# Patient Record
Sex: Female | Born: 1965 | Race: Black or African American | Hispanic: No | Marital: Single | Smoking: Current some day smoker
Health system: Southern US, Community
[De-identification: ages and names within clinical notes are randomized; demographics above are authoritative.]

## PROBLEM LIST (undated history)

## (undated) DIAGNOSIS — D259 Leiomyoma of uterus, unspecified: Secondary | ICD-10-CM

## (undated) DIAGNOSIS — I7100 Dissection of unspecified site of aorta: Secondary | ICD-10-CM

## (undated) DIAGNOSIS — I5032 Chronic diastolic (congestive) heart failure: Secondary | ICD-10-CM

## (undated) DIAGNOSIS — I10 Essential (primary) hypertension: Secondary | ICD-10-CM

## (undated) HISTORY — DX: Chronic diastolic (congestive) heart failure: I50.32

## (undated) HISTORY — DX: Dissection of unspecified site of aorta: I71.00

---

## 2014-11-03 ENCOUNTER — Emergency Department (HOSPITAL_COMMUNITY): Payer: Medicaid - Out of State

## 2014-11-03 ENCOUNTER — Encounter (HOSPITAL_COMMUNITY): Payer: Self-pay | Admitting: Nurse Practitioner

## 2014-11-03 ENCOUNTER — Emergency Department (HOSPITAL_COMMUNITY): Payer: Medicaid - Out of State | Admitting: Anesthesiology

## 2014-11-03 ENCOUNTER — Encounter (HOSPITAL_COMMUNITY)
Admission: EM | Disposition: A | Discharge status: Home / Self Care | Payer: 59 | Source: Home / Self Care | Attending: Cardiothoracic Surgery

## 2014-11-03 ENCOUNTER — Inpatient Hospital Stay (HOSPITAL_COMMUNITY)
Admission: EM | Admit: 2014-11-03 | Discharge: 2014-11-12 | DRG: 220 | Disposition: A | Discharge status: Home / Self Care | Payer: Medicaid - Out of State | Attending: Cardiothoracic Surgery | Admitting: Cardiothoracic Surgery

## 2014-11-03 DIAGNOSIS — D696 Thrombocytopenia, unspecified: Secondary | ICD-10-CM | POA: Diagnosis present

## 2014-11-03 DIAGNOSIS — I7101 Dissection of thoracic aorta: Secondary | ICD-10-CM

## 2014-11-03 DIAGNOSIS — I711 Thoracic aortic aneurysm, ruptured: Secondary | ICD-10-CM

## 2014-11-03 DIAGNOSIS — F17210 Nicotine dependence, cigarettes, uncomplicated: Secondary | ICD-10-CM | POA: Diagnosis present

## 2014-11-03 DIAGNOSIS — I7100 Dissection of unspecified site of aorta: Secondary | ICD-10-CM

## 2014-11-03 DIAGNOSIS — E119 Type 2 diabetes mellitus without complications: Secondary | ICD-10-CM | POA: Diagnosis present

## 2014-11-03 DIAGNOSIS — R109 Unspecified abdominal pain: Secondary | ICD-10-CM

## 2014-11-03 DIAGNOSIS — Z95828 Presence of other vascular implants and grafts: Secondary | ICD-10-CM

## 2014-11-03 DIAGNOSIS — Z6841 Body Mass Index (BMI) 40.0 and over, adult: Secondary | ICD-10-CM

## 2014-11-03 DIAGNOSIS — I7102 Dissection of abdominal aorta: Secondary | ICD-10-CM | POA: Diagnosis not present

## 2014-11-03 DIAGNOSIS — D259 Leiomyoma of uterus, unspecified: Secondary | ICD-10-CM | POA: Diagnosis present

## 2014-11-03 DIAGNOSIS — R1900 Intra-abdominal and pelvic swelling, mass and lump, unspecified site: Secondary | ICD-10-CM | POA: Diagnosis present

## 2014-11-03 DIAGNOSIS — R52 Pain, unspecified: Secondary | ICD-10-CM

## 2014-11-03 DIAGNOSIS — Z8249 Family history of ischemic heart disease and other diseases of the circulatory system: Secondary | ICD-10-CM

## 2014-11-03 DIAGNOSIS — E8770 Fluid overload, unspecified: Secondary | ICD-10-CM | POA: Diagnosis not present

## 2014-11-03 DIAGNOSIS — D62 Acute posthemorrhagic anemia: Secondary | ICD-10-CM | POA: Diagnosis not present

## 2014-11-03 DIAGNOSIS — I71019 Dissection of thoracic aorta, unspecified: Secondary | ICD-10-CM | POA: Diagnosis present

## 2014-11-03 DIAGNOSIS — I10 Essential (primary) hypertension: Secondary | ICD-10-CM | POA: Diagnosis present

## 2014-11-03 DIAGNOSIS — K5900 Constipation, unspecified: Secondary | ICD-10-CM | POA: Diagnosis not present

## 2014-11-03 DIAGNOSIS — J9811 Atelectasis: Secondary | ICD-10-CM | POA: Diagnosis not present

## 2014-11-03 DIAGNOSIS — Z9114 Patient's other noncompliance with medication regimen: Secondary | ICD-10-CM | POA: Diagnosis present

## 2014-11-03 HISTORY — DX: Leiomyoma of uterus, unspecified: D25.9

## 2014-11-03 HISTORY — PX: REPLACEMENT ASCENDING AORTA: SHX6068

## 2014-11-03 HISTORY — DX: Essential (primary) hypertension: I10

## 2014-11-03 LAB — URINALYSIS, ROUTINE W REFLEX MICROSCOPIC
Bilirubin Urine: NEGATIVE
Glucose, UA: NEGATIVE mg/dL
Ketones, ur: NEGATIVE mg/dL
LEUKOCYTES UA: NEGATIVE
Nitrite: NEGATIVE
PROTEIN: 30 mg/dL — AB
Specific Gravity, Urine: 1.02 (ref 1.005–1.030)
UROBILINOGEN UA: 0.2 mg/dL (ref 0.0–1.0)
pH: 7 (ref 5.0–8.0)

## 2014-11-03 LAB — HEMOGLOBIN AND HEMATOCRIT, BLOOD
HCT: 27 % — ABNORMAL LOW (ref 36.0–46.0)
Hemoglobin: 9.2 g/dL — ABNORMAL LOW (ref 12.0–15.0)

## 2014-11-03 LAB — CBC WITH DIFFERENTIAL/PLATELET
Basophils Absolute: 0 10*3/uL (ref 0.0–0.1)
Basophils Relative: 0 % (ref 0–1)
EOS ABS: 0 10*3/uL (ref 0.0–0.7)
EOS PCT: 0 % (ref 0–5)
HCT: 38.1 % (ref 36.0–46.0)
Hemoglobin: 12.9 g/dL (ref 12.0–15.0)
LYMPHS ABS: 1.1 10*3/uL (ref 0.7–4.0)
Lymphocytes Relative: 10 % — ABNORMAL LOW (ref 12–46)
MCH: 29.4 pg (ref 26.0–34.0)
MCHC: 33.9 g/dL (ref 30.0–36.0)
MCV: 86.8 fL (ref 78.0–100.0)
Monocytes Absolute: 0.4 10*3/uL (ref 0.1–1.0)
Monocytes Relative: 4 % (ref 3–12)
Neutro Abs: 9.5 10*3/uL — ABNORMAL HIGH (ref 1.7–7.7)
Neutrophils Relative %: 86 % — ABNORMAL HIGH (ref 43–77)
Platelets: 297 10*3/uL (ref 150–400)
RBC: 4.39 MIL/uL (ref 3.87–5.11)
RDW: 16.3 % — ABNORMAL HIGH (ref 11.5–15.5)
WBC: 11 10*3/uL — ABNORMAL HIGH (ref 4.0–10.5)

## 2014-11-03 LAB — COMPREHENSIVE METABOLIC PANEL
ALK PHOS: 74 U/L (ref 39–117)
ALT: 14 U/L (ref 0–35)
ANION GAP: 13 (ref 5–15)
AST: 17 U/L (ref 0–37)
Albumin: 4.2 g/dL (ref 3.5–5.2)
BILIRUBIN TOTAL: 0.3 mg/dL (ref 0.3–1.2)
BUN: 16 mg/dL (ref 6–23)
CHLORIDE: 103 mmol/L (ref 96–112)
CO2: 22 mmol/L (ref 19–32)
Calcium: 9.3 mg/dL (ref 8.4–10.5)
Creatinine, Ser: 1.09 mg/dL (ref 0.50–1.10)
GFR calc Af Amer: 68 mL/min — ABNORMAL LOW (ref >90)
GFR, EST NON AFRICAN AMERICAN: 59 mL/min — AB (ref >90)
Glucose, Bld: 149 mg/dL — ABNORMAL HIGH (ref 70–99)
POTASSIUM: 3.2 mmol/L — AB (ref 3.5–5.1)
Sodium: 138 mmol/L (ref 135–145)
Total Protein: 7.8 g/dL (ref 6.0–8.3)

## 2014-11-03 LAB — URINE MICROSCOPIC-ADD ON

## 2014-11-03 LAB — FIBRINOGEN: Fibrinogen: 201 mg/dL — ABNORMAL LOW (ref 204–475)

## 2014-11-03 LAB — I-STAT CHEM 8, ED
BUN: 25 mg/dL — AB (ref 6–23)
CHLORIDE: 100 mmol/L (ref 96–112)
Calcium, Ion: 1.18 mmol/L (ref 1.12–1.23)
Creatinine, Ser: 0.9 mg/dL (ref 0.50–1.10)
Glucose, Bld: 144 mg/dL — ABNORMAL HIGH (ref 70–99)
HCT: 43 % (ref 36.0–46.0)
Hemoglobin: 14.6 g/dL (ref 12.0–15.0)
POTASSIUM: 3.2 mmol/L — AB (ref 3.5–5.1)
SODIUM: 141 mmol/L (ref 135–145)
TCO2: 18 mmol/L (ref 0–100)

## 2014-11-03 LAB — I-STAT CG4 LACTIC ACID, ED: Lactic Acid, Venous: 2.3 mmol/L (ref 0.5–2.0)

## 2014-11-03 LAB — I-STAT TROPONIN, ED: Troponin i, poc: 0.01 ng/mL (ref 0.00–0.08)

## 2014-11-03 LAB — PREPARE RBC (CROSSMATCH)

## 2014-11-03 LAB — ABO/RH: ABO/RH(D): B POS

## 2014-11-03 LAB — PLATELET COUNT: Platelets: 152 10*3/uL (ref 150–400)

## 2014-11-03 LAB — LIPASE, BLOOD: Lipase: 41 U/L (ref 11–59)

## 2014-11-03 SURGERY — REPLACEMENT, AORTA, ASCENDING
Anesthesia: General | Site: Chest

## 2014-11-03 MED ORDER — NITROGLYCERIN IN D5W 200-5 MCG/ML-% IV SOLN
INTRAVENOUS | Status: DC | PRN
  Administered 2014-11-03: 5 ug/min via INTRAVENOUS

## 2014-11-03 MED ORDER — LIDOCAINE HCL (CARDIAC) 20 MG/ML IV SOLN
INTRAVENOUS | Status: DC | PRN
  Administered 2014-11-03: 50 mg via INTRAVENOUS

## 2014-11-03 MED ORDER — SODIUM CHLORIDE 0.9 % IV SOLN
10.0000 g | INTRAVENOUS | Status: DC | PRN
  Administered 2014-11-03: 5 g/h via INTRAVENOUS

## 2014-11-03 MED ORDER — DOPAMINE-DEXTROSE 3.2-5 MG/ML-% IV SOLN
0.0000 ug/kg/min | INTRAVENOUS | Status: DC
  Filled 2014-11-03: qty 250

## 2014-11-03 MED ORDER — DEXTROSE 5 % IV SOLN
1.5000 g | INTRAVENOUS | Status: AC
  Administered 2014-11-03: 1.5 g via INTRAVENOUS
  Administered 2014-11-03: .75 g via INTRAVENOUS
  Filled 2014-11-03: qty 1.5

## 2014-11-03 MED ORDER — LACTATED RINGERS IV SOLN
INTRAVENOUS | Status: DC | PRN
  Administered 2014-11-03 (×4): via INTRAVENOUS

## 2014-11-03 MED ORDER — EPHEDRINE SULFATE 50 MG/ML IJ SOLN
INTRAMUSCULAR | Status: AC
  Filled 2014-11-03: qty 1

## 2014-11-03 MED ORDER — LACTATED RINGERS IV SOLN
INTRAVENOUS | Status: DC | PRN
  Administered 2014-11-03 (×2): via INTRAVENOUS

## 2014-11-03 MED ORDER — ROCURONIUM BROMIDE 50 MG/5ML IV SOLN
INTRAVENOUS | Status: AC
  Filled 2014-11-03: qty 5

## 2014-11-03 MED ORDER — LABETALOL HCL 5 MG/ML IV SOLN
2.0000 mg/min | INTRAVENOUS | Status: DC
  Administered 2014-11-03: 2 mg/min via INTRAVENOUS
  Filled 2014-11-03: qty 100

## 2014-11-03 MED ORDER — VECURONIUM BROMIDE 10 MG IV SOLR: INTRAVENOUS | Status: DC | PRN

## 2014-11-03 MED ORDER — IOHEXOL 300 MG/ML  SOLN
100.0000 mL | Freq: Once | INTRAMUSCULAR | Status: AC | PRN
Start: 2014-11-03 — End: 2014-11-03
  Administered 2014-11-03: 100 mL via INTRAVENOUS

## 2014-11-03 MED ORDER — HEPARIN SODIUM (PORCINE) 1000 UNIT/ML IJ SOLN
INTRAMUSCULAR | Status: AC
  Filled 2014-11-03: qty 1

## 2014-11-03 MED ORDER — MIDAZOLAM HCL 10 MG/2ML IJ SOLN
INTRAMUSCULAR | Status: AC
  Filled 2014-11-03: qty 2

## 2014-11-03 MED ORDER — HEPARIN SODIUM (PORCINE) 1000 UNIT/ML IJ SOLN
INTRAMUSCULAR | Status: DC | PRN
  Administered 2014-11-03: 40 mL via INTRAVENOUS

## 2014-11-03 MED ORDER — SODIUM CHLORIDE 0.9 % IV SOLN
INTRAVENOUS | Status: DC
  Filled 2014-11-03: qty 30

## 2014-11-03 MED ORDER — LABETALOL HCL 5 MG/ML IV SOLN
20.0000 mg | Freq: Once | INTRAVENOUS | Status: AC
  Administered 2014-11-03: 20 mg via INTRAVENOUS
  Filled 2014-11-03: qty 4

## 2014-11-03 MED ORDER — IOHEXOL 350 MG/ML SOLN
100.0000 mL | Freq: Once | INTRAVENOUS | Status: AC | PRN
  Administered 2014-11-03: 100 mL via INTRAVENOUS

## 2014-11-03 MED ORDER — MAGNESIUM SULFATE 50 % IJ SOLN
40.0000 meq | INTRAMUSCULAR | Status: DC
  Filled 2014-11-03: qty 10

## 2014-11-03 MED ORDER — VANCOMYCIN HCL 10 G IV SOLR
1500.0000 mg | INTRAVENOUS | Status: AC
  Administered 2014-11-03: 1500 mg via INTRAVENOUS
  Filled 2014-11-03: qty 1500

## 2014-11-03 MED ORDER — ETOMIDATE 2 MG/ML IV SOLN
INTRAVENOUS | Status: AC
  Filled 2014-11-03: qty 10

## 2014-11-03 MED ORDER — HEPARIN SODIUM (PORCINE) 1000 UNIT/ML IJ SOLN
INTRAMUSCULAR | Status: AC
  Filled 2014-11-03: qty 4

## 2014-11-03 MED ORDER — COAGULATION FACTOR VIIA RECOMB 1 MG IV SOLR
45.0000 ug/kg | Freq: Once | INTRAVENOUS | Status: DC
  Filled 2014-11-03: qty 5

## 2014-11-03 MED ORDER — MIDAZOLAM HCL 5 MG/5ML IJ SOLN
INTRAMUSCULAR | Status: DC | PRN
  Administered 2014-11-03: 2 mg via INTRAVENOUS
  Administered 2014-11-03: 0.5 mg via INTRAVENOUS
  Administered 2014-11-03 (×3): 2 mg via INTRAVENOUS
  Administered 2014-11-03: 0.5 mg via INTRAVENOUS
  Administered 2014-11-03: 1 mg via INTRAVENOUS

## 2014-11-03 MED ORDER — EPINEPHRINE HCL 1 MG/ML IJ SOLN
0.0000 ug/min | INTRAVENOUS | Status: DC
  Filled 2014-11-03: qty 4

## 2014-11-03 MED ORDER — SODIUM CHLORIDE 0.9 % IJ SOLN
INTRAMUSCULAR | Status: AC
  Filled 2014-11-03: qty 30

## 2014-11-03 MED ORDER — DEXTROSE 5 % IV SOLN
30.0000 ug/min | INTRAVENOUS | Status: DC
  Filled 2014-11-03: qty 2

## 2014-11-03 MED ORDER — SODIUM CHLORIDE 0.9 % IV SOLN
250.0000 [IU] | INTRAVENOUS | Status: DC | PRN
  Administered 2014-11-03: 1 [IU]/h via INTRAVENOUS

## 2014-11-03 MED ORDER — PROPOFOL 10 MG/ML IV BOLUS
INTRAVENOUS | Status: AC
  Filled 2014-11-03: qty 20

## 2014-11-03 MED ORDER — PROTAMINE SULFATE 10 MG/ML IV SOLN
INTRAVENOUS | Status: AC
  Filled 2014-11-03: qty 25

## 2014-11-03 MED ORDER — DEXTROSE 5 % IV SOLN
750.0000 mg | INTRAVENOUS | Status: DC
  Filled 2014-11-03: qty 750

## 2014-11-03 MED ORDER — FENTANYL CITRATE (PF) 250 MCG/5ML IJ SOLN
INTRAMUSCULAR | Status: AC
  Filled 2014-11-03: qty 5

## 2014-11-03 MED ORDER — PHENYLEPHRINE 40 MCG/ML (10ML) SYRINGE FOR IV PUSH (FOR BLOOD PRESSURE SUPPORT)
PREFILLED_SYRINGE | INTRAVENOUS | Status: AC
  Filled 2014-11-03: qty 10

## 2014-11-03 MED ORDER — HYDROMORPHONE HCL 1 MG/ML IJ SOLN
1.0000 mg | Freq: Once | INTRAMUSCULAR | Status: AC
  Administered 2014-11-03: 1 mg via INTRAVENOUS
  Filled 2014-11-03: qty 1

## 2014-11-03 MED ORDER — SODIUM CHLORIDE 0.9 % IV SOLN
INTRAVENOUS | Status: DC
  Filled 2014-11-03: qty 2.5

## 2014-11-03 MED ORDER — SUCCINYLCHOLINE CHLORIDE 20 MG/ML IJ SOLN
INTRAMUSCULAR | Status: AC
  Filled 2014-11-03: qty 1

## 2014-11-03 MED ORDER — NITROGLYCERIN IN D5W 200-5 MCG/ML-% IV SOLN
2.0000 ug/min | INTRAVENOUS | Status: DC
  Filled 2014-11-03: qty 250

## 2014-11-03 MED ORDER — DOPAMINE-DEXTROSE 3.2-5 MG/ML-% IV SOLN
INTRAVENOUS | Status: DC | PRN
  Administered 2014-11-03: 5 ug/kg/min via INTRAVENOUS

## 2014-11-03 MED ORDER — DEXAMETHASONE SODIUM PHOSPHATE 4 MG/ML IJ SOLN
INTRAMUSCULAR | Status: DC | PRN
  Administered 2014-11-03: 10 mg via INTRAVENOUS

## 2014-11-03 MED ORDER — ROCURONIUM BROMIDE 100 MG/10ML IV SOLN
INTRAVENOUS | Status: DC | PRN
  Administered 2014-11-03: 50 mg via INTRAVENOUS
  Administered 2014-11-03: 100 mg via INTRAVENOUS
  Administered 2014-11-03 (×2): 50 mg via INTRAVENOUS

## 2014-11-03 MED ORDER — STERILE WATER FOR INJECTION IJ SOLN
INTRAMUSCULAR | Status: AC
  Filled 2014-11-03: qty 30

## 2014-11-03 MED ORDER — ALBUMIN HUMAN 5 % IV SOLN
INTRAVENOUS | Status: DC | PRN
  Administered 2014-11-03: 23:00:00 via INTRAVENOUS

## 2014-11-03 MED ORDER — SODIUM CHLORIDE 0.9 % IV SOLN: Freq: Once | INTRAVENOUS | Status: DC

## 2014-11-03 MED ORDER — PROPOFOL 10 MG/ML IV BOLUS
INTRAVENOUS | Status: DC | PRN
  Administered 2014-11-03: 100 mg via INTRAVENOUS

## 2014-11-03 MED ORDER — PROTAMINE SULFATE 10 MG/ML IV SOLN
INTRAVENOUS | Status: AC
  Filled 2014-11-03: qty 10

## 2014-11-03 MED ORDER — HEMOSTATIC AGENTS (NO CHARGE) OPTIME
TOPICAL | Status: DC | PRN
  Administered 2014-11-03: 1 via TOPICAL

## 2014-11-03 MED ORDER — SODIUM CHLORIDE 0.9 % IV SOLN
INTRAVENOUS | Status: DC
  Filled 2014-11-03: qty 40

## 2014-11-03 MED ORDER — PROTAMINE SULFATE 10 MG/ML IV SOLN
INTRAVENOUS | Status: DC | PRN
  Administered 2014-11-03: 350 mg via INTRAVENOUS

## 2014-11-03 MED ORDER — SODIUM CHLORIDE 0.9 % IV SOLN
200.0000 ug | INTRAVENOUS | Status: DC | PRN
  Administered 2014-11-03: 0.2 ug/kg/h via INTRAVENOUS

## 2014-11-03 MED ORDER — VECURONIUM BROMIDE 10 MG IV SOLR
INTRAVENOUS | Status: AC
  Filled 2014-11-03: qty 10

## 2014-11-03 MED ORDER — 0.9 % SODIUM CHLORIDE (POUR BTL) OPTIME
TOPICAL | Status: DC | PRN
  Administered 2014-11-03: 1000 mL

## 2014-11-03 MED ORDER — PHENYLEPHRINE HCL 10 MG/ML IJ SOLN
INTRAMUSCULAR | Status: AC
  Filled 2014-11-03: qty 1

## 2014-11-03 MED ORDER — LIDOCAINE HCL (CARDIAC) 20 MG/ML IV SOLN
INTRAVENOUS | Status: AC
  Filled 2014-11-03: qty 10

## 2014-11-03 MED ORDER — FENTANYL CITRATE (PF) 100 MCG/2ML IJ SOLN
INTRAMUSCULAR | Status: DC | PRN
  Administered 2014-11-03: 250 ug via INTRAVENOUS
  Administered 2014-11-03: 200 ug via INTRAVENOUS
  Administered 2014-11-03 (×2): 250 ug via INTRAVENOUS
  Administered 2014-11-03: 50 ug via INTRAVENOUS
  Administered 2014-11-03 – 2014-11-04 (×3): 250 ug via INTRAVENOUS

## 2014-11-03 MED ORDER — ONDANSETRON HCL 4 MG/2ML IJ SOLN
4.0000 mg | Freq: Once | INTRAMUSCULAR | Status: AC
  Administered 2014-11-03: 4 mg via INTRAVENOUS
  Filled 2014-11-03: qty 2

## 2014-11-03 MED ORDER — ETOMIDATE 2 MG/ML IV SOLN
INTRAVENOUS | Status: DC | PRN
  Administered 2014-11-03 (×2): 10 mg via INTRAVENOUS

## 2014-11-03 MED ORDER — LACTATED RINGERS IV SOLN
INTRAVENOUS | Status: DC | PRN
  Administered 2014-11-03: 18:00:00 via INTRAVENOUS

## 2014-11-03 MED ORDER — IOHEXOL 300 MG/ML  SOLN: 25.0000 mL | Freq: Once | INTRAMUSCULAR | Status: AC | PRN

## 2014-11-03 MED ORDER — SODIUM BICARBONATE 8.4 % IV SOLN
INTRAVENOUS | Status: DC | PRN
  Administered 2014-11-03: 50 meq via INTRAVENOUS

## 2014-11-03 MED ORDER — SUCCINYLCHOLINE CHLORIDE 20 MG/ML IJ SOLN
INTRAMUSCULAR | Status: DC | PRN
  Administered 2014-11-03: 120 mg via INTRAVENOUS

## 2014-11-03 MED ORDER — ARTIFICIAL TEARS OP OINT
TOPICAL_OINTMENT | OPHTHALMIC | Status: AC
  Filled 2014-11-03: qty 3.5

## 2014-11-03 MED ORDER — PAPAVERINE HCL 30 MG/ML IJ SOLN
INTRAMUSCULAR | Status: AC
Start: 2014-11-04 — End: 2014-11-03
  Administered 2014-11-03: 500 mL
  Filled 2014-11-03: qty 2.5

## 2014-11-03 MED ORDER — POTASSIUM CHLORIDE 2 MEQ/ML IV SOLN
80.0000 meq | INTRAVENOUS | Status: DC
  Filled 2014-11-03: qty 40

## 2014-11-03 MED ORDER — SODIUM CHLORIDE 0.9 % IV SOLN
1.0000 g/h | Freq: Once | INTRAVENOUS | Status: DC
  Filled 2014-11-03: qty 20

## 2014-11-03 MED ORDER — DEXMEDETOMIDINE HCL IN NACL 400 MCG/100ML IV SOLN
0.1000 ug/kg/h | INTRAVENOUS | Status: DC
  Filled 2014-11-03: qty 100

## 2014-11-03 SURGICAL SUPPLY — 122 items
ADAPTER CARDIO PERF ANTE/RETRO (ADAPTER) ×3 IMPLANT
BAG DECANTER FOR FLEXI CONT (MISCELLANEOUS) ×3 IMPLANT
BLADE STERNUM SYSTEM 6 (BLADE) ×3 IMPLANT
BLADE SURG 15 STRL LF DISP TIS (BLADE) ×2 IMPLANT
BLADE SURG 15 STRL SS (BLADE) ×1
BLADE SURG ROTATE 9660 (MISCELLANEOUS) IMPLANT
CANISTER SUCTION 2500CC (MISCELLANEOUS) ×3 IMPLANT
CANN PRFSN .5XCNCT 15X34-48 (MISCELLANEOUS)
CANNULA ARTERIAL 007325 (MISCELLANEOUS) IMPLANT
CANNULA ARTERIAL 14F 007324 (MISCELLANEOUS) IMPLANT
CANNULA GRAFT 8MMX50CM (Graft) ×3 IMPLANT
CANNULA GUNDRY RCSP 15FR (MISCELLANEOUS) ×3 IMPLANT
CANNULA PRFSN .5XCNCT 15X34-48 (MISCELLANEOUS) IMPLANT
CANNULA SUMP PERICARDIAL (CANNULA) ×3 IMPLANT
CANNULA VEN 2 STAGE (MISCELLANEOUS)
CATH CPB KIT GERHARDT (MISCELLANEOUS) ×3 IMPLANT
CATH FOLEY 2WAY SLVR 18FR 30CC (CATHETERS) ×3 IMPLANT
CATH HEART VENT LEFT (CATHETERS) ×2 IMPLANT
CATH RETROPLEGIA CORONARY 14FR (CATHETERS) ×3 IMPLANT
CATH THORACIC 28FR (CATHETERS) IMPLANT
CATH/SQUID NICHOLS JEHLE COR (CATHETERS) ×3 IMPLANT
CLIP TI MEDIUM 6 (CLIP) ×3 IMPLANT
CLIP TI WIDE RED SMALL 24 (CLIP) IMPLANT
CLIP TI WIDE RED SMALL 6 (CLIP) IMPLANT
CONN 1/2X1/2X1/2  BEN (MISCELLANEOUS)
CONN 1/2X1/2X1/2 BEN (MISCELLANEOUS) IMPLANT
CONN 3/8X3/8 GISH STERILE (MISCELLANEOUS) IMPLANT
CONN Y 3/8X3/8X3/8  BEN (MISCELLANEOUS)
CONN Y 3/8X3/8X3/8 BEN (MISCELLANEOUS) IMPLANT
CRADLE DONUT ADULT HEAD (MISCELLANEOUS) IMPLANT
DRAIN CHANNEL 15F RND FF W/TCR (WOUND CARE) IMPLANT
DRAIN CHANNEL 19F RND (DRAIN) IMPLANT
DRAIN CHANNEL 28F RND 3/8 FF (WOUND CARE) ×3 IMPLANT
DRAIN SNY 10X20 3/4 PERF (WOUND CARE) IMPLANT
DRAIN WOUND SNY 15 RND (WOUND CARE) IMPLANT
DRESSING AQUACEL AQ EXTRA 4X5 (GAUZE/BANDAGES/DRESSINGS) ×3 IMPLANT
DRSG AQUACEL AG ADV 3.5X14 (GAUZE/BANDAGES/DRESSINGS) ×3 IMPLANT
ELECT BLADE 4.0 EZ CLEAN MEGAD (MISCELLANEOUS) ×3
ELECT CAUTERY BLADE 6.4 (BLADE) ×3 IMPLANT
ELECT REM PT RETURN 9FT ADLT (ELECTROSURGICAL) ×6
ELECTRODE BLDE 4.0 EZ CLN MEGD (MISCELLANEOUS) ×2 IMPLANT
ELECTRODE REM PT RTRN 9FT ADLT (ELECTROSURGICAL) ×4 IMPLANT
EVACUATOR SILICONE 100CC (DRAIN) IMPLANT
FELT TEFLON 6X6 (MISCELLANEOUS) IMPLANT
GAUZE SPONGE 4X4 12PLY STRL (GAUZE/BANDAGES/DRESSINGS) ×6 IMPLANT
GLOVE BIO SURGEON STRL SZ 6.5 (GLOVE) ×9 IMPLANT
GOWN STRL REUS W/ TWL LRG LVL3 (GOWN DISPOSABLE) ×8 IMPLANT
GOWN STRL REUS W/TWL LRG LVL3 (GOWN DISPOSABLE) ×4
GRAFT 4 BRANCH 30X50 (Prosthesis & Implant Heart) ×3 IMPLANT
GUIDEWIRE PRIMEWIRE PRESTIGE (WIRE) ×3 IMPLANT
HANDLE STAPLE ENDO GIA SHORT (STAPLE) ×1
HEMOSTAT POWDER SURGIFOAM 1G (HEMOSTASIS) IMPLANT
HEMOSTAT SNOW SURGICEL 2X4 (HEMOSTASIS) ×3 IMPLANT
HEMOSTAT SURGICEL 2X14 (HEMOSTASIS) IMPLANT
HEMOSTAT SURGICEL 2X4 FIBR (HEMOSTASIS) ×3 IMPLANT
INSERT FOGARTY SM (MISCELLANEOUS) ×3 IMPLANT
INSERT FOGARTY XLG (MISCELLANEOUS) ×3 IMPLANT
KIT BASIN OR (CUSTOM PROCEDURE TRAY) ×3 IMPLANT
KIT ROOM TURNOVER OR (KITS) ×3 IMPLANT
KIT SUCTION CATH 14FR (SUCTIONS) IMPLANT
LEAD PACING MYOCARDI (MISCELLANEOUS) ×3 IMPLANT
NEEDLE AORTIC AIR ASPIRATING (NEEDLE) ×3 IMPLANT
NS IRRIG 1000ML POUR BTL (IV SOLUTION) ×12 IMPLANT
PACK OPEN HEART (CUSTOM PROCEDURE TRAY) ×3 IMPLANT
PAD ARMBOARD 7.5X6 YLW CONV (MISCELLANEOUS) ×6 IMPLANT
RELOAD ENDO GIA 30 3.5 (STAPLE) ×3 IMPLANT
SEALANT SURG COSEAL 8ML (VASCULAR PRODUCTS) ×6 IMPLANT
SPONGE GAUZE 4X4 12PLY STER LF (GAUZE/BANDAGES/DRESSINGS) ×3 IMPLANT
SPONGE LAP 18X18 X RAY DECT (DISPOSABLE) ×3 IMPLANT
SPONGE LAP 4X18 X RAY DECT (DISPOSABLE) IMPLANT
STAPLER ENDO GIA 12MM SHORT (STAPLE) ×2 IMPLANT
STAPLER VISISTAT 35W (STAPLE) IMPLANT
STOPCOCK 4 WAY LG BORE MALE ST (IV SETS) IMPLANT
STRIP CLOSURE SKIN 1/2X4 (GAUZE/BANDAGES/DRESSINGS) IMPLANT
SURGIFLO W/THROMBIN 8M KIT (HEMOSTASIS) ×3 IMPLANT
SUT ETHIBOND 2 0 SH (SUTURE) ×4
SUT ETHIBOND 2 0 SH 36X2 (SUTURE) ×8 IMPLANT
SUT MNCRL AB 3-0 PS2 18 (SUTURE) IMPLANT
SUT PROLENE 3 0 RB 1 (SUTURE) ×3 IMPLANT
SUT PROLENE 3 0 SH 1 (SUTURE) ×3 IMPLANT
SUT PROLENE 3 0 SH DA (SUTURE) ×3 IMPLANT
SUT PROLENE 3 0 SH1 36 (SUTURE) ×30 IMPLANT
SUT PROLENE 4 0 RB 1 (SUTURE) ×10
SUT PROLENE 4 0 SH DA (SUTURE) IMPLANT
SUT PROLENE 4 0 TF (SUTURE) ×6 IMPLANT
SUT PROLENE 4-0 RB1 .5 CRCL 36 (SUTURE) ×20 IMPLANT
SUT PROLENE 5 0 C 1 36 (SUTURE) ×6 IMPLANT
SUT PROLENE 6 0 CC (SUTURE) IMPLANT
SUT PROLENE 7 0 BV 1 (SUTURE) IMPLANT
SUT PROLENE 7 0 BV1 MDA (SUTURE) IMPLANT
SUT SILK 1 TIES 10X30 (SUTURE) ×3 IMPLANT
SUT SILK 2 0 SH CR/8 (SUTURE) ×6 IMPLANT
SUT SILK 2 0 TIES 17X18 (SUTURE) ×1
SUT SILK 2-0 18XBRD TIE BLK (SUTURE) ×2 IMPLANT
SUT SILK 3 0 SH CR/8 (SUTURE) ×3 IMPLANT
SUT SILK 4 0 TIES 17X18 (SUTURE) ×3 IMPLANT
SUT STEEL 6MS V (SUTURE) ×3 IMPLANT
SUT STEEL STERNAL CCS#1 18IN (SUTURE) IMPLANT
SUT STEEL SZ 6 DBL 3X14 BALL (SUTURE) IMPLANT
SUT TEM PAC WIRE 2 0 SH (SUTURE) ×6 IMPLANT
SUT VIC AB 1 CT1 18XCR BRD 8 (SUTURE) IMPLANT
SUT VIC AB 1 CT1 8-18 (SUTURE)
SUT VIC AB 1 CTX 18 (SUTURE) ×6 IMPLANT
SUT VIC AB 1 CTX 27 (SUTURE) ×6 IMPLANT
SUT VIC AB 2-0 CT1 27 (SUTURE)
SUT VIC AB 2-0 CT1 TAPERPNT 27 (SUTURE) IMPLANT
SUT VIC AB 2-0 CTX 36 (SUTURE) ×3 IMPLANT
SUT VIC AB 3-0 SH 27 (SUTURE)
SUT VIC AB 3-0 SH 27X BRD (SUTURE) IMPLANT
SUT VIC AB 3-0 X1 27 (SUTURE) IMPLANT
SUT VICRYL 4-0 PS2 18IN ABS (SUTURE) IMPLANT
SUTURE E-PAK OPEN HEART (SUTURE) IMPLANT
SYR 10ML KIT SKIN ADHESIVE (MISCELLANEOUS) ×3 IMPLANT
SYSTEM SAHARA CHEST DRAIN ATS (WOUND CARE) ×3 IMPLANT
TOWEL OR 17X24 6PK STRL BLUE (TOWEL DISPOSABLE) ×3 IMPLANT
TOWEL OR 17X26 10 PK STRL BLUE (TOWEL DISPOSABLE) ×3 IMPLANT
TRAY CATH LUMEN 1 20CM STRL (SET/KITS/TRAYS/PACK) IMPLANT
TUBE CONNECTING 12X1/4 (SUCTIONS) ×3 IMPLANT
TUBE FEEDING 8FR 16IN STR KANG (MISCELLANEOUS) ×3 IMPLANT
VENT LEFT HEART 12002 (CATHETERS) ×3
WATER STERILE IRR 1000ML POUR (IV SOLUTION) ×6 IMPLANT
YANKAUER SUCT BULB TIP NO VENT (SUCTIONS) ×3 IMPLANT

## 2014-11-03 NOTE — ED Notes (Signed)
PT returned from scans. Pt monitored by pulse ox, bp cuff, and 12-lead.

## 2014-11-03 NOTE — ED Notes (Signed)
MD at bedside. 

## 2014-11-03 NOTE — ED Notes (Signed)
Pt requesting to use bathroom, this RN noted pt pulled out IV in hand. Hand bandaged up and bleeding controlled. Pt also ambulated to restroom with no difficulty, steady gait.

## 2014-11-03 NOTE — H&P (Signed)
[address]       [place] [address]             [phone]        Charlyne O[place]ne [place] Medical Record #[id number] Date of Birth: 1966-06-16  Referring:[facility] Primary Care: Pcp Not In System  Chief Complaint:    Chief Complaint  Patient presents with  . Abdominal Pain    History of Present Illness:      Patient is a 49 year old hypertensive female, who approximate 6 months ago stopped taking antihypertensive medication. She awoke this morning with anterior chest pain dizziness and lightheadedness the discomfort quickly moved to her back and into her abdomen.  She noted nausea and came to the emergency room at 12:30 PM today. A CT scan of the abdomen was performed and demonstrated abdominal aortic dissection and a large intra-abdominal pelvic mass. The patient was then returned to the scanner and just approximately 20 minutes ago CTA scan of the chest was completed demonstrating type I aortic dissection starting just above the sinotubular ridge. Patient has a normal takeoff of the vessels of the arch.  In the emergency room on exam the patient is awake alert and conversant. She denies any lower extremity pain or weakness.   Current Activity/ Functional Status: Patient is independent with mobility/ambulation, transfers, ADL's, IADL's.   Zubrod Score: At the time of surgery this patient's most appropriate activity status/level should be described as: [x]     0    Normal activity, no symptoms []     1    Restricted in physical strenuous activity but ambulatory, able to do out light work []     2    Ambulatory and capable of self care, unable to do work activities, up and about                 more than 50%  Of the time                            []     3    Only limited self care, in bed greater than 50% of waking hours []     4    Completely disabled, no self care, confined to bed or chair []     5    Moribund  Past Medical History  Diagnosis  Date  . Hypertension   . Fibroid uterus     History reviewed. No pertinent past surgical history.  History  Smoking status  . Current Every Day Smoker -- 1.00 packs/day  . Types: Cigarettes  Smokeless tobacco  . Not on file   History  Alcohol Use  . Yes    Comment: occasional    History   Social History  . Marital Status: Single    Spouse Name: N/A  . Number of Children: N/A  . Years of Education: N/A         Social History Main Topics  . Smoking status: Current Every Day Smoker -- 1.00 packs/day    Types: Cigarettes  . Smokeless tobacco: Not on file  . Alcohol Use: Yes     Comment: occasional  . Drug Use: Yes    Special: Marijuana  . Sexual Activity: Not Currently          Family history: No history of aortic dissection in the family, patient's father is here with her notes that he has hypertension She has brothers with  history of hypertension, no history of dissection   No Known Allergies  Current Facility-Administered Medications  Medication Dose Route Frequency Provider Last Rate Last Dose  . [START ON 11/04/2014] aminocaproic acid (AMICAR) 10 g in sodium chloride 0.9 % 100 mL infusion   Intravenous To OR [name], MD      . Derrill Memo ON 11/04/2014] cefUROXime (ZINACEF) 1.5 g in dextrose 5 % 50 mL IVPB  1.5 g Intravenous To OR [name], MD      . Derrill Memo ON 11/04/2014] cefUROXime (ZINACEF) 750 mg in dextrose 5 % 50 mL IVPB  750 mg Intravenous To OR [name], MD      . Derrill Memo ON 11/04/2014] dexmedetomidine (PRECEDEX) 400 MCG/100ML (4 mcg/mL) infusion  0.1-0.7 mcg/kg/hr Intravenous To OR [name], MD      . Derrill Memo ON 11/04/2014] DOPamine (INTROPIN) 800 mg in dextrose 5 % 250 mL (3.2 mg/mL) infusion  0-10 mcg/kg/min Intravenous To OR [name], MD      . Derrill Memo ON 11/04/2014] EPINEPHrine (ADRENALIN) 4 mg in dextrose 5 % 250 mL (0.016 mg/mL) infusion  0-10 mcg/min Intravenous To OR [name], MD      . Derrill Memo ON 11/04/2014]  heparin 2,500 Units, papaverine 30 mg in electrolyte-148 (PLASMALYTE-148) 500 mL irrigation   Irrigation To OR [name], MD      . Derrill Memo ON 11/04/2014] heparin 30,000 units/NS 1000 mL solution for CELLSAVER   Other To OR [name], MD      . Derrill Memo ON 11/04/2014] insulin regular (NOVOLIN R,HUMULIN R) 250 Units in sodium chloride 0.9 % 250 mL (1 Units/mL) infusion   Intravenous To OR [name], MD      . iohexol (OMNIPAQUE) 300 MG/ML solution 25 mL  25 mL Oral Once PRN Medication Radiologist, MD      . labetalol (NORMODYNE,TRANDATE) 500 mg in dextrose 5 % 125 mL (4 mg/mL) infusion  2 mg/min Intravenous Titrated Davonna Belling, MD 30 mL/hr at 11/03/14 1640 2 mg/min at 11/03/14 1640  . [START ON 11/04/2014] magnesium sulfate (IV Push/IM) injection 40 mEq  40 mEq Other To OR [name], MD      . Derrill Memo ON 11/04/2014] nitroGLYCERIN 50 mg in dextrose 5 % 250 mL (0.2 mg/mL) infusion  2-200 mcg/min Intravenous To OR [name], MD      . Derrill Memo ON 11/04/2014] phenylephrine (NEO-SYNEPHRINE) 20 mg in dextrose 5 % 250 mL (0.08 mg/mL) infusion  30-200 mcg/min Intravenous To OR [name], MD      . Derrill Memo ON 11/04/2014] potassium chloride injection 80 mEq  80 mEq Other To OR [name], MD      . Derrill Memo ON 11/04/2014] vancomycin (VANCOCIN) 1,500 mg in sodium chloride 0.9 % 250 mL IVPB  1,500 mg Intravenous To OR [name], MD       Current Outpatient Prescriptions  Medication Sig Dispense Refill  . acetaminophen (TYLENOL) 325 MG tablet Take 650 mg by mouth every 6 (six) hours as needed for mild pain.       (Not in a hospital admission)  No family history on file.   Review of Systems:      Cardiac Review of Systems: Y or N  Chest Pain [  y  ]  Resting SOB [n   ] Exertional SOB  [  n]  Orthopnea [ n ]   Pedal Edema [ n  ]  Palpitations [ n ] Syncope  [ n ]   Presyncope [ y  ]  General Review of Systems: [Y] = yes [  ]=no Constitional: recent weight  change [n  ]; anorexia [  ]; fatigue [  ]; nausea [  ]; night sweats [  ]; fever [  ]; or chills [  ]                                                               Dental: poor dentition[  ]; Last Dentist visit:   Eye : blurred vision [  ]; diplopia [   ]; vision changes [  ];  Amaurosis fugax[  ]; Resp: cough [  ];  wheezing[  ];  hemoptysis[  ]; shortness of breath[  ]; paroxysmal nocturnal dyspnea[  ]; dyspnea on exertion[  ]; or orthopnea[  ];  GI:  gallstones[  ], vomiting[  ];  dysphagia[  ]; melena[  ];  hematochezia [  ]; heartburn[  ];   Hx of  Colonoscopy[  ]; GU: kidney stones [  ]; hematuria[  ];   dysuria [  ];  nocturia[  ];  history of     obstruction [  ]; urinary frequency [  ]             Skin: rash, swelling[  ];, hair loss[  ];  peripheral edema[  ];  or itching[  ]; Musculosketetal: myalgias[  ];  joint swelling[  ];  joint erythema[  ];  joint pain[  ];  back pain[  ];  Heme/Lymph: bruising[  ];  bleeding[  ];  anemia[  ];  Neuro: TIA[  ];  headaches[  ];  stroke[  ];  vertigo[  ];  seizures[  ];   paresthesias[  ];  difficulty walking[n  ];  Psych:depression[  ]; anxiety[  ];  Endocrine: diabetes[ n ];  thyroid dysfunction[n  ];  Immunizations: Flu [  ]; Pneumococcal[  ];  Other:  Physical Exam: BP 169/94 mmHg  Pulse 66  Temp(Src) 96 F (35.6 C) (Rectal)  Resp 19  Ht 5' (1.524 m)  Wt 256 lb (116.121 kg)  BMI 50.00 kg/m2  SpO2 97%  LMP 10/29/2014   General appearance: alert, cooperative and appears older than stated age Head: Normocephalic, without obvious abnormality, atraumatic Neck: no adenopathy, no carotid bruit, no JVD, supple, symmetrical, trachea midline and thyroid not enlarged, symmetric, no tenderness/mass/nodules Lymph nodes: Cervical, supraclavicular, and axillary nodes normal. Resp: diminished breath sounds bibasilar Back: symmetric, no curvature. ROM normal. No CVA tenderness. Cardio: regular rate and rhythm, S1, S2 normal, no murmur, click, rub  or gallop GI: Obese abdomen hard to evaluate, even with known abdominal mass Extremities: extremities normal, atraumatic, no cyanosis or edema and Homans sign is negative, no sign of DVT Neurologic: Grossly normal Patient is able to move the lower extremities She has palpable radial and brachial femoral and pedal pulses bilaterally that are equal  Diagnostic Studies & Laboratory data:     Recent Radiology Findings:   Ct Abdomen Pelvis W Contrast  11/03/2014   CLINICAL DATA:  Abdominal pain.  EXAM: CT ABDOMEN AND PELVIS WITH CONTRAST  TECHNIQUE: Multidetector CT imaging of the abdomen and pelvis was performed using the standard  protocol following bolus administration of intravenous contrast.  CONTRAST:  17mL OMNIPAQUE IOHEXOL 300 MG/ML  SOLN  COMPARISON:  None.  FINDINGS: Lower chest: The lung bases are unremarkable.  Heart size is normal.  Upper abdomen: No focal abnormality identified within the liver, spleen, pancreas, or adrenal glands. There are extrarenal pelves bilaterally. There is slight delay in enhancement of the right kidney, possibly related to delayed arterial inflow. See below.  Gastrointestinal tract: The stomach and small bowel loops are nondilated the compressed primarily within the right upper quadrant. Colonic loops are normal in appearance. The appendix is not well seen but may be obscured or absent.  Pelvis: Extending from the pelvis into the abdomen there is a large heterogeneous solid mass extending out superior portion of the uterus and measuring at least 27 x 27 x 13.4 cm. The urinary bladder is decompressed but has a thickened wall. No definite ovarian mass identified. There is no free pelvic fluid.  Retroperitoneum: There is an abdominal aortic dissection which begins at the upper most cuts of the study and extends into the left iliac artery. Dissection flap extends into the celiac axis and superior mesenteric artery. Is difficult to assess the integrity of the renal arteries  given the contrast bolus which was performed for general abdominal imaging. Contrast is identified within the inferior mesenteric artery.  Abdominal wall: Unremarkable.  Osseous structures: There are moderate degenerative changes in the lower thoracic and lower lumbar spine. No suspicious lytic or blastic lesions are identified.  IMPRESSION: 1. Abdominal aortic dissection extending into the celiac axis, superior mesenteric artery, and left iliac artery. Because of the timing of the contrast bolus, is difficult to evaluate integrity of the abdominal vessels. 2. Consider CT angiography of the chest and abdomen for further characterization. 3. There is probable delayed nephrogram right kidney, raising suspicion for vascular compromise right renal artery. 4. Large pelvic mass extending from the fundus of the uterus consistent with large uterine fibroids, measuring at least 27 cm. 5. Critical Value/emergent results were called by telephone at the time of interpretation on 11/03/2014 at 2:56 pm to Dr. Davonna Belling , who verbally acknowledged these results.   Electronically Signed   By: Nolon Nations M.D.   On: 11/03/2014 15:01   Dg Abd Acute W/chest  11/03/2014   CLINICAL DATA:  Generalized abdominal pain.  Diarrhea.  EXAM: DG ABDOMEN ACUTE W/ 1V CHEST  COMPARISON:  None.  FINDINGS: There is no evidence of dilated bowel loops or free intraperitoneal air. No radiopaque calculi identified. Lower lumbar spine degenerative changes noted.  Heart size and mediastinal contours are within normal limits. Mild tortuosity of thoracic aorta is noted. Both lungs are clear. No evidence of pneumothorax or pleural effusion.  IMPRESSION: Normal bowel gas pattern.  No active cardiopulmonary disease.   Electronically Signed   By: Earle Gell M.D.   On: 11/03/2014 16:49    formal reading pending at time of note, reviewed with Dr Shanda Bumps radiology I have independently reviewed the above radiologic studies.  Recent Lab  Findings: Lab Results  Component Value Date   WBC 11.0* 11/03/2014   HGB 14.6 11/03/2014   HCT 43.0 11/03/2014   PLT 297 11/03/2014   GLUCOSE 144* 11/03/2014   ALT 14 11/03/2014   AST 17 11/03/2014   NA 141 11/03/2014   K 3.2* 11/03/2014   CL 100 11/03/2014   CREATININE 0.90 11/03/2014   BUN 25* 11/03/2014   CO2 22 11/03/2014      Assessment /  Plan:      #1/ type I aortic dissection starting just above the aortic valve extending through the arch and descending aorta into the left common iliac artery, with numerous fenestrations and equal perfusion of both kidneys. No aortic insufficiency on exam 2/ history of poorly controlled hypertension  I have discussed with the patient the diagnosis of acute ascending aortic dissection and the need for repair urgently and possibly replacement of aortic valve. She is aware that lifelong Coumadin will be required if a mechanical valve is used. Risks and options are discussed.  The goals risks and alternatives of the planned surgical procedure  repair of ascending aortic dissection and possible aortic valve replacement  been discussed with the patient in detail. The risks of the procedure including death, infection, stroke, myocardial infarction, bleeding, blood transfusion have all been discussed specifically.  I have quoted [employer] a 15 % of perioperative mortality and a complication rate as high as 40 %. The patient's questions have been answered.Davey Smoot is willing  to proceed with the planned procedure.   [name] MD      [address] [place] [address] Office [phone]   Beeper [phone]  11/03/2014 5:12 PM

## 2014-11-03 NOTE — ED Notes (Signed)
Pt returned from scans. Monitored by pulse ox, bp cuff, and 12-lead. 

## 2014-11-03 NOTE — ED Notes (Signed)
Patients caregiver Benjamine Mola wanted to leave her name and number to be contacted after the surgery, [phone] or [phone]

## 2014-11-03 NOTE — Anesthesia Procedure Notes (Signed)
Procedure Name: Intubation Date/Time: 11/03/2014 6:59 PM Performed by: Maude Leriche D Pre-anesthesia Checklist: Patient identified, Emergency Drugs available, Suction available, Patient being monitored and Timeout performed Patient Re-evaluated:Patient Re-evaluated prior to inductionOxygen Delivery Method: Circle system utilized Preoxygenation: Pre-oxygenation with 100% oxygen Intubation Type: IV induction, Rapid sequence and Cricoid Pressure applied Laryngoscope Size: Glidescope (Elective Glidescope) Grade View: Grade I Tube type: Subglottic suction tube Tube size: 8.0 mm Number of attempts: 1 Airway Equipment and Method: Stylet and Video-laryngoscopy Placement Confirmation: ETT inserted through vocal cords under direct vision,  positive ETCO2 and breath sounds checked- equal and bilateral Secured at: 21 cm Tube secured with: Tape Dental Injury: Teeth and Oropharynx as per pre-operative assessment

## 2014-11-03 NOTE — ED Notes (Signed)
Surgery called, requesting patient to be transferred to surgery holding at this time

## 2014-11-03 NOTE — Progress Notes (Signed)
Echocardiogram Echocardiogram Transesophageal has been performed.  Joelene Millin 11/03/2014, 6:25 PM

## 2014-11-03 NOTE — Consult Note (Addendum)
[facility] [place] [facility] HISTORY AND PHYSICAL   History of Present Illness:  Patient is a 49 y.o. year old female who presents for evaluation of acute aortic dissection. Pt experienced acute onset back pain which then quickly migrated to her chest and into the abdomen followed by nausea.  She states the chest and back pain have resolved.  She still has some mild diffuse abdominal pain.  She denies any weakness numbness or coolness in arms or legs.  She states as far as she knows her kidneys work normally.  She has a history of hypertension but says "it went away" and she stopped her meds 6 mo ago. She does admit to occasional marijuana use but denies cocaine use.  Other medical problems include uterine fibroids which she was told may improve with menopause.  These have been stable.  She smokes 1 PPD and states she may be interested in quitting.  Past Medical History  Diagnosis Date  . Hypertension   . Fibroid uterus     History reviewed. No pertinent past surgical history.  Social History History  Substance Use Topics  . Smoking status: Current Every Day Smoker -- 1.00 packs/day    Types: Cigarettes  . Smokeless tobacco: Not on file  . Alcohol Use: Yes     Comment: occasional    Family History Diabetes, Hypertension  Allergies  No Known Allergies   Current Facility-Administered Medications  Medication Dose Route Frequency Provider Last Rate Last Dose  . iohexol (OMNIPAQUE) 300 MG/ML solution 25 mL  25 mL Oral Once PRN Medication Radiologist, MD      . labetalol (NORMODYNE,TRANDATE) 500 mg in dextrose 5 % 125 mL (4 mg/mL) infusion  2 mg/min Intravenous Titrated Davonna Belling, MD       Current Outpatient Prescriptions  Medication Sig Dispense Refill  . acetaminophen (TYLENOL) 325 MG tablet Take 650 mg by mouth every 6 (six) hours as needed for mild pain.      ROS:   General:  No weight loss, Fever, chills  HEENT: No recent headaches, no nasal bleeding,  no visual changes, no sore throat  Neurologic: No dizziness, blackouts, seizures. No recent symptoms of stroke or mini- stroke. No recent episodes of slurred speech, or temporary blindness.  Cardiac: No recent episodes of chest pain/pressure, no shortness of breath at rest.  No shortness of breath with exertion.  Denies history of atrial fibrillation or irregular heartbeat  Vascular: No history of rest pain in feet.  No history of claudication.  No history of non-healing ulcer, No history of DVT   Pulmonary: No home oxygen, no productive cough, no hemoptysis,  No asthma or wheezing  Musculoskeletal:  [ ]  Arthritis, [ ]  Low back pain,  [ ]  Joint pain  Hematologic:No history of hypercoagulable state.  No history of easy bleeding.  No history of anemia  Gastrointestinal: No hematochezia or melena,  No gastroesophageal reflux, no trouble swallowing  Urinary: [ ]  chronic Kidney disease, [ ]  on HD - [ ]  MWF or [ ]  TTHS, [ ]  Burning with urination, [ ]  Frequent urination, [ ]  Difficulty urinating;   Skin: No rashes  Psychological: No history of anxiety,  No history of depression   Physical Examination  Filed Vitals:   11/03/14 1437 11/03/14 1445 11/03/14 1500 11/03/14 1545  BP: 214/108 220/100 190/82 191/94  Pulse: 58 56    Temp:      TempSrc:      Resp: 16 21 22  18  Height:      Weight:      SpO2: 98% 98%      Body mass index is 50 kg/(m^2).  General:  Alert and oriented, no acute distress HEENT: Normal Neck: No JVD Pulmonary: Clear to auscultation bilaterally Cardiac: Regular Rate and Rhythm Abdomen: Soft, non-tender, non-distended, mass palpable to mid upper abdomen no pulsatile firm, no scars Skin: No rash Extremity Pulses:  2+ radial, brachial, femoral, dorsalis pedis pulses bilaterally Musculoskeletal: No deformity or edema  Neurologic: Upper and lower extremity motor 5/5 and symmetric  DATA:   CBC    Component Value Date/Time   WBC 11.0* 11/03/2014 1243   RBC  4.39 11/03/2014 1243   HGB 14.6 11/03/2014 1254   HCT 43.0 11/03/2014 1254   PLT 297 11/03/2014 1243   MCV 86.8 11/03/2014 1243   MCH 29.4 11/03/2014 1243   MCHC 33.9 11/03/2014 1243   RDW 16.3* 11/03/2014 1243   LYMPHSABS 1.1 11/03/2014 1243   MONOABS 0.4 11/03/2014 1243   EOSABS 0.0 11/03/2014 1243   BASOSABS 0.0 11/03/2014 1243    CMP     Component Value Date/Time   NA 141 11/03/2014 1254   K 3.2* 11/03/2014 1254   CL 100 11/03/2014 1254   CO2 22 11/03/2014 1243   GLUCOSE 144* 11/03/2014 1254   BUN 25* 11/03/2014 1254   CREATININE 0.90 11/03/2014 1254   CALCIUM 9.3 11/03/2014 1243   PROT 7.8 11/03/2014 1243   ALBUMIN 4.2 11/03/2014 1243   AST 17 11/03/2014 1243   ALT 14 11/03/2014 1243   ALKPHOS 74 11/03/2014 1243   BILITOT 0.3 11/03/2014 1243   GFRNONAA 59* 11/03/2014 1243   GFRAA 68* 11/03/2014 1243    CT abd pelvis images reviewed, aortic dissection origin not seen due to limited chest images.  Celiac and SMA fill left and right renals fill, dissection extends into left common iliac but no flow limitation of external iliacs, common femorals are 12 mm diameter. Abdominal aorta 25 mm.  Celiac and SMA adjacent to each other and appear to come off the same flow channel difficult to tell if this is true or false lumen without chest cuts  ASSESSMENT:  Aortic dissection, acute   PLAN: 1.  Needs admit to critical care service for tight medical management of her hypertension.  2. Chest CT currently in progress to determine proximal extent of dissection.  If this involves the ascending aorta will need Cardiac Surgery consult.  Otherwise no acute indication for repair as no evidence of end organ compromise or ischemia or rupture.  Will follow as consult  Ruta Hinds, MD [facility] Office: [phone] Pager: [phone]  CT Chest images reviewed.  Dissection begins in the Ascending aorta and involves the arch.  Asc diameter 5.2 cm,  Celiac left renal come off true lumen, SMA and right renal come off the false lumen.  Pt CT findings discussed with Dr Servando Snare with CT surgery who will eval patient.  Ruta Hinds, MD [facility] Office: [phone] Pager: [phone]

## 2014-11-03 NOTE — ED Provider Notes (Signed)
CSN: [id number]     Arrival date & time 11/03/14  1234 History   First MD Initiated Contact with Patient 11/03/14 1244     Chief Complaint  Patient presents with  . Abdominal Pain     (Consider location/radiation/quality/duration/timing/severity/associated sxs/prior Treatment) Patient is a 49 y.o. female presenting with abdominal pain. The history is provided by the patient.  Abdominal Pain Associated symptoms: diarrhea, nausea and vaginal bleeding   Associated symptoms: no chills    patient presents with abdominal pain. Began severely today. States she was doing okay yesterday. She has had some nausea vomiting diarrhea. States the pain is severe. States her abdomen is getting larger or last couple years. Looks like she has a previous history of a fibroid uterus. May have had some fecal incontinence earlier today. No fevers. She is moaning in pain and is somewhat difficult historian.  Past Medical History  Diagnosis Date  . Hypertension   . Fibroid uterus    History reviewed. No pertinent past surgical history. No family history on file. History  Substance Use Topics  . Smoking status: Current Every Day Smoker -- 1.00 packs/day    Types: Cigarettes  . Smokeless tobacco: Not on file  . Alcohol Use: Yes     Comment: occasional   OB History    No data available     Review of Systems  Constitutional: Negative for chills and appetite change.  Gastrointestinal: Positive for nausea, abdominal pain and diarrhea.  Genitourinary: Positive for vaginal bleeding. Negative for flank pain.  Musculoskeletal: Positive for back pain.  Skin: Negative for wound.      Allergies  Review of patient's allergies indicates no known allergies.  Home Medications   Prior to Admission medications   Medication Sig Start Date End Date Taking? Authorizing Provider  acetaminophen (TYLENOL) 325 MG tablet Take 650 mg by mouth every 6 (six) hours as needed for mild pain.   Yes Historical Provider, MD    BP 191/86 mmHg  Pulse 57  Temp(Src) 96 F (35.6 C) (Rectal)  Resp 19  Ht 5' (1.524 m)  Wt 256 lb (116.121 kg)  BMI 50.00 kg/m2  SpO2 99%  LMP 10/29/2014 Physical Exam  Constitutional: She appears well-developed.  Patient is moaning in pain.  HENT:  Head: Normocephalic and atraumatic.  Neck: Neck supple.  Cardiovascular: Normal rate.   Pulmonary/Chest: Effort normal.  Abdominal: She exhibits mass. There is tenderness.  Diffuse tenderness with some guarding. Abdominal masses over much of the abdomen but particularly mid to lower abdomen. There is a large one on the right abdomen.  Musculoskeletal: She exhibits no edema.  Neurological: She is alert.    ED Course  Procedures (including critical care time) Labs Review Labs Reviewed  CBC WITH DIFFERENTIAL/PLATELET - Abnormal; Notable for the following:    WBC 11.0 (*)    RDW 16.3 (*)    Neutrophils Relative % 86 (*)    Neutro Abs 9.5 (*)    Lymphocytes Relative 10 (*)    All other components within normal limits  COMPREHENSIVE METABOLIC PANEL - Abnormal; Notable for the following:    Potassium 3.2 (*)    Glucose, Bld 149 (*)    GFR calc non Af Amer 59 (*)    GFR calc Af Amer 68 (*)    All other components within normal limits  I-STAT CHEM 8, ED - Abnormal; Notable for the following:    Potassium 3.2 (*)    BUN 25 (*)    Glucose,  Bld 144 (*)    All other components within normal limits  I-STAT CG4 LACTIC ACID, ED - Abnormal; Notable for the following:    Lactic Acid, Venous 2.30 (*)    All other components within normal limits  LIPASE, BLOOD  URINALYSIS, ROUTINE W REFLEX MICROSCOPIC  PROTIME-INR  APTT  I-STAT TROPOININ, ED  TYPE AND SCREEN    Imaging Review Ct Abdomen Pelvis W Contrast  11/03/2014   CLINICAL DATA:  Abdominal pain.  EXAM: CT ABDOMEN AND PELVIS WITH CONTRAST  TECHNIQUE: Multidetector CT imaging of the abdomen and pelvis was performed using the standard protocol following bolus administration of  intravenous contrast.  CONTRAST:  125mL OMNIPAQUE IOHEXOL 300 MG/ML  SOLN  COMPARISON:  None.  FINDINGS: Lower chest: The lung bases are unremarkable.  Heart size is normal.  Upper abdomen: No focal abnormality identified within the liver, spleen, pancreas, or adrenal glands. There are extrarenal pelves bilaterally. There is slight delay in enhancement of the right kidney, possibly related to delayed arterial inflow. See below.  Gastrointestinal tract: The stomach and small bowel loops are nondilated the compressed primarily within the right upper quadrant. Colonic loops are normal in appearance. The appendix is not well seen but may be obscured or absent.  Pelvis: Extending from the pelvis into the abdomen there is a large heterogeneous solid mass extending out superior portion of the uterus and measuring at least 27 x 27 x 13.4 cm. The urinary bladder is decompressed but has a thickened wall. No definite ovarian mass identified. There is no free pelvic fluid.  Retroperitoneum: There is an abdominal aortic dissection which begins at the upper most cuts of the study and extends into the left iliac artery. Dissection flap extends into the celiac axis and superior mesenteric artery. Is difficult to assess the integrity of the renal arteries given the contrast bolus which was performed for general abdominal imaging. Contrast is identified within the inferior mesenteric artery.  Abdominal wall: Unremarkable.  Osseous structures: There are moderate degenerative changes in the lower thoracic and lower lumbar spine. No suspicious lytic or blastic lesions are identified.  IMPRESSION: 1. Abdominal aortic dissection extending into the celiac axis, superior mesenteric artery, and left iliac artery. Because of the timing of the contrast bolus, is difficult to evaluate integrity of the abdominal vessels. 2. Consider CT angiography of the chest and abdomen for further characterization. 3. There is probable delayed nephrogram  right kidney, raising suspicion for vascular compromise right renal artery. 4. Large pelvic mass extending from the fundus of the uterus consistent with large uterine fibroids, measuring at least 27 cm. 5. Critical Value/emergent results were called by telephone at the time of interpretation on 11/03/2014 at 2:56 pm to Dr. Davonna Belling , who verbally acknowledged these results.   Electronically Signed   By: Nolon Nations M.D.   On: 11/03/2014 15:01   Dg Abd Acute W/chest  11/03/2014   CLINICAL DATA:  Generalized abdominal pain.  Diarrhea.  EXAM: DG ABDOMEN ACUTE W/ 1V CHEST  COMPARISON:  None.  FINDINGS: There is no evidence of dilated bowel loops or free intraperitoneal air. No radiopaque calculi identified. Lower lumbar spine degenerative changes noted.  Heart size and mediastinal contours are within normal limits. Mild tortuosity of thoracic aorta is noted. Both lungs are clear. No evidence of pneumothorax or pleural effusion.  IMPRESSION: Normal bowel gas pattern.  No active cardiopulmonary disease.   Electronically Signed   By: Earle Gell M.D.   On: 11/03/2014 16:49  EKG Interpretation   Date/Time:  Saturday November 03 2014 12:38:52 EDT Ventricular Rate:  50 PR Interval:  215 QRS Duration: 79 QT Interval:  664 QTC Calculation: 606 R Axis:   66 Text Interpretation:  Sinus rhythm Prolonged PR interval Probable left  atrial enlargement Abnrm T, consider ischemia, anterolateral lds Prolonged  QT interval Confirmed by Alvino Chapel  MD, Notnamed Scholz [phone]) on 11/03/2014  4:59:38 PM      MDM   Final diagnoses:  Pain  Aortic dissection    Patient with severe abdominal pain. Surgical abdomen on exam. CT of the abdomen and pelvis showed aortic dissection and large fibroid. Discussed with Dr. Oneida Alar from vascular surgery recommended getting a CT angiogram of the chest abdomen pelvis. This showed that it was a type A aortic dissection. Patient are to been started on labetalol and required  repeat dosings of pain medicine. Dr. Servando Snare is aware the patient and will take her to the operating room.  CRITICAL CARE Performed by: Mackie Pai Total critical care time: 45 Critical care time was exclusive of separately billable procedures and treating other patients. Critical care was necessary to treat or prevent imminent or life-threatening deterioration. Critical care was time spent personally by me on the following activities: development of treatment plan with patient and/or surrogate as well as nursing, discussions with consultants, evaluation of patient's response to treatment, examination of patient, obtaining history from patient or surrogate, ordering and performing treatments and interventions, ordering and review of laboratory studies, ordering and review of radiographic studies, pulse oximetry and re-evaluation of patient's condition.     Davonna Belling, MD 11/03/14 1700

## 2014-11-03 NOTE — Anesthesia Preprocedure Evaluation (Addendum)
Anesthesia Evaluation  Patient identified by MRN, date of birth, ID band  Reviewed: Allergy & Precautions, NPO status , Patient's Chart, lab work & pertinent test resultsPreop documentation limited or incomplete due to emergent nature of procedure.  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, to OR emergently   Neuro/Psych    GI/Hepatic   Endo/Other  Morbid obesity  Renal/GU      Musculoskeletal   Abdominal (+) + obese,   Peds  Hematology   Anesthesia Other Findings   Reproductive/Obstetrics                            Anesthesia Physical Anesthesia Plan  ASA: IV and emergent  Anesthesia Plan: General   Post-op Pain Management:    Induction:   Airway Management Planned: Oral ETT and Video Laryngoscope Planned  Additional Equipment: Arterial line, TEE, Ultrasound Guidance Line Placement and PA Cath  Intra-op Plan: Utilization Of Total Body Hypothermia per surgeon request  Post-operative Plan: Post-operative intubation/ventilation  Informed Consent: I have reviewed the patients History and Physical, chart, labs and discussed the procedure including the risks, benefits and alternatives for the proposed anesthesia with the patient or authorized representative who has indicated his/her understanding and acceptance.   History available from chart only and Only emergency history available  Plan Discussed with: CRNA, Surgeon and Anesthesiologist  Anesthesia Plan Comments:        Anesthesia Quick Evaluation

## 2014-11-03 NOTE — Brief Op Note (Addendum)
      [address]       [place], [address]             [phone]     11/03/2014  12:41 AM  PATIENT:  Ashley Guerrero  49 y.o. female  PRE-OPERATIVE DIAGNOSIS:  Acute Type I Aortic Dissection  POST-OPERATIVE DIAGNOSIS:  same PROCEDURE:  Procedure(s): Procedure(s) (LRB): REPLACEMENT ASCENDING AORTA with a 74mm hemashield graft,  resuspension of aortic valve, hypothermic circulatory arrest and cardiopulmonary bypass. (N/A)  SURGEON:  Surgeon(s) and Role:    * [name], MD - Primary  PHYSICIAN ASSISTANT: Erin Barrett PA-C  ANESTHESIA:   general  EBL:  Total I/O In: 8127 [I.V.:2000; Blood:1920] Out: 3300 [Urine:1500; Blood:1800]  BLOOD ADMINISTERED:2U PRBC,  CELLSAVER, 2 FFP and 1 PLTS  DRAINS: Mediastinal Chest Drains   LOCAL MEDICATIONS USED:  NONE  SPECIMEN:  No Specimen  DISPOSITION OF SPECIMEN:  N/A  COUNTS:  YES  TOURNIQUET:  * No tourniquets in log *  DICTATION: .Dragon Dictation  PLAN OF CARE: Admit to inpatient   PATIENT DISPOSITION:  ICU - intubated and hemodynamically stable.   Delay start of Pharmacological VTE agent (>24hrs) due to surgical blood loss or risk of bleeding: yes

## 2014-11-03 NOTE — ED Notes (Signed)
Dr. Alvino Chapel was notified of the patients 2.30 I-Stat CG4 Lactic Acid.

## 2014-11-03 NOTE — ED Notes (Signed)
Patient transported to CT 

## 2014-11-03 NOTE — ED Notes (Signed)
Per EMS pt is from home c/o 10/10 abdominal pain after urinating with nausea and two episodes of vomiting. Pt defecated on self without knowing. Pain is generalized in abdomen, and intermittent. Patient last PO intake was pizza last night.   4mg  of IV zofran for nausea. EKG- Sinus brady. No pulsating masses, distal pulses 2+, generalized tenderness.

## 2014-11-03 NOTE — ED Notes (Signed)
Pt sent to CT prior to returning to room.

## 2014-11-04 ENCOUNTER — Inpatient Hospital Stay (HOSPITAL_COMMUNITY): Payer: Medicaid - Out of State

## 2014-11-04 DIAGNOSIS — R1900 Intra-abdominal and pelvic swelling, mass and lump, unspecified site: Secondary | ICD-10-CM | POA: Diagnosis present

## 2014-11-04 DIAGNOSIS — Z95828 Presence of other vascular implants and grafts: Secondary | ICD-10-CM | POA: Diagnosis not present

## 2014-11-04 DIAGNOSIS — I10 Essential (primary) hypertension: Secondary | ICD-10-CM | POA: Diagnosis present

## 2014-11-04 DIAGNOSIS — I7101 Dissection of thoracic aorta: Secondary | ICD-10-CM | POA: Diagnosis present

## 2014-11-04 DIAGNOSIS — F17210 Nicotine dependence, cigarettes, uncomplicated: Secondary | ICD-10-CM | POA: Diagnosis present

## 2014-11-04 DIAGNOSIS — D696 Thrombocytopenia, unspecified: Secondary | ICD-10-CM | POA: Diagnosis present

## 2014-11-04 DIAGNOSIS — D259 Leiomyoma of uterus, unspecified: Secondary | ICD-10-CM | POA: Diagnosis present

## 2014-11-04 DIAGNOSIS — Z9114 Patient's other noncompliance with medication regimen: Secondary | ICD-10-CM | POA: Diagnosis present

## 2014-11-04 DIAGNOSIS — D62 Acute posthemorrhagic anemia: Secondary | ICD-10-CM | POA: Diagnosis not present

## 2014-11-04 DIAGNOSIS — Z8249 Family history of ischemic heart disease and other diseases of the circulatory system: Secondary | ICD-10-CM | POA: Diagnosis not present

## 2014-11-04 DIAGNOSIS — Z6841 Body Mass Index (BMI) 40.0 and over, adult: Secondary | ICD-10-CM | POA: Diagnosis not present

## 2014-11-04 DIAGNOSIS — K5900 Constipation, unspecified: Secondary | ICD-10-CM | POA: Diagnosis not present

## 2014-11-04 DIAGNOSIS — R109 Unspecified abdominal pain: Secondary | ICD-10-CM | POA: Diagnosis not present

## 2014-11-04 DIAGNOSIS — E119 Type 2 diabetes mellitus without complications: Secondary | ICD-10-CM | POA: Diagnosis present

## 2014-11-04 DIAGNOSIS — I71019 Dissection of thoracic aorta, unspecified: Secondary | ICD-10-CM | POA: Diagnosis present

## 2014-11-04 DIAGNOSIS — E8770 Fluid overload, unspecified: Secondary | ICD-10-CM | POA: Diagnosis not present

## 2014-11-04 DIAGNOSIS — J9811 Atelectasis: Secondary | ICD-10-CM | POA: Diagnosis not present

## 2014-11-04 LAB — CBC
HCT: 30.7 % — ABNORMAL LOW (ref 36.0–46.0)
HEMATOCRIT: 28.8 % — AB (ref 36.0–46.0)
HEMATOCRIT: 29.3 % — AB (ref 36.0–46.0)
HEMOGLOBIN: 10.3 g/dL — AB (ref 12.0–15.0)
Hemoglobin: 9.9 g/dL — ABNORMAL LOW (ref 12.0–15.0)
Hemoglobin: 10.2 g/dL — ABNORMAL LOW (ref 12.0–15.0)
MCH: 29.6 pg (ref 26.0–34.0)
MCH: 29.7 pg (ref 26.0–34.0)
MCH: 28.9 pg (ref 26.0–34.0)
MCHC: 34.4 g/dL (ref 30.0–36.0)
MCHC: 34.8 g/dL (ref 30.0–36.0)
MCHC: 33.6 g/dL (ref 30.0–36.0)
MCV: 86 fL (ref 78.0–100.0)
MCV: 85.4 fL (ref 78.0–100.0)
MCV: 86.2 fL (ref 78.0–100.0)
Platelets: 168 10*3/uL (ref 150–400)
Platelets: 179 10*3/uL (ref 150–400)
Platelets: 157 10*3/uL (ref 150–400)
RBC: 3.35 MIL/uL — AB (ref 3.87–5.11)
RBC: 3.43 MIL/uL — ABNORMAL LOW (ref 3.87–5.11)
RBC: 3.56 MIL/uL — ABNORMAL LOW (ref 3.87–5.11)
RDW: 15.7 % — ABNORMAL HIGH (ref 11.5–15.5)
RDW: 15.9 % — ABNORMAL HIGH (ref 11.5–15.5)
RDW: 16.4 % — AB (ref 11.5–15.5)
WBC: 11.6 10*3/uL — AB (ref 4.0–10.5)
WBC: 8.8 10*3/uL (ref 4.0–10.5)
WBC: 11.9 10*3/uL — ABNORMAL HIGH (ref 4.0–10.5)

## 2014-11-04 LAB — PREPARE CRYOPRECIPITATE: UNIT DIVISION: 0

## 2014-11-04 LAB — POCT I-STAT, CHEM 8
BUN: 14 mg/dL (ref 6–20)
BUN: 16 mg/dL (ref 6–20)
BUN: 16 mg/dL (ref 6–20)
BUN: 16 mg/dL (ref 6–20)
BUN: 17 mg/dL (ref 6–20)
BUN: 17 mg/dL (ref 6–20)
BUN: 18 mg/dL (ref 6–20)
Calcium, Ion: 0.87 mmol/L — ABNORMAL LOW (ref 1.12–1.23)
Calcium, Ion: 0.89 mmol/L — ABNORMAL LOW (ref 1.12–1.23)
Calcium, Ion: 0.93 mmol/L — ABNORMAL LOW (ref 1.12–1.23)
Calcium, Ion: 1 mmol/L — ABNORMAL LOW (ref 1.12–1.23)
Calcium, Ion: 1.14 mmol/L (ref 1.12–1.23)
Calcium, Ion: 1.16 mmol/L (ref 1.12–1.23)
Calcium, Ion: 1.21 mmol/L (ref 1.12–1.23)
Chloride: 101 mmol/L (ref 101–111)
Chloride: 101 mmol/L (ref 101–111)
Chloride: 102 mmol/L (ref 101–111)
Chloride: 104 mmol/L (ref 101–111)
Chloride: 98 mmol/L (ref 101–111)
Chloride: 99 mmol/L (ref 101–111)
Chloride: 99 mmol/L (ref 101–111)
Creatinine, Ser: 0.9 mg/dL (ref 0.44–1.00)
Creatinine, Ser: 0.9 mg/dL (ref 0.44–1.00)
Creatinine, Ser: 0.9 mg/dL (ref 0.44–1.00)
Creatinine, Ser: 1 mg/dL (ref 0.44–1.00)
Creatinine, Ser: 1 mg/dL (ref 0.44–1.00)
Creatinine, Ser: 1 mg/dL (ref 0.44–1.00)
Creatinine, Ser: 1.1 mg/dL (ref 0.44–1.00)
Glucose, Bld: 119 mg/dL — ABNORMAL HIGH (ref 70–99)
Glucose, Bld: 124 mg/dL — ABNORMAL HIGH (ref 70–99)
Glucose, Bld: 127 mg/dL — ABNORMAL HIGH (ref 70–99)
Glucose, Bld: 128 mg/dL — ABNORMAL HIGH (ref 70–99)
Glucose, Bld: 143 mg/dL — ABNORMAL HIGH (ref 70–99)
Glucose, Bld: 144 mg/dL — ABNORMAL HIGH (ref 70–99)
Glucose, Bld: 145 mg/dL — ABNORMAL HIGH (ref 70–99)
HCT: 25 % — ABNORMAL LOW (ref 36.0–46.0)
HCT: 26 % — ABNORMAL LOW (ref 36.0–46.0)
HCT: 27 % — ABNORMAL LOW (ref 36.0–46.0)
HCT: 27 % — ABNORMAL LOW (ref 36.0–46.0)
HCT: 34 % — ABNORMAL LOW (ref 36.0–46.0)
HCT: 35 % — ABNORMAL LOW (ref 36.0–46.0)
HCT: 40 % (ref 36.0–46.0)
Hemoglobin: 11.6 g/dL — ABNORMAL LOW (ref 12.0–15.0)
Hemoglobin: 11.9 g/dL — ABNORMAL LOW (ref 12.0–15.0)
Hemoglobin: 13.6 g/dL (ref 12.0–15.0)
Hemoglobin: 8.5 g/dL — ABNORMAL LOW (ref 12.0–15.0)
Hemoglobin: 8.8 g/dL — ABNORMAL LOW (ref 12.0–15.0)
Hemoglobin: 9.2 g/dL — ABNORMAL LOW (ref 12.0–15.0)
Hemoglobin: 9.2 g/dL — ABNORMAL LOW (ref 12.0–15.0)
Potassium: 3.4 mmol/L — ABNORMAL LOW (ref 3.5–5.1)
Potassium: 3.5 mmol/L (ref 3.5–5.1)
Potassium: 3.6 mmol/L (ref 3.5–5.1)
Potassium: 3.7 mmol/L (ref 3.5–5.1)
Potassium: 3.9 mmol/L (ref 3.5–5.1)
Potassium: 4.4 mmol/L (ref 3.5–5.1)
Potassium: 4.9 mmol/L (ref 3.5–5.1)
Sodium: 132 mmol/L — ABNORMAL LOW (ref 135–145)
Sodium: 134 mmol/L — ABNORMAL LOW (ref 135–145)
Sodium: 135 mmol/L (ref 135–145)
Sodium: 136 mmol/L (ref 135–145)
Sodium: 137 mmol/L (ref 135–145)
Sodium: 139 mmol/L (ref 135–145)
Sodium: 140 mmol/L (ref 135–145)
TCO2: 17 mmol/L (ref 0–100)
TCO2: 18 mmol/L (ref 0–100)
TCO2: 19 mmol/L (ref 0–100)
TCO2: 19 mmol/L (ref 0–100)
TCO2: 20 mmol/L (ref 0–100)
TCO2: 21 mmol/L (ref 0–100)
TCO2: 21 mmol/L (ref 0–100)

## 2014-11-04 LAB — GLUCOSE, CAPILLARY
Glucose-Capillary: 131 mg/dL — ABNORMAL HIGH (ref 70–99)
Glucose-Capillary: 138 mg/dL — ABNORMAL HIGH (ref 70–99)
Glucose-Capillary: 148 mg/dL — ABNORMAL HIGH (ref 70–99)
Glucose-Capillary: 127 mg/dL — ABNORMAL HIGH (ref 70–99)
Glucose-Capillary: 119 mg/dL — ABNORMAL HIGH (ref 70–99)
Glucose-Capillary: 132 mg/dL — ABNORMAL HIGH (ref 70–99)
Glucose-Capillary: 136 mg/dL — ABNORMAL HIGH (ref 70–99)
Glucose-Capillary: 119 mg/dL — ABNORMAL HIGH (ref 70–99)
Glucose-Capillary: 120 mg/dL — ABNORMAL HIGH (ref 70–99)
Glucose-Capillary: 118 mg/dL — ABNORMAL HIGH (ref 70–99)
Glucose-Capillary: 118 mg/dL — ABNORMAL HIGH (ref 70–99)
Glucose-Capillary: 119 mg/dL — ABNORMAL HIGH (ref 70–99)
Glucose-Capillary: 124 mg/dL — ABNORMAL HIGH (ref 70–99)
Glucose-Capillary: 121 mg/dL — ABNORMAL HIGH (ref 70–99)
Glucose-Capillary: 104 mg/dL — ABNORMAL HIGH (ref 70–99)

## 2014-11-04 LAB — BASIC METABOLIC PANEL
Anion gap: 10 (ref 5–15)
BUN: 16 mg/dL (ref 6–20)
CALCIUM: 7.6 mg/dL — AB (ref 8.9–10.3)
CO2: 24 mmol/L (ref 22–32)
Chloride: 105 mmol/L (ref 101–111)
Creatinine, Ser: 1.26 mg/dL — ABNORMAL HIGH (ref 0.44–1.00)
GFR, EST AFRICAN AMERICAN: 57 mL/min — AB (ref >60)
GFR, EST NON AFRICAN AMERICAN: 50 mL/min — AB (ref >60)
Glucose, Bld: 126 mg/dL — ABNORMAL HIGH (ref 70–99)
POTASSIUM: 3.7 mmol/L (ref 3.5–5.1)
SODIUM: 139 mmol/L (ref 135–145)

## 2014-11-04 LAB — POCT I-STAT 4, (NA,K, GLUC, HGB,HCT)
Glucose, Bld: 139 mg/dL — ABNORMAL HIGH (ref 70–99)
HCT: 30 % — ABNORMAL LOW (ref 36.0–46.0)
Hemoglobin: 10.2 g/dL — ABNORMAL LOW (ref 12.0–15.0)
Potassium: 3.2 mmol/L — ABNORMAL LOW (ref 3.5–5.1)
Sodium: 139 mmol/L (ref 135–145)

## 2014-11-04 LAB — POCT I-STAT 3, ART BLOOD GAS (G3+)
Acid-Base Excess: 4 mmol/L — ABNORMAL HIGH (ref 0.0–2.0)
Acid-base deficit: 1 mmol/L (ref 0.0–2.0)
Acid-base deficit: 1 mmol/L (ref 0.0–2.0)
Acid-base deficit: 2 mmol/L (ref 0.0–2.0)
Acid-base deficit: 4 mmol/L — ABNORMAL HIGH (ref 0.0–2.0)
Acid-base deficit: 6 mmol/L — ABNORMAL HIGH (ref 0.0–2.0)
Bicarbonate: 18.8 mEq/L — ABNORMAL LOW (ref 20.0–24.0)
Bicarbonate: 23.1 mEq/L (ref 20.0–24.0)
Bicarbonate: 23.2 mEq/L (ref 20.0–24.0)
Bicarbonate: 23.2 mEq/L (ref 20.0–24.0)
Bicarbonate: 23.6 mEq/L (ref 20.0–24.0)
Bicarbonate: 23.9 mEq/L (ref 20.0–24.0)
Bicarbonate: 24.3 mEq/L — ABNORMAL HIGH (ref 20.0–24.0)
Bicarbonate: 24.3 mEq/L — ABNORMAL HIGH (ref 20.0–24.0)
Bicarbonate: 27.3 mEq/L — ABNORMAL HIGH (ref 20.0–24.0)
O2 Saturation: 100 %
O2 Saturation: 100 %
O2 Saturation: 91 %
O2 Saturation: 91 %
O2 Saturation: 92 %
O2 Saturation: 93 %
O2 Saturation: 96 %
O2 Saturation: 99 %
O2 Saturation: 99 %
Patient temperature: 35.8
Patient temperature: 37
Patient temperature: 37
Patient temperature: 37
Patient temperature: 37
Patient temperature: 37.1
Patient temperature: 37
TCO2: 20 mmol/L (ref 0–100)
TCO2: 24 mmol/L (ref 0–100)
TCO2: 24 mmol/L (ref 0–100)
TCO2: 25 mmol/L (ref 0–100)
TCO2: 25 mmol/L (ref 0–100)
TCO2: 25 mmol/L (ref 0–100)
TCO2: 25 mmol/L (ref 0–100)
TCO2: 25 mmol/L (ref 0–100)
TCO2: 28 mmol/L (ref 0–100)
pCO2 arterial: 33.7 mmHg — ABNORMAL LOW (ref 35.0–45.0)
pCO2 arterial: 35.2 mmHg (ref 35.0–45.0)
pCO2 arterial: 35.2 mmHg (ref 35.0–45.0)
pCO2 arterial: 36.1 mmHg (ref 35.0–45.0)
pCO2 arterial: 36.3 mmHg (ref 35.0–45.0)
pCO2 arterial: 37.2 mmHg (ref 35.0–45.0)
pCO2 arterial: 37.4 mmHg (ref 35.0–45.0)
pCO2 arterial: 45 mmHg (ref 35.0–45.0)
pCO2 arterial: 48.9 mmHg — ABNORMAL HIGH (ref 35.0–45.0)
pH, Arterial: 7.282 — ABNORMAL LOW (ref 7.350–7.450)
pH, Arterial: 7.328 — ABNORMAL LOW (ref 7.350–7.450)
pH, Arterial: 7.355 (ref 7.350–7.450)
pH, Arterial: 7.401 (ref 7.350–7.450)
pH, Arterial: 7.425 (ref 7.350–7.450)
pH, Arterial: 7.427 (ref 7.350–7.450)
pH, Arterial: 7.433 (ref 7.350–7.450)
pH, Arterial: 7.435 (ref 7.350–7.450)
pH, Arterial: 7.484 — ABNORMAL HIGH (ref 7.350–7.450)
pO2, Arterial: 119 mmHg — ABNORMAL HIGH (ref 80.0–100.0)
pO2, Arterial: 159 mmHg — ABNORMAL HIGH (ref 80.0–100.0)
pO2, Arterial: 180 mmHg — ABNORMAL HIGH (ref 80.0–100.0)
pO2, Arterial: 226 mmHg — ABNORMAL HIGH (ref 80.0–100.0)
pO2, Arterial: 58 mmHg — ABNORMAL LOW (ref 80.0–100.0)
pO2, Arterial: 61 mmHg — ABNORMAL LOW (ref 80.0–100.0)
pO2, Arterial: 62 mmHg — ABNORMAL LOW (ref 80.0–100.0)
pO2, Arterial: 66 mmHg — ABNORMAL LOW (ref 80.0–100.0)
pO2, Arterial: 75 mmHg — ABNORMAL LOW (ref 80.0–100.0)

## 2014-11-04 LAB — MAGNESIUM
MAGNESIUM: 2.7 mg/dL — AB (ref 1.7–2.4)
Magnesium: 2.9 mg/dL — ABNORMAL HIGH (ref 1.7–2.4)

## 2014-11-04 LAB — PROTIME-INR
INR: 1.36 (ref 0.00–1.49)
PROTHROMBIN TIME: 16.9 s — AB (ref 11.6–15.2)

## 2014-11-04 LAB — CREATININE, SERUM
Creatinine, Ser: 1.23 mg/dL — ABNORMAL HIGH (ref 0.44–1.00)
GFR, EST AFRICAN AMERICAN: 59 mL/min — AB (ref >60)
GFR, EST NON AFRICAN AMERICAN: 51 mL/min — AB (ref >60)

## 2014-11-04 LAB — APTT: APTT: 32 s (ref 24–37)

## 2014-11-04 LAB — MRSA PCR SCREENING: MRSA by PCR: NEGATIVE

## 2014-11-04 LAB — PREPARE PLATELET PHERESIS: Unit division: 0

## 2014-11-04 MED ORDER — SODIUM CHLORIDE 0.45 % IV SOLN: INTRAVENOUS | Status: DC | PRN

## 2014-11-04 MED ORDER — ACETAMINOPHEN 160 MG/5ML PO SOLN: 650.0000 mg | Freq: Once | ORAL | Status: DC

## 2014-11-04 MED ORDER — CHLORHEXIDINE GLUCONATE 0.12 % MT SOLN
15.0000 mL | Freq: Two times a day (BID) | OROMUCOSAL | Status: DC
  Administered 2014-11-04: 15 mL via OROMUCOSAL
  Filled 2014-11-04: qty 15

## 2014-11-04 MED ORDER — INSULIN REGULAR BOLUS VIA INFUSION
0.0000 [IU] | Freq: Three times a day (TID) | INTRAVENOUS | Status: DC
Start: 2014-11-04 — End: 2014-11-04
  Filled 2014-11-04: qty 10

## 2014-11-04 MED ORDER — DOPAMINE-DEXTROSE 3.2-5 MG/ML-% IV SOLN: 3.0000 ug/kg/min | INTRAVENOUS | Status: DC

## 2014-11-04 MED ORDER — METOPROLOL TARTRATE 12.5 MG HALF TABLET
12.5000 mg | ORAL_TABLET | Freq: Two times a day (BID) | ORAL | Status: DC
  Administered 2014-11-04: 12.5 mg via ORAL
  Filled 2014-11-04 (×4): qty 1

## 2014-11-04 MED ORDER — INSULIN ASPART 100 UNIT/ML ~~LOC~~ SOLN
0.0000 [IU] | SUBCUTANEOUS | Status: DC
  Administered 2014-11-04 – 2014-11-05 (×2): 2 [IU] via SUBCUTANEOUS

## 2014-11-04 MED ORDER — ACETAMINOPHEN 500 MG PO TABS
1000.0000 mg | ORAL_TABLET | Freq: Four times a day (QID) | ORAL | Status: DC
  Administered 2014-11-04 – 2014-11-06 (×7): 1000 mg via ORAL
  Filled 2014-11-04 (×13): qty 2

## 2014-11-04 MED ORDER — CETYLPYRIDINIUM CHLORIDE 0.05 % MT LIQD
7.0000 mL | Freq: Two times a day (BID) | OROMUCOSAL | Status: DC
  Administered 2014-11-04 – 2014-11-05 (×3): 7 mL via OROMUCOSAL

## 2014-11-04 MED ORDER — ALBUMIN HUMAN 5 % IV SOLN: 250.0000 mL | INTRAVENOUS | Status: AC | PRN

## 2014-11-04 MED ORDER — ACETAMINOPHEN 160 MG/5ML PO SOLN: 1000.0000 mg | Freq: Four times a day (QID) | ORAL | Status: DC

## 2014-11-04 MED ORDER — SODIUM CHLORIDE 0.9 % IJ SOLN
3.0000 mL | Freq: Two times a day (BID) | INTRAMUSCULAR | Status: DC
  Administered 2014-11-04 – 2014-11-05 (×4): 3 mL via INTRAVENOUS

## 2014-11-04 MED ORDER — PANTOPRAZOLE SODIUM 40 MG PO TBEC
40.0000 mg | DELAYED_RELEASE_TABLET | Freq: Every day | ORAL | Status: DC
  Administered 2014-11-05: 40 mg via ORAL
  Filled 2014-11-04: qty 1

## 2014-11-04 MED ORDER — BISACODYL 5 MG PO TBEC
10.0000 mg | DELAYED_RELEASE_TABLET | Freq: Every day | ORAL | Status: DC
  Administered 2014-11-05: 10 mg via ORAL
  Filled 2014-11-04: qty 2

## 2014-11-04 MED ORDER — FAMOTIDINE IN NACL 20-0.9 MG/50ML-% IV SOLN
20.0000 mg | Freq: Two times a day (BID) | INTRAVENOUS | Status: AC
  Administered 2014-11-04: 20 mg via INTRAVENOUS
  Filled 2014-11-04: qty 50

## 2014-11-04 MED ORDER — ASPIRIN 81 MG PO CHEW
324.0000 mg | CHEWABLE_TABLET | Freq: Every day | ORAL | Status: DC
  Administered 2014-11-04: 324 mg
  Filled 2014-11-04: qty 4

## 2014-11-04 MED ORDER — ASPIRIN EC 325 MG PO TBEC
325.0000 mg | DELAYED_RELEASE_TABLET | Freq: Every day | ORAL | Status: DC
  Administered 2014-11-05: 325 mg via ORAL
  Filled 2014-11-04 (×3): qty 1

## 2014-11-04 MED ORDER — SODIUM CHLORIDE 0.9 % IV SOLN: INTRAVENOUS | Status: DC

## 2014-11-04 MED ORDER — DEXTROSE 5 % IV SOLN
1.5000 g | Freq: Two times a day (BID) | INTRAVENOUS | Status: AC
  Administered 2014-11-04 – 2014-11-05 (×4): 1.5 g via INTRAVENOUS
  Filled 2014-11-04 (×4): qty 1.5

## 2014-11-04 MED ORDER — POTASSIUM CHLORIDE 10 MEQ/50ML IV SOLN
10.0000 meq | INTRAVENOUS | Status: AC
  Administered 2014-11-04 (×3): 10 meq via INTRAVENOUS

## 2014-11-04 MED ORDER — VANCOMYCIN HCL IN DEXTROSE 1-5 GM/200ML-% IV SOLN
1000.0000 mg | Freq: Once | INTRAVENOUS | Status: AC
Start: 2014-11-04 — End: 2014-11-04
  Administered 2014-11-04: 1000 mg via INTRAVENOUS
  Filled 2014-11-04: qty 200

## 2014-11-04 MED ORDER — SODIUM CHLORIDE 0.9 % IV SOLN
INTRAVENOUS | Status: DC
  Filled 2014-11-04: qty 2.5

## 2014-11-04 MED ORDER — POTASSIUM CHLORIDE 10 MEQ/50ML IV SOLN
INTRAVENOUS | Status: AC
  Filled 2014-11-04: qty 50

## 2014-11-04 MED ORDER — POTASSIUM CHLORIDE 10 MEQ/50ML IV SOLN
10.0000 meq | INTRAVENOUS | Status: AC
  Administered 2014-11-04 (×3): 10 meq via INTRAVENOUS
  Filled 2014-11-04 (×2): qty 50

## 2014-11-04 MED ORDER — TRAMADOL HCL 50 MG PO TABS
50.0000 mg | ORAL_TABLET | ORAL | Status: DC | PRN
  Administered 2014-11-05 (×2): 100 mg via ORAL
  Filled 2014-11-04 (×2): qty 2

## 2014-11-04 MED ORDER — MORPHINE SULFATE 2 MG/ML IJ SOLN
1.0000 mg | INTRAMUSCULAR | Status: AC | PRN
  Administered 2014-11-04: 4 mg via INTRAVENOUS

## 2014-11-04 MED ORDER — MORPHINE SULFATE 2 MG/ML IJ SOLN
2.0000 mg | INTRAMUSCULAR | Status: DC | PRN
  Administered 2014-11-04: 4 mg via INTRAVENOUS
  Administered 2014-11-04 – 2014-11-05 (×4): 2 mg via INTRAVENOUS
  Administered 2014-11-05: 4 mg via INTRAVENOUS
  Filled 2014-11-04: qty 2
  Filled 2014-11-04: qty 1
  Filled 2014-11-04 (×3): qty 2
  Filled 2014-11-04: qty 1
  Filled 2014-11-04: qty 2

## 2014-11-04 MED ORDER — METOCLOPRAMIDE HCL 5 MG/ML IJ SOLN
10.0000 mg | Freq: Four times a day (QID) | INTRAMUSCULAR | Status: AC
  Administered 2014-11-04 (×3): 10 mg via INTRAVENOUS
  Filled 2014-11-04 (×4): qty 2

## 2014-11-04 MED ORDER — MIDAZOLAM HCL 2 MG/2ML IJ SOLN
2.0000 mg | INTRAMUSCULAR | Status: DC | PRN
  Administered 2014-11-04: 2 mg via INTRAVENOUS
  Filled 2014-11-04: qty 2

## 2014-11-04 MED ORDER — OXYCODONE HCL 5 MG PO TABS
5.0000 mg | ORAL_TABLET | ORAL | Status: DC | PRN
  Administered 2014-11-04: 10 mg via ORAL
  Administered 2014-11-04: 5 mg via ORAL
  Administered 2014-11-05 (×5): 10 mg via ORAL
  Administered 2014-11-05: 5 mg via ORAL
  Administered 2014-11-06 (×2): 10 mg via ORAL
  Filled 2014-11-04 (×3): qty 2
  Filled 2014-11-04: qty 1
  Filled 2014-11-04: qty 2
  Filled 2014-11-04: qty 1
  Filled 2014-11-04 (×5): qty 2

## 2014-11-04 MED ORDER — DEXMEDETOMIDINE HCL IN NACL 200 MCG/50ML IV SOLN
0.0000 ug/kg/h | INTRAVENOUS | Status: DC
  Administered 2014-11-04 (×3): 0.7 ug/kg/h via INTRAVENOUS
  Filled 2014-11-04 (×4): qty 50

## 2014-11-04 MED ORDER — BISACODYL 10 MG RE SUPP: 10.0000 mg | Freq: Every day | RECTAL | Status: DC

## 2014-11-04 MED ORDER — PHENYLEPHRINE HCL 10 MG/ML IJ SOLN
0.0000 ug/min | INTRAMUSCULAR | Status: DC
  Filled 2014-11-04: qty 2

## 2014-11-04 MED ORDER — METOPROLOL TARTRATE 1 MG/ML IV SOLN
2.5000 mg | INTRAVENOUS | Status: DC | PRN
  Administered 2014-11-05 (×2): 5 mg via INTRAVENOUS
  Filled 2014-11-04 (×2): qty 5

## 2014-11-04 MED ORDER — LACTATED RINGERS IV SOLN: INTRAVENOUS | Status: DC

## 2014-11-04 MED ORDER — DOCUSATE SODIUM 100 MG PO CAPS
200.0000 mg | ORAL_CAPSULE | Freq: Every day | ORAL | Status: DC
  Administered 2014-11-05: 200 mg via ORAL
  Filled 2014-11-04: qty 2

## 2014-11-04 MED ORDER — SODIUM BICARBONATE 4.2 % IV SOLN
INTRAVENOUS | Status: DC | PRN
  Administered 2014-11-03: 50 meq via INTRAVENOUS

## 2014-11-04 MED ORDER — FENTANYL CITRATE (PF) 250 MCG/5ML IJ SOLN
INTRAMUSCULAR | Status: AC
  Filled 2014-11-04: qty 5

## 2014-11-04 MED ORDER — INSULIN DETEMIR 100 UNIT/ML ~~LOC~~ SOLN
10.0000 [IU] | Freq: Every day | SUBCUTANEOUS | Status: DC
  Administered 2014-11-04 – 2014-11-05 (×2): 10 [IU] via SUBCUTANEOUS
  Filled 2014-11-04 (×3): qty 0.1

## 2014-11-04 MED ORDER — SODIUM CHLORIDE 0.9 % IJ SOLN
3.0000 mL | INTRAMUSCULAR | Status: DC | PRN
  Administered 2014-11-04 (×2): 3 mL via INTRAVENOUS
  Filled 2014-11-04 (×2): qty 3

## 2014-11-04 MED ORDER — SODIUM CHLORIDE 0.9 % IV SOLN: 250.0000 mL | INTRAVENOUS | Status: DC

## 2014-11-04 MED ORDER — LACTATED RINGERS IV SOLN: 500.0000 mL | Freq: Once | INTRAVENOUS | Status: AC | PRN

## 2014-11-04 MED ORDER — ACETAMINOPHEN 650 MG RE SUPP: 650.0000 mg | Freq: Once | RECTAL | Status: DC

## 2014-11-04 MED ORDER — ONDANSETRON HCL 4 MG/2ML IJ SOLN
4.0000 mg | Freq: Four times a day (QID) | INTRAMUSCULAR | Status: DC | PRN
  Administered 2014-11-05: 4 mg via INTRAVENOUS
  Filled 2014-11-04: qty 2

## 2014-11-04 MED ORDER — METOPROLOL TARTRATE 25 MG/10 ML ORAL SUSPENSION
12.5000 mg | Freq: Two times a day (BID) | ORAL | Status: DC
Start: 2014-11-04 — End: 2014-11-05
  Filled 2014-11-04 (×4): qty 5

## 2014-11-04 MED ORDER — CETYLPYRIDINIUM CHLORIDE 0.05 % MT LIQD
7.0000 mL | Freq: Four times a day (QID) | OROMUCOSAL | Status: DC
  Administered 2014-11-04 (×3): 7 mL via OROMUCOSAL

## 2014-11-04 MED ORDER — MAGNESIUM SULFATE 4 GM/100ML IV SOLN
4.0000 g | Freq: Once | INTRAVENOUS | Status: AC
  Administered 2014-11-04: 4 g via INTRAVENOUS
  Filled 2014-11-04: qty 100

## 2014-11-04 MED ORDER — NITROGLYCERIN IN D5W 200-5 MCG/ML-% IV SOLN: 0.0000 ug/min | INTRAVENOUS | Status: DC

## 2014-11-04 NOTE — Transfer of Care (Signed)
Immediate Anesthesia Transfer of Care Note  Patient: [employer]  Procedure(s) Performed: Procedure(s): REPLACEMENT ASCENDING AORTA with a 33mm hemashield graft,  resuspension of aortic valve, hypothermic circulatory arrest and cardiopulmonary bypass. (N/A)  Patient Location: SICU  Anesthesia Type:General  Level of Consciousness: Patient remains intubated per anesthesia plan  Airway & Oxygen Therapy: Patient remains intubated per anesthesia plan and Patient placed on Ventilator (see vital sign flow sheet for setting)  Post-op Assessment: Report given to RN and Post -op Vital signs reviewed and stable  Post vital signs: Reviewed and stable  Last Vitals:  Filed Vitals:   11/03/14 1700  BP: 169/94  Pulse: 66  Temp:   Resp: 19    Complications: No apparent anesthesia complications

## 2014-11-04 NOTE — Anesthesia Postprocedure Evaluation (Signed)
  Anesthesia Post-op Note  Patient: Insurance underwriter) Performed: Procedure(s): REPLACEMENT ASCENDING AORTA with a 63mm hemashield graft,  resuspension of aortic valve, hypothermic circulatory arrest and cardiopulmonary bypass. (N/A)  Patient Location: PACU and SICU  Anesthesia Type:General  Level of Consciousness: sedated and Patient remains intubated per anesthesia plan  Airway and Oxygen Therapy: Patient remains intubated per anesthesia plan and Patient placed on Ventilator (see vital sign flow sheet for setting)  Post-op Pain: none  Post-op Assessment: Post-op Vital signs reviewed and Patient's Cardiovascular Status Stable  Post-op Vital Signs: stable  Last Vitals:  Filed Vitals:   11/04/14 0445  BP: 90/56  Pulse: 80  Temp: 37.1 C  Resp: 9    Complications: No apparent anesthesia complications

## 2014-11-04 NOTE — OR Nursing (Signed)
Called SICU when pacing wires were going in to inform them of our progress.  Made the 45 minute call at [place].

## 2014-11-04 NOTE — Procedures (Signed)
Extubation Procedure Note  Patient Details:   Name: Ashley Guerrero DOB: Nov 08, 1965 MRN: [id number]   Airway Documentation:     Evaluation  O2 sats: stable throughout Complications: No apparent complications Patient did tolerate procedure well. Bilateral Breath Sounds: Clear, Diminished Suctioning: Oral, Airway Yes   Pt tolerated SICU rapid wean, positive for cuff leak, extubated to 6L [place]. No dyspnea or stridor noted after extubation. RT will continue to monitor.   Mariam Dollar 11/04/2014, 1:44 PM

## 2014-11-04 NOTE — Progress Notes (Signed)
Patient ID: Ashley Guerrero, female   DOB: [birth year], 49 y.o.   MRN: [id number] T[facility] DAILY ICU PROGRESS NOTE                   [address]            [place] [address]          [phone]   1 Day Post-Op Procedure(s) (LRB): REPLACEMENT ASCENDING AORTA with a 18mm hemashield graft,  resuspension of aortic valve, hypothermic circulatory arrest and cardiopulmonary bypass. (N/A)  Total Length of Stay:  LOS: 0 days   Subjective: Remains on ventilator, follows commands and moves all extremities  Objective: Vital signs in last 24 hours: Temp:  [96 F (35.6 C)-98.8 F (37.1 C)] 98.6 F (37 C) (05/01 0900) Pulse Rate:  [48-80] 73 (05/01 0900) Cardiac Rhythm:  [-] Normal sinus rhythm (05/01 0800) Resp:  [0-31] 18 (05/01 0900) BP: (90-220)/(55-129) 106/60 mmHg (05/01 0900) SpO2:  [91 %-100 %] 96 % (05/01 0900) Arterial Line BP: (97-148)/(60-79) 133/60 mmHg (05/01 0900) FiO2 (%):  [50 %-80 %] 70 % (05/01 0900) Weight:  [230 lb 9.6 oz (104.6 kg)-256 lb (116.121 kg)] 230 lb 9.6 oz (104.6 kg) (05/01 0500)  Filed Weights   11/03/14 1239 11/04/14 0500  Weight: 256 lb (116.121 kg) 230 lb 9.6 oz (104.6 kg)    Weight change:    Hemodynamic parameters for last 24 hours: PAP: (24-32)/(14-22) 24/15 mmHg CO:  [4.4 L/min-5.7 L/min] 4.4 L/min CI:  [2.1 L/min/m2-2.8 L/min/m2] 2.1 L/min/m2  Intake/Output from previous day: 04/30 0701 - 05/01 0700 In: 6441.5 [I.V.:4021.5; Blood:1920; IV Piggyback:500] Out: 4360 [Urine:2350; Blood:1800; Chest Tube:210]  Intake/Output this shift: Total I/O In: 262.5 [I.V.:112.5; IV Piggyback:150] Out: 270 [Urine:250; Chest Tube:20]  Current Meds: Scheduled Meds: . sodium chloride   Intravenous Once  . acetaminophen  1,000 mg Oral 4 times per day   Or  . acetaminophen (TYLENOL) oral liquid 160 mg/5 mL  1,000 mg Per Tube 4 times per day  . acetaminophen (TYLENOL) oral liquid 160 mg/5 mL  650 mg Per Tube Once   Or  . acetaminophen  650  mg Rectal Once  . antiseptic oral rinse  7 mL Mouth Rinse QID  . aspirin EC  325 mg Oral Daily   Or  . aspirin  324 mg Per Tube Daily  . bisacodyl  10 mg Oral Daily   Or  . bisacodyl  10 mg Rectal Daily  . cefUROXime (ZINACEF)  IV  1.5 g Intravenous Q12H  . chlorhexidine  15 mL Mouth Rinse BID  . coagulation factor VIIa recomb  45 mcg/kg Intravenous Once  . docusate sodium  200 mg Oral Daily  . famotidine (PEPCID) IV  20 mg Intravenous Q12H  . insulin regular  0-10 Units Intravenous TID WC  . metoCLOPramide (REGLAN) injection  10 mg Intravenous Q6H  . metoprolol tartrate  12.5 mg Oral BID   Or  . metoprolol tartrate  12.5 mg Per Tube BID  . [START ON 11/05/2014] pantoprazole  40 mg Oral Daily  . potassium chloride  10 mEq Intravenous Q1 Hr x 3  . sodium chloride  3 mL Intravenous Q12H   Continuous Infusions: . sodium chloride    . sodium chloride    . sodium chloride 20 mL/hr at 11/04/14 0100  . dexmedetomidine 0.5 mcg/kg/hr (11/04/14 0900)  . DOPamine Stopped (11/04/14 0100)  . insulin (NOVOLIN-R) infusion 2.4 Units/hr (11/04/14 0900)  . lactated ringers 20 mL/hr  at 11/04/14 0100  . lactated ringers 20 mL/hr at 11/04/14 0100  . nitroGLYCERIN 30 mcg/min (11/04/14 0900)  . phenylephrine (NEO-SYNEPHRINE) Adult infusion Stopped (11/04/14 0100)   PRN Meds:.sodium chloride, albumin human, lactated ringers, metoprolol, midazolam, morphine injection, morphine injection, ondansetron (ZOFRAN) IV, oxyCODONE, sodium chloride, traMADol  General appearance: cooperative, no distress and slowed mentation Neurologic: intact Heart: regular rate and rhythm, S1, S2 normal, no murmur, click, rub or gallop Lungs: diminished breath sounds bibasilar Abdomen: soft, non-tender; bowel sounds normal; no masses,  no organomegaly and firm abdominal mass unchanged Extremities: extremities normal, atraumatic, no cyanosis or edema and Homans sign is negative, no sign of DVT Wound: sternum stable  Lab  Results: CBC: Recent Labs  11/04/14 0133 11/04/14 0530  WBC 11.6* 8.8  HGB 9.9* 10.2*  HCT 28.8* 29.3*  PLT 168 179   BMET:  Recent Labs  11/03/14 1243  11/03/14 2347 11/04/14 0123 11/04/14 0530  NA 138  < > 135 139 139  K 3.2*  < > 3.9 3.2* 3.7  CL 103  < > 99  --  105  CO2 22  --   --   --  24  GLUCOSE 149*  < > 143* 139* 126*  BUN 16  < > 16  --  16  CREATININE 1.09  < > 1.10  --  1.26*  CALCIUM 9.3  --   --   --  7.6*  < > = values in this interval not displayed.  PT/INR:  Recent Labs  11/04/14 0133  LABPROT 16.9*  INR 1.36   Radiology:   Dg Chest Port 1 View  11/04/2014   CLINICAL DATA:  Postop from ascending aortic replacement for acute type 1 thoracic aortic dissection.  EXAM: PORTABLE CHEST - 1 VIEW  COMPARISON:  11/04/2014  FINDINGS: Support lines and tubes in appropriate position. Right upper lobe collapse has nearly completely resolved since previous study. Low lung volumes are again noted. No pneumothorax or definite pleural effusion visualized. Heart size remains within normal limits.  IMPRESSION: Near complete resolution of right upper lobe collapse since prior study. Persistent low lung volumes. No pneumothorax visualized.   Electronically Signed   By: Earle Gell M.D.   On: 11/04/2014 08:17   Assessment/Plan: S/P Procedure(s) (LRB): REPLACEMENT ASCENDING AORTA with a 45mm hemashield graft,  resuspension of aortic valve, hypothermic circulatory arrest and cardiopulmonary bypass. (N/A) Diabetes control Continue foley due to strict I&O, patient critically ill, patient in ICU and urinary output monitoring Cr /renal function stable  Wean off insulin drip Decrease fio2 as tolerated and wean vent  Expected Acute  Blood - loss Anemia  [name] 11/04/2014 9:33 AM

## 2014-11-04 NOTE — Progress Notes (Signed)
ABG done this morning. pH 7.4, pCO2 36, PO2 58, bicarb 24, O2 91. RT notified of results, and FiO2 increased from 60% to 80%. Precedex turned back up to 0.7 mcg. Will continue to monitor.  Vella Raring, RN

## 2014-11-05 ENCOUNTER — Inpatient Hospital Stay (HOSPITAL_COMMUNITY): Payer: Medicaid - Out of State

## 2014-11-05 ENCOUNTER — Encounter (HOSPITAL_COMMUNITY): Payer: Self-pay | Admitting: Cardiothoracic Surgery

## 2014-11-05 DIAGNOSIS — I7101 Dissection of thoracic aorta: Principal | ICD-10-CM

## 2014-11-05 LAB — GLUCOSE, CAPILLARY
Glucose-Capillary: 115 mg/dL — ABNORMAL HIGH (ref 70–99)
Glucose-Capillary: 119 mg/dL — ABNORMAL HIGH (ref 70–99)
Glucose-Capillary: 120 mg/dL — ABNORMAL HIGH (ref 70–99)
Glucose-Capillary: 120 mg/dL — ABNORMAL HIGH (ref 70–99)
Glucose-Capillary: 121 mg/dL — ABNORMAL HIGH (ref 70–99)
Glucose-Capillary: 88 mg/dL (ref 70–99)

## 2014-11-05 LAB — BASIC METABOLIC PANEL
Anion gap: 9 (ref 5–15)
BUN: 18 mg/dL (ref 6–20)
CO2: 26 mmol/L (ref 22–32)
Calcium: 8.1 mg/dL — ABNORMAL LOW (ref 8.9–10.3)
Chloride: 101 mmol/L (ref 101–111)
Creatinine, Ser: 1.1 mg/dL — ABNORMAL HIGH (ref 0.44–1.00)
GFR calc Af Amer: >60 mL/min (ref >60)
GFR calc non Af Amer: 58 mL/min — ABNORMAL LOW (ref >60)
Glucose, Bld: 123 mg/dL — ABNORMAL HIGH (ref 70–99)
Potassium: 3.6 mmol/L (ref 3.5–5.1)
Sodium: 136 mmol/L (ref 135–145)

## 2014-11-05 LAB — CBC
HCT: 30.6 % — ABNORMAL LOW (ref 36.0–46.0)
Hemoglobin: 10.3 g/dL — ABNORMAL LOW (ref 12.0–15.0)
MCH: 29.3 pg (ref 26.0–34.0)
MCHC: 33.7 g/dL (ref 30.0–36.0)
MCV: 87.2 fL (ref 78.0–100.0)
Platelets: 170 10*3/uL (ref 150–400)
RBC: 3.51 MIL/uL — ABNORMAL LOW (ref 3.87–5.11)
RDW: 16.9 % — ABNORMAL HIGH (ref 11.5–15.5)
WBC: 14.1 10*3/uL — ABNORMAL HIGH (ref 4.0–10.5)

## 2014-11-05 MED ORDER — METOPROLOL TARTRATE 25 MG/10 ML ORAL SUSPENSION
12.5000 mg | Freq: Two times a day (BID) | ORAL | Status: DC
  Filled 2014-11-05 (×4): qty 5

## 2014-11-05 MED ORDER — LABETALOL HCL 5 MG/ML IV SOLN
10.0000 mg | INTRAVENOUS | Status: DC | PRN
  Administered 2014-11-05 – 2014-11-06 (×4): 10 mg via INTRAVENOUS
  Filled 2014-11-05: qty 4

## 2014-11-05 MED ORDER — METOPROLOL TARTRATE 25 MG PO TABS
25.0000 mg | ORAL_TABLET | Freq: Two times a day (BID) | ORAL | Status: DC
  Administered 2014-11-05 (×2): 25 mg via ORAL
  Filled 2014-11-05 (×4): qty 1

## 2014-11-05 MED ORDER — AMLODIPINE BESYLATE 5 MG PO TABS
5.0000 mg | ORAL_TABLET | Freq: Every day | ORAL | Status: DC
  Administered 2014-11-05 – 2014-11-06 (×2): 5 mg via ORAL
  Filled 2014-11-05 (×4): qty 1

## 2014-11-05 MED ORDER — LABETALOL HCL 5 MG/ML IV SOLN
INTRAVENOUS | Status: AC
  Administered 2014-11-05: 10 mg via INTRAVENOUS
  Filled 2014-11-05: qty 4

## 2014-11-05 MED ORDER — GUAIFENESIN ER 600 MG PO TB12
600.0000 mg | ORAL_TABLET | Freq: Two times a day (BID) | ORAL | Status: DC
  Administered 2014-11-05 – 2014-11-12 (×15): 600 mg via ORAL
  Filled 2014-11-05 (×18): qty 1

## 2014-11-05 MED ORDER — FUROSEMIDE 10 MG/ML IJ SOLN
40.0000 mg | Freq: Once | INTRAMUSCULAR | Status: AC
  Administered 2014-11-05: 40 mg via INTRAVENOUS

## 2014-11-05 MED ORDER — POTASSIUM CHLORIDE 10 MEQ/50ML IV SOLN
10.0000 meq | INTRAVENOUS | Status: AC
  Administered 2014-11-05 (×3): 10 meq via INTRAVENOUS
  Filled 2014-11-05 (×3): qty 50

## 2014-11-05 MED FILL — Sodium Chloride IV Soln 0.9%: INTRAVENOUS | Qty: 2000 | Status: AC

## 2014-11-05 MED FILL — Heparin Sodium (Porcine) Inj 1000 Unit/ML: INTRAMUSCULAR | Qty: 10 | Status: AC

## 2014-11-05 MED FILL — Lidocaine HCl IV Inj 20 MG/ML: INTRAVENOUS | Qty: 10 | Status: AC

## 2014-11-05 MED FILL — Sodium Bicarbonate IV Soln 8.4%: INTRAVENOUS | Qty: 100 | Status: AC

## 2014-11-05 MED FILL — Electrolyte-R (PH 7.4) Solution: INTRAVENOUS | Qty: 5000 | Status: AC

## 2014-11-05 MED FILL — Mannitol IV Soln 20%: INTRAVENOUS | Qty: 500 | Status: AC

## 2014-11-05 NOTE — Progress Notes (Addendum)
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 (N/A)  Total Length of Stay:  LOS: 1 day   Subjective: OOB in chair.  Sore, some cough with thick mucus. Mild nausea this am, but tolerating clears.   Objective: Vital signs in last 24 hours: Temp:  [97.4 F (36.3 C)-99 F (37.2 C)] 97.4 F (36.3 C) (05/02 0400) Pulse Rate:  [65-77] 65 (05/02 0700) Cardiac Rhythm:  [-] Normal sinus rhythm (05/01 2000) Resp:  [12-28] 17 (05/02 0700) BP: (79-163)/(49-90) 158/73 mmHg (05/02 0700) SpO2:  [87 %-100 %] 96 % (05/02 0700) Arterial Line BP: (104-141)/(52-95) 128/95 mmHg (05/01 2100) FiO2 (%):  [40 %-80 %] 40 % (05/01 1240) Weight:  [228 lb 2.8 oz (103.5 kg)] 228 lb 2.8 oz (103.5 kg) (05/02 0500)  Filed Weights   11/03/14 1239 11/04/14 0500 11/05/14 0500  Weight: 256 lb (116.121 kg) 230 lb 9.6 oz (104.6 kg) 228 lb 2.8 oz (103.5 kg)    Weight change: -27 lb 13.2 oz (-12.621 kg)   Hemodynamic parameters for last 24 hours: PAP: (24-33)/(13-17) 33/14 mmHg CO:  [4.4 L/min-5 L/min] 4.4 L/min CI:  [2.1 L/min/m2-2.4 L/min/m2] 2.1 L/min/m2  Intake/Output from previous day: 05/01 0701 - 05/02 0700 In: 2201.8 [P.O.:980; I.V.:771.8; IV Piggyback:450] Out: 1525 [Urine:1245; Chest Tube:280]  CBGs 121-127-104-120-120-123      Current Meds: Scheduled Meds: . sodium chloride   Intravenous Once  . acetaminophen  1,000 mg Oral 4 times per day   Or  . acetaminophen (TYLENOL) oral liquid 160 mg/5 mL  1,000 mg Per Tube 4 times per day  . acetaminophen (TYLENOL) oral liquid 160 mg/5 mL  650 mg Per Tube Once   Or  . acetaminophen  650 mg Rectal Once  . antiseptic oral rinse  7 mL Mouth Rinse BID  . aspirin EC  325 mg Oral Daily   Or  . aspirin  324 mg Per Tube Daily  . bisacodyl  10 mg Oral Daily   Or  . bisacodyl  10 mg Rectal Daily  . cefUROXime (ZINACEF)  IV  1.5 g Intravenous Q12H  . docusate sodium  200 mg Oral Daily  . famotidine (PEPCID) IV  20 mg Intravenous Q12H  . insulin aspart  0-15 Units Subcutaneous 6 times per day  . insulin detemir  10 Units Subcutaneous Daily  . metoprolol tartrate  12.5 mg Oral BID   Or  . metoprolol tartrate  12.5 mg Per Tube BID  . pantoprazole  40 mg Oral Daily  . potassium chloride  10 mEq Intravenous Q1 Hr x 3  . sodium chloride  3 mL Intravenous Q12H   Continuous Infusions: . sodium chloride Stopped (11/04/14 1600)  . sodium chloride    . sodium chloride 20 mL/hr at 11/04/14 0100  . dexmedetomidine Stopped (11/04/14 1230)  . DOPamine Stopped (11/04/14 0100)  . lactated ringers 20 mL/hr at 11/04/14 0100  . lactated ringers 20 mL/hr at 11/04/14 0100  . nitroGLYCERIN Stopped (11/04/14 1400)  . phenylephrine (NEO-SYNEPHRINE) Adult infusion Stopped (11/04/14 0100)   PRN Meds:.sodium chloride, metoprolol, midazolam, morphine injection, ondansetron (ZOFRAN)  IV, oxyCODONE, sodium chloride, traMADol  Physical Exam: General appearance: alert, cooperative and no distress Heart: regular rate and rhythm Lungs: diminished breath sounds bibasilar Extremities: No significant LE edema Wound: Dressed and dry  Lab Results: CBC: Recent Labs  11/04/14 1708 11/04/14 1715 11/05/14 0400  WBC 11.9*  --  14.1*  HGB 10.3* 11.6* 10.3*  HCT 30.7* 34.0* 30.6*  PLT 157  --  170   BMET:  Recent Labs  11/04/14 0530  11/04/14 1715 11/05/14 0400  NA 139  --  140 136  K 3.7  --  3.4* 3.6  CL 105  --  102 101  CO2 24  --   --  26  GLUCOSE 126*  --  127* 123*  BUN 16  --  18 18  CREATININE 1.26*  < > 1.00 1.10*  CALCIUM 7.6*  --   --  8.1*  < > = values in this interval not displayed.  PT/INR:  Recent Labs  11/04/14 0133  LABPROT 16.9*  INR 1.36   Radiology:    FINDINGS: There is a right jugular sheath. The Swan-Ganz catheter has been removed. The endotracheal tube and nasogastric tube have been removed. There is a left-sided mediastinal drain.  There is no pneumothorax. There is mild left base atelectasis or infiltrate. The right lung is nearly completely clear with only minimal residual linear right upper lobe opacities, improved.  IMPRESSION: Support equipment appears satisfactorily positioned, with interval removal of the ET tube, NG tube, Swan-Ganz catheter and 1 of the mediastinal drains.  Continued improvement in the right upper lobe airspace opacity. Unchanged left base airspace opacity.   Assessment/Plan: S/P Procedure(s) (LRB): REPLACEMENT ASCENDING AORTA with a 36mm hemashield graft,  resuspension of aortic valve, hypothermic circulatory arrest and cardiopulmonary bypass. (N/A)  CV- HTN requiring NTG gtt. Will increase Lopressor, wean NTG.  Pulm- extubated yesterday, stable on 2 L. Continue pulm toilet, will add flutter valve and Mucinex.  Endocrine- CBGs stable on low dose Levemir.   Mild leukocytosis, no fever. Probably L basilar atelectasis. Follow labs.  Mobilize, hopefully can d/c CTs soon. Leave Foley one more day to measure I/Os.   COLLINS,GINA H 11/05/2014 7:54 AM  D/c mt's today Continue oral agent BP controll Patient will stay in [place] will need cardiology follow up- bp control Cardiology consult  I have seen and examined [facility] and agree with the above assessment  and plan.  [name] MD [facility] [phone] Office [phone] 11/05/2014 9:53 AM

## 2014-11-05 NOTE — Consult Note (Signed)
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  No acute need for vascular surgical intervention.  Will sign off at this point.  Ruta Hinds, MD [facility] Office: [phone] Pager: [phone]

## 2014-11-05 NOTE — Progress Notes (Signed)
[facility] BRIEF SICU PROGRESS NOTE  2 Days Post-Op  S/P Procedure(s) (LRB): REPLACEMENT ASCENDING AORTA with a 77mm hemashield graft,  resuspension of aortic valve, hypothermic circulatory arrest and cardiopulmonary bypass. (N/A)   Stable day although remains hypertensive  Plan: Will add labetalol as needed to keep BP under better control  Rexene Alberts 11/05/2014 7:23 PM

## 2014-11-05 NOTE — Consult Note (Signed)
CARDIOLOGY CONSULT NOTE   Patient ID: Ashley Guerrero MRN: [id number], DOB/AGE: [birth year]   Admit date: 11/03/2014 Date of Consult: 11/05/2014   Primary Physician: Pcp Not In System Primary Cardiologist: None  Pt. Profile  49 year old African-American woman who currently lives in [place] and who visits her elderly father monthly here in [place].  Admitted with aortic dissection of the ascending aorta.  Problem List  Past Medical History  Diagnosis Date  . Hypertension   . Fibroid uterus     History reviewed. No pertinent past surgical history.   Allergies  No Known Allergies  HPI   This 49 year old African-American woman is postoperative replacement of the ascending aorta with a 30 mm Hemashield graft, resuspension of aortic valve, hypothermic circulatory arrest and cardiopulmonary bypass.  She was admitted on 11/03/14 with initial dizziness followed by precordial chest pain.  She has a long history of high blood pressure dating back about 10 years.  She previously had been on amlodipine.  She had stopped taking her blood pressure medication about 6 months ago because she did not think that she still needed it.  He would check her blood pressure from time to time and it would normally be okay.  Her father has congestive heart failure and she has been preparing meals for both of them using a low-salt diet approach.  She does not have any history of known ischemic heart disease or angina pectoris.  She does not have any history of arrhythmia or syncope.  She denies any history of diabetes. Her past medical history reveals that she is perimenopausal.  She has a history of hot flashes and night sweats.  She has known large fibroid tumors.  She denies excessive menorrhagia. Her family history reveals that her mother died of cancer but had had high blood pressure and her father has a history of heart failure and possibly high blood pressure  Inpatient Medications  . sodium  chloride   Intravenous Once  . acetaminophen  1,000 mg Oral 4 times per day   Or  . acetaminophen (TYLENOL) oral liquid 160 mg/5 mL  1,000 mg Per Tube 4 times per day  . acetaminophen (TYLENOL) oral liquid 160 mg/5 mL  650 mg Per Tube Once   Or  . acetaminophen  650 mg Rectal Once  . antiseptic oral rinse  7 mL Mouth Rinse BID  . aspirin EC  325 mg Oral Daily   Or  . aspirin  324 mg Per Tube Daily  . bisacodyl  10 mg Oral Daily   Or  . bisacodyl  10 mg Rectal Daily  . cefUROXime (ZINACEF)  IV  1.5 g Intravenous Q12H  . docusate sodium  200 mg Oral Daily  . guaiFENesin  600 mg Oral BID  . insulin aspart  0-15 Units Subcutaneous 6 times per day  . insulin detemir  10 Units Subcutaneous Daily  . metoprolol tartrate  25 mg Oral BID   Or  . metoprolol tartrate  12.5 mg Per Tube BID  . pantoprazole  40 mg Oral Daily  . sodium chloride  3 mL Intravenous Q12H    Family History History reviewed.  Positive for high blood pressure and congestive heart failure.  Social History History   Social History  . Marital Status: Single    Spouse Name: N/A  . Number of Children: N/A  . Years of Education: N/A   Occupational History  . Not on file.   Social History Main Topics  .  Smoking status: Current Every Day Smoker -- 1.00 packs/day    Types: Cigarettes  . Smokeless tobacco: Not on file  . Alcohol Use: Yes     Comment: occasional  . Drug Use: Yes    Special: Marijuana  . Sexual Activity: Not Currently   Other Topics Concern  . Not on file   Social History Narrative  . No narrative on file     Review of Systems  General:  No chills, fever, night sweats or weight changes.  Cardiovascular:  No chest pain, dyspnea on exertion, edema, orthopnea, palpitations, paroxysmal nocturnal dyspnea. Dermatological: No rash, lesions/masses Respiratory: No cough, dyspnea Urologic: No hematuria, dysuria.  History of fibroid tumors of the uterus Abdominal:   No nausea, vomiting, diarrhea,  bright red blood per rectum, melena, or hematemesis Neurologic:  No visual changes, wkns, changes in mental status. All other systems reviewed and are otherwise negative except as noted above.  Physical Exam  Blood pressure 174/78, pulse 70, temperature 97.7 F (36.5 C), temperature source Oral, resp. rate 30, height 5' (1.524 m), weight 228 lb 2.8 oz (103.5 kg), last menstrual period 10/29/2014, SpO2 97 %.  General: Pleasant, NAD Psych: Normal affect. Neuro: Alert and oriented X 3. Moves all extremities spontaneously. HEENT: Normal  Neck: Supple without bruits or JVD. Lungs:  Resp regular and unlabored, CTA.  Chest tubes in place. Heart: RRR no s3, s4, or murmurs. Abdomen: Soft, non-tender, non-distended, BS + x 4.  Extremities: No clubbing, cyanosis or edema. DP/PT/Radials 2+ and equal bilaterally.  Labs  No results for input(s): CKTOTAL, CKMB, TROPONINI in the last 72 hours. Lab Results  Component Value Date   WBC 14.1* 11/05/2014   HGB 10.3* 11/05/2014   HCT 30.6* 11/05/2014   MCV 87.2 11/05/2014   PLT 170 11/05/2014    Recent Labs Lab 11/03/14 1243  11/05/14 0400  NA 138  < > 136  K 3.2*  < > 3.6  CL 103  < > 101  CO2 22  < > 26  BUN 16  < > 18  CREATININE 1.09  < > 1.10*  CALCIUM 9.3  < > 8.1*  PROT 7.8  --   --   BILITOT 0.3  --   --   ALKPHOS 74  --   --   ALT 14  --   --   AST 17  --   --   GLUCOSE 149*  < > 123*  < > = values in this interval not displayed. No results found for: CHOL, HDL, LDLCALC, TRIG No results found for: DDIMER  Radiology/Studies  Ct Abdomen Pelvis W Contrast  11/03/2014   CLINICAL DATA:  Abdominal pain.  EXAM: CT ABDOMEN AND PELVIS WITH CONTRAST  TECHNIQUE: Multidetector CT imaging of the abdomen and pelvis was performed using the standard protocol following bolus administration of intravenous contrast.  CONTRAST:  19mL OMNIPAQUE IOHEXOL 300 MG/ML  SOLN  COMPARISON:  None.  FINDINGS: Lower chest: The lung bases are unremarkable.   Heart size is normal.  Upper abdomen: No focal abnormality identified within the liver, spleen, pancreas, or adrenal glands. There are extrarenal pelves bilaterally. There is slight delay in enhancement of the right kidney, possibly related to delayed arterial inflow. See below.  Gastrointestinal tract: The stomach and small bowel loops are nondilated the compressed primarily within the right upper quadrant. Colonic loops are normal in appearance. The appendix is not well seen but may be obscured or absent.  Pelvis: Extending from the  pelvis into the abdomen there is a large heterogeneous solid mass extending out superior portion of the uterus and measuring at least 27 x 27 x 13.4 cm. The urinary bladder is decompressed but has a thickened wall. No definite ovarian mass identified. There is no free pelvic fluid.  Retroperitoneum: There is an abdominal aortic dissection which begins at the upper most cuts of the study and extends into the left iliac artery. Dissection flap extends into the celiac axis and superior mesenteric artery. Is difficult to assess the integrity of the renal arteries given the contrast bolus which was performed for general abdominal imaging. Contrast is identified within the inferior mesenteric artery.  Abdominal wall: Unremarkable.  Osseous structures: There are moderate degenerative changes in the lower thoracic and lower lumbar spine. No suspicious lytic or blastic lesions are identified.  IMPRESSION: 1. Abdominal aortic dissection extending into the celiac axis, superior mesenteric artery, and left iliac artery. Because of the timing of the contrast bolus, is difficult to evaluate integrity of the abdominal vessels. 2. Consider CT angiography of the chest and abdomen for further characterization. 3. There is probable delayed nephrogram right kidney, raising suspicion for vascular compromise right renal artery. 4. Large pelvic mass extending from the fundus of the uterus consistent with  large uterine fibroids, measuring at least 27 cm. 5. Critical Value/emergent results were called by telephone at the time of interpretation on 11/03/2014 at 2:56 pm to Dr. Davonna Belling , who verbally acknowledged these results.   Electronically Signed   By: Nolon Nations M.D.   On: 11/03/2014 15:01   Dg Chest Port 1 View  11/05/2014   CLINICAL DATA:  History of aortic aneurysm and dissection. Left-sided chest pains, onset this morning.  EXAM: PORTABLE CHEST - 1 VIEW  COMPARISON:  11/04/2014  FINDINGS: There is a right jugular sheath. The Swan-Ganz catheter has been removed. The endotracheal tube and nasogastric tube have been removed. There is a left-sided mediastinal drain.  There is no pneumothorax. There is mild left base atelectasis or infiltrate. The right lung is nearly completely clear with only minimal residual linear right upper lobe opacities, improved.  IMPRESSION: Support equipment appears satisfactorily positioned, with interval removal of the ET tube, NG tube, Swan-Ganz catheter and 1 of the mediastinal drains.  Continued improvement in the right upper lobe airspace opacity. Unchanged left base airspace opacity.   Electronically Signed   By: Andreas Newport M.D.   On: 11/05/2014 06:38   Dg Chest Port 1 View  11/04/2014   CLINICAL DATA:  Postop from ascending aortic replacement for acute type 1 thoracic aortic dissection.  EXAM: PORTABLE CHEST - 1 VIEW  COMPARISON:  11/04/2014  FINDINGS: Support lines and tubes in appropriate position. Right upper lobe collapse has nearly completely resolved since previous study. Low lung volumes are again noted. No pneumothorax or definite pleural effusion visualized. Heart size remains within normal limits.  IMPRESSION: Near complete resolution of right upper lobe collapse since prior study. Persistent low lung volumes. No pneumothorax visualized.   Electronically Signed   By: Earle Gell M.D.   On: 11/04/2014 08:17   Dg Chest Port 1 View  11/04/2014    CLINICAL DATA:  Postoperative chest radiograph, status post surgery for ascending thoracic aortic dissection. Initial encounter.  EXAM: PORTABLE CHEST - 1 VIEW  COMPARISON:  None.  FINDINGS: The patient's endotracheal tube is seen ending 2 cm above the carina. An enteric tube is noted extending below the diaphragm. A right IJ Swan-Ganz  catheter is noted ending at the right main pulmonary artery. Mediastinal drains are noted.  There is opacification of the right upper lobe, likely reflecting dense atelectasis. The lungs are otherwise relatively clear. No pleural effusion or pneumothorax is seen.  The cardiomediastinal silhouette is mildly enlarged. The patient is status post median sternotomy. No acute osseous abnormalities are identified.  IMPRESSION: 1. Endotracheal tube seen ending 2 cm above the carina. Tubes and lines as described above. 2. Opacification of the right upper lung lobe likely reflects atelectasis. Lungs otherwise clear. 3. Mild cardiomegaly.   Electronically Signed   By: Garald Balding M.D.   On: 11/04/2014 01:58   Dg Abd Acute W/chest  11/03/2014   CLINICAL DATA:  Generalized abdominal pain.  Diarrhea.  EXAM: DG ABDOMEN ACUTE W/ 1V CHEST  COMPARISON:  None.  FINDINGS: There is no evidence of dilated bowel loops or free intraperitoneal air. No radiopaque calculi identified. Lower lumbar spine degenerative changes noted.  Heart size and mediastinal contours are within normal limits. Mild tortuosity of thoracic aorta is noted. Both lungs are clear. No evidence of pneumothorax or pleural effusion.  IMPRESSION: Normal bowel gas pattern.  No active cardiopulmonary disease.   Electronically Signed   By: Earle Gell M.D.   On: 11/03/2014 16:49   Ct Angio Chest Aorta W/cm &/or Wo/cm  11/03/2014   CLINICAL DATA:  49 year old female presents with acute abdominal pain after a meal with nausea and vomiting.  EXAM: CT ANGIOGRAPHY CHEST, ABDOMEN AND PELVIS  TECHNIQUE: Multidetector CT imaging through the  chest, abdomen and pelvis was performed using the standard protocol during bolus administration of intravenous contrast. Multiplanar reconstructed images and MIPs were obtained and reviewed to evaluate the vascular anatomy.  CONTRAST:  138mL OMNIPAQUE IOHEXOL 350 MG/ML SOLN  COMPARISON:  Abdominal CT 11/03/2014, performed same day.  FINDINGS: Nonvascular chest:  Unremarkable chest wall.  No mediastinal adenopathy.  Heart size within normal limits. No pericardial fluid. Small hiatal hernia.  Minimal atelectasis bilateral lung bases. No pleural effusion or pneumothorax. No confluent airspace disease.  CTA CHEST FINDINGS  Aorta:  Dissection flap involving the ascending and descending aorta.  The origination of the dissection flap appears to be the sino-tubular junction. The origin of the left coronary artery and right coronary artery are patent. Dissection flap extends through the aortic arch, involving the origin of the branch vessels.  Irregular soft tissue at the origin of the innominate artery which remains patent. Tortuosity of the innominate artery with the right common carotid artery and subclavian artery patent.  Dissection flap extends across the origin of the left common carotid artery and the origin of the left subclavian artery.  The dissection flap does not appear to extend into the left common carotid artery, though does extend into the origin of the left subclavian artery approximately 2-3 cm.  Separate origin of the left vertebral artery which remains patent.  Dissection flap extends through the length of descending thoracic aorta, with both the false lumen and true lumen opacified.  Diameter of the ascending aorta measures 4.6 cm. Diameter of the descending thoracic aorta in the mid segment measures 3 cm. Diameter at the aortic hiatus 2.5 cm.  No filling defects identified within the pulmonary arterial tree.  The distal pulmonary arteries are larger than the accompanying bronchi, and extend towards the  peripheral 1/3 of the lungs.  Review of the MIP images confirms the above findings.  Nonvascular abdomen/pelvis:  Unremarkable liver and spleen.  Unremarkable adrenal glands.  Unremarkable pancreas and gallbladder.  No left or right-sided hydronephrosis.  No nephrolithiasis.  Soft tissue mass with a significant internal vasculature and multiple lobulated components within the right abdomen and pelvis. The mass displaces small bowel and colon. Greatest diameter 25.3 cm x 12.2 cm. The mass is continuous with the superior aspect of the uterus. And the bilateral uterine arteries are hypertrophied and contributing to the body of this soft tissue mass. Right-sided ovarian artery is hypertrophy contributing to soft tissue mass of the abdomen.  No evidence of abnormally distended small bowel or colon.  Urinary bladder partially distended with the urine/excreted contrast.  CTA ABDOMEN AND PELVIS FINDINGS  Aorta and mesenteric vessels:  Dissection flap extends through the abdominal aorta, and appears to terminate in the proximal left common iliac artery at the origin of the hypogastric artery.  Greatest diameter of the infrarenal abdominal aorta measures approximately 23 mm.  Dissection flap enters the origin of the celiac artery, which remains patent.  Dissection flap enters the origin of the superior mesenteric artery extending approximately 8-10 cm into the origin. Distal superior mesenteric artery and its branches are patent.  The inferior mesenteric artery remains patent.  Bilateral single renal arteries remain patent.  Right lower extremity:  Dissection flap terminates at the origin the right common iliac artery. Greatest diameter of the right common iliac artery measures 1.4 cm. Tortuosity of the iliac system. Right hypogastric artery remains patent. No aneurysmal dilation of the external iliac artery. Right common femoral artery remains patent.  Left lower extremity:  Dissection flap extends into the left common iliac  artery, terminating at the hypogastric origin. Greatest diameter of the left common iliac artery measures 1.4 cm.  Hypogastric artery remains patent. Tortuosity of the left iliac system. No aneurysm the left external iliac artery. No significant atherosclerotic disease. Left common femoral artery remains patent.  Review of the MIP images confirms the above findings.  IMPRESSION: Type A aortic dissection, with the following features:  A) origination at the sino-tubular junction with patency of left and right coronary arteries.  B) associated aneurysm of the ascending aorta measuring 4.6 cm  C) dissection flap extending across the origin of the branch vessels, which are filling and remain patent. The dissection flap extends into left subclavian artery approximately 3 cm. There is redundant soft tissue at the origin of the innominate artery, suggesting that the dissection flap tissue has become redundant at the origin.  D) separate origin of the left vertebral artery from the aortic arch. Vertebral artery remains patent.  E) all of the mesenteric vessels remain patent, though the dissection flap crosses the origin of the celiac artery, superior mesenteric artery, and bilateral renal arteries.  F) dissection flap extends at least 10 cm into the origin of the superior mesenteric artery which remains patent.  G) flap terminates within the left iliac artery at the origin of the hypogastric artery.  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  Evidence of pulmonary artery hypertension as the pulmonary arteries extend to the distal 1/3 of the lungs, and are larger than the accompanying bronchi.  Large right-sided abdominal/pelvic soft tissue mass displacing small bowel, continuous with the dome of the uterus and favored to be uterine origin given the hypertrophied, contributing bilateral uterine arteries and right ovarian artery.  Preliminary results were called by telephone at the  time of interpretation on 11/03/2014 at 4:52 pm to Dr. Davonna Belling , who verbally acknowledged these results.  Signed,  Dulcy Fanny. Earleen Newport, DO  [facility]  [facility]   Electronically Signed   By: Corrie Mckusick D.O.   On: 11/03/2014 17:27   Ct Angio Abd/pel W/ And/or W/o  11/03/2014   CLINICAL DATA:  49 year old female presents with acute abdominal pain after a meal with nausea and vomiting.  EXAM: CT ANGIOGRAPHY CHEST, ABDOMEN AND PELVIS  TECHNIQUE: Multidetector CT imaging through the chest, abdomen and pelvis was performed using the standard protocol during bolus administration of intravenous contrast. Multiplanar reconstructed images and MIPs were obtained and reviewed to evaluate the vascular anatomy.  CONTRAST:  174mL OMNIPAQUE IOHEXOL 350 MG/ML SOLN  COMPARISON:  Abdominal CT 11/03/2014, performed same day.  FINDINGS: Nonvascular chest:  Unremarkable chest wall.  No mediastinal adenopathy.  Heart size within normal limits. No pericardial fluid. Small hiatal hernia.  Minimal atelectasis bilateral lung bases. No pleural effusion or pneumothorax. No confluent airspace disease.  CTA CHEST FINDINGS  Aorta:  Dissection flap involving the ascending and descending aorta.  The origination of the dissection flap appears to be the sino-tubular junction. The origin of the left coronary artery and right coronary artery are patent. Dissection flap extends through the aortic arch, involving the origin of the branch vessels.  Irregular soft tissue at the origin of the innominate artery which remains patent. Tortuosity of the innominate artery with the right common carotid artery and subclavian artery patent.  Dissection flap extends across the origin of the left common carotid artery and the origin of the left subclavian artery.  The dissection flap does not appear to extend into the left common carotid artery, though does extend into the origin of the left subclavian  artery approximately 2-3 cm.  Separate origin of the left vertebral artery which remains patent.  Dissection flap extends through the length of descending thoracic aorta, with both the false lumen and true lumen opacified.  Diameter of the ascending aorta measures 4.6 cm. Diameter of the descending thoracic aorta in the mid segment measures 3 cm. Diameter at the aortic hiatus 2.5 cm.  No filling defects identified within the pulmonary arterial tree.  The distal pulmonary arteries are larger than the accompanying bronchi, and extend towards the peripheral 1/3 of the lungs.  Review of the MIP images confirms the above findings.  Nonvascular abdomen/pelvis:  Unremarkable liver and spleen.  Unremarkable adrenal glands.  Unremarkable pancreas and gallbladder.  No left or right-sided hydronephrosis.  No nephrolithiasis.  Soft tissue mass with a significant internal vasculature and multiple lobulated components within the right abdomen and pelvis. The mass displaces small bowel and colon. Greatest diameter 25.3 cm x 12.2 cm. The mass is continuous with the superior aspect of the uterus. And the bilateral uterine arteries are hypertrophied and contributing to the body of this soft tissue mass. Right-sided ovarian artery is hypertrophy contributing to soft tissue mass of the abdomen.  No evidence of abnormally distended small bowel or colon.  Urinary bladder partially distended with the urine/excreted contrast.  CTA ABDOMEN AND PELVIS FINDINGS  Aorta and mesenteric vessels:  Dissection flap extends through the abdominal aorta, and appears to terminate in the proximal left common iliac artery at the origin of the hypogastric artery.  Greatest diameter of the infrarenal abdominal aorta measures approximately 23 mm.  Dissection flap enters the origin of the celiac artery, which remains patent.  Dissection flap enters the origin of the superior mesenteric artery extending approximately 8-10 cm into the origin. Distal superior  mesenteric  artery and its branches are patent.  The inferior mesenteric artery remains patent.  Bilateral single renal arteries remain patent.  Right lower extremity:  Dissection flap terminates at the origin the right common iliac artery. Greatest diameter of the right common iliac artery measures 1.4 cm. Tortuosity of the iliac system. Right hypogastric artery remains patent. No aneurysmal dilation of the external iliac artery. Right common femoral artery remains patent.  Left lower extremity:  Dissection flap extends into the left common iliac artery, terminating at the hypogastric origin. Greatest diameter of the left common iliac artery measures 1.4 cm.  Hypogastric artery remains patent. Tortuosity of the left iliac system. No aneurysm the left external iliac artery. No significant atherosclerotic disease. Left common femoral artery remains patent.  Review of the MIP images confirms the above findings.  IMPRESSION: Type A aortic dissection, with the following features:  A) origination at the sino-tubular junction with patency of left and right coronary arteries.  B) associated aneurysm of the ascending aorta measuring 4.6 cm  C) dissection flap extending across the origin of the branch vessels, which are filling and remain patent. The dissection flap extends into left subclavian artery approximately 3 cm. There is redundant soft tissue at the origin of the innominate artery, suggesting that the dissection flap tissue has become redundant at the origin.  D) separate origin of the left vertebral artery from the aortic arch. Vertebral artery remains patent.  E) all of the mesenteric vessels remain patent, though the dissection flap crosses the origin of the celiac artery, superior mesenteric artery, and bilateral renal arteries.  F) dissection flap extends at least 10 cm into the origin of the superior mesenteric artery which remains patent.  G) flap terminates within the left iliac artery at the origin of the  hypogastric artery.  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  Evidence of pulmonary artery hypertension as the pulmonary arteries extend to the distal 1/3 of the lungs, and are larger than the accompanying bronchi.  Large right-sided abdominal/pelvic soft tissue mass displacing small bowel, continuous with the dome of the uterus and favored to be uterine origin given the hypertrophied, contributing bilateral uterine arteries and right ovarian artery.  Preliminary results were called by telephone at the time of interpretation on 11/03/2014 at 4:52 pm to Dr. Davonna Belling , who verbally acknowledged these results.  Signed,  Dulcy Fanny. Earleen Newport, DO  [facility] [facility]  [facility] Radiology   Electronically Signed   By: Corrie Mckusick D.O.   On: 11/03/2014 17:27    ECG  04-Nov-2014 01:45:41 [facility] ROUTINE RECORD Sinus rhythm with Fusion complexes Left ventricular hypertrophy with QRS widening Abnormal ECG Increased rate , fusion beats new since 11/03/14 Lateral T waves improved Confirmed by [facility] MD, PETER [phone]) on 11/04/2014 11:47:01 AM  ASSESSMENT AND PLAN  1.  Essential hypertension 2.  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 3.  History of tobacco abuse  Plan: Continue beta blocker.  Blood pressure systolic is still running high and we will add amlodipine 5 mg daily to her regimen.  She has been on amlodipine in the past successfully.  Smoking cessation. Will follow with you  Signed, Darlin Coco MD  11/05/2014, 10:28 AM

## 2014-11-05 NOTE — Addendum Note (Signed)
Addendum  created 11/05/14 1508 by Josephine Igo, CRNA   Modules edited: Anesthesia Medication Administration

## 2014-11-06 ENCOUNTER — Inpatient Hospital Stay (HOSPITAL_COMMUNITY): Payer: Medicaid - Out of State

## 2014-11-06 DIAGNOSIS — Z95828 Presence of other vascular implants and grafts: Secondary | ICD-10-CM

## 2014-11-06 DIAGNOSIS — I10 Essential (primary) hypertension: Secondary | ICD-10-CM

## 2014-11-06 LAB — BASIC METABOLIC PANEL
ANION GAP: 7 (ref 5–15)
BUN: 19 mg/dL (ref 6–20)
CALCIUM: 8.3 mg/dL — AB (ref 8.9–10.3)
CO2: 28 mmol/L (ref 22–32)
Chloride: 99 mmol/L — ABNORMAL LOW (ref 101–111)
Creatinine, Ser: 0.89 mg/dL (ref 0.44–1.00)
Glucose, Bld: 97 mg/dL (ref 70–99)
POTASSIUM: 3.5 mmol/L (ref 3.5–5.1)
SODIUM: 134 mmol/L — AB (ref 135–145)

## 2014-11-06 LAB — CBC
HCT: 28.8 % — ABNORMAL LOW (ref 36.0–46.0)
HEMOGLOBIN: 9.7 g/dL — AB (ref 12.0–15.0)
MCH: 30 pg (ref 26.0–34.0)
MCHC: 33.7 g/dL (ref 30.0–36.0)
MCV: 89.2 fL (ref 78.0–100.0)
PLATELETS: 153 10*3/uL (ref 150–400)
RBC: 3.23 MIL/uL — AB (ref 3.87–5.11)
RDW: 16.5 % — ABNORMAL HIGH (ref 11.5–15.5)
WBC: 11.4 10*3/uL — ABNORMAL HIGH (ref 4.0–10.5)

## 2014-11-06 LAB — GLUCOSE, CAPILLARY
Glucose-Capillary: 100 mg/dL — ABNORMAL HIGH (ref 70–99)
Glucose-Capillary: 110 mg/dL — ABNORMAL HIGH (ref 70–99)
Glucose-Capillary: 113 mg/dL — ABNORMAL HIGH (ref 70–99)
Glucose-Capillary: 116 mg/dL — ABNORMAL HIGH (ref 70–99)
Glucose-Capillary: 87 mg/dL (ref 70–99)
Glucose-Capillary: 90 mg/dL (ref 70–99)

## 2014-11-06 MED ORDER — TRAMADOL HCL 50 MG PO TABS
50.0000 mg | ORAL_TABLET | ORAL | Status: DC | PRN
  Administered 2014-11-07 (×3): 100 mg via ORAL
  Administered 2014-11-08: 50 mg via ORAL
  Administered 2014-11-08 (×2): 100 mg via ORAL
  Administered 2014-11-09 (×2): 50 mg via ORAL
  Administered 2014-11-10 – 2014-11-11 (×5): 100 mg via ORAL
  Filled 2014-11-06: qty 2
  Filled 2014-11-06 (×3): qty 1
  Filled 2014-11-06 (×9): qty 2
  Filled 2014-11-06: qty 1

## 2014-11-06 MED ORDER — ASPIRIN EC 325 MG PO TBEC
325.0000 mg | DELAYED_RELEASE_TABLET | Freq: Every day | ORAL | Status: DC
  Administered 2014-11-06 – 2014-11-12 (×7): 325 mg via ORAL
  Filled 2014-11-06 (×7): qty 1

## 2014-11-06 MED ORDER — ONDANSETRON HCL 4 MG/2ML IJ SOLN
4.0000 mg | Freq: Four times a day (QID) | INTRAMUSCULAR | Status: DC | PRN
  Administered 2014-11-07: 4 mg via INTRAVENOUS
  Filled 2014-11-06: qty 2

## 2014-11-06 MED ORDER — SODIUM CHLORIDE 0.9 % IJ SOLN
3.0000 mL | Freq: Two times a day (BID) | INTRAMUSCULAR | Status: DC
  Administered 2014-11-06 – 2014-11-11 (×10): 3 mL via INTRAVENOUS

## 2014-11-06 MED ORDER — DOCUSATE SODIUM 100 MG PO CAPS
200.0000 mg | ORAL_CAPSULE | Freq: Every day | ORAL | Status: DC
  Administered 2014-11-06 – 2014-11-12 (×7): 200 mg via ORAL
  Filled 2014-11-06 (×8): qty 2

## 2014-11-06 MED ORDER — AMLODIPINE BESYLATE 5 MG PO TABS
5.0000 mg | ORAL_TABLET | Freq: Once | ORAL | Status: AC
  Administered 2014-11-06: 5 mg via ORAL
  Filled 2014-11-06: qty 1

## 2014-11-06 MED ORDER — LABETALOL HCL 5 MG/ML IV SOLN
20.0000 mg | INTRAVENOUS | Status: DC | PRN
  Filled 2014-11-06: qty 4

## 2014-11-06 MED ORDER — PANTOPRAZOLE SODIUM 40 MG PO TBEC
40.0000 mg | DELAYED_RELEASE_TABLET | Freq: Every day | ORAL | Status: DC
  Administered 2014-11-06 – 2014-11-12 (×7): 40 mg via ORAL
  Filled 2014-11-06 (×7): qty 1

## 2014-11-06 MED ORDER — LABETALOL HCL 5 MG/ML IV SOLN
20.0000 mg | Freq: Once | INTRAVENOUS | Status: AC
  Administered 2014-11-06: 20 mg via INTRAVENOUS

## 2014-11-06 MED ORDER — POTASSIUM CHLORIDE 10 MEQ/50ML IV SOLN
10.0000 meq | Freq: Once | INTRAVENOUS | Status: AC
  Administered 2014-11-06: 10 meq via INTRAVENOUS

## 2014-11-06 MED ORDER — AMLODIPINE BESYLATE 10 MG PO TABS
10.0000 mg | ORAL_TABLET | Freq: Every day | ORAL | Status: DC
  Administered 2014-11-07 – 2014-11-12 (×6): 10 mg via ORAL
  Filled 2014-11-06 (×6): qty 1

## 2014-11-06 MED ORDER — MAGNESIUM HYDROXIDE 400 MG/5ML PO SUSP: 30.0000 mL | Freq: Every day | ORAL | Status: DC | PRN

## 2014-11-06 MED ORDER — SODIUM CHLORIDE 0.9 % IJ SOLN: 3.0000 mL | INTRAMUSCULAR | Status: DC | PRN

## 2014-11-06 MED ORDER — METOPROLOL TARTRATE 50 MG PO TABS
50.0000 mg | ORAL_TABLET | Freq: Two times a day (BID) | ORAL | Status: DC
  Administered 2014-11-06: 50 mg via ORAL
  Filled 2014-11-06 (×3): qty 1

## 2014-11-06 MED ORDER — LABETALOL HCL 5 MG/ML IV SOLN
INTRAVENOUS | Status: AC
  Filled 2014-11-06: qty 4

## 2014-11-06 MED ORDER — MOVING RIGHT ALONG BOOK
Freq: Once | Status: AC
Start: 2014-11-06 — End: 2014-11-06
  Administered 2014-11-06: 08:00:00
  Filled 2014-11-06: qty 1

## 2014-11-06 MED ORDER — ONDANSETRON HCL 4 MG PO TABS
4.0000 mg | ORAL_TABLET | Freq: Four times a day (QID) | ORAL | Status: DC | PRN
  Administered 2014-11-08 (×2): 4 mg via ORAL
  Filled 2014-11-06 (×2): qty 1

## 2014-11-06 MED ORDER — BISACODYL 10 MG RE SUPP: 10.0000 mg | Freq: Every day | RECTAL | Status: DC | PRN

## 2014-11-06 MED ORDER — ENOXAPARIN SODIUM 30 MG/0.3ML ~~LOC~~ SOLN
30.0000 mg | SUBCUTANEOUS | Status: DC
  Administered 2014-11-06 – 2014-11-12 (×7): 30 mg via SUBCUTANEOUS
  Filled 2014-11-06 (×10): qty 0.3

## 2014-11-06 MED ORDER — SODIUM CHLORIDE 0.9 % IV SOLN: 250.0000 mL | INTRAVENOUS | Status: DC | PRN

## 2014-11-06 MED ORDER — BISACODYL 5 MG PO TBEC
10.0000 mg | DELAYED_RELEASE_TABLET | Freq: Every day | ORAL | Status: DC | PRN
  Administered 2014-11-06 – 2014-11-08 (×2): 10 mg via ORAL
  Filled 2014-11-06 (×2): qty 2

## 2014-11-06 MED ORDER — INSULIN ASPART 100 UNIT/ML ~~LOC~~ SOLN: 0.0000 [IU] | Freq: Three times a day (TID) | SUBCUTANEOUS | Status: DC

## 2014-11-06 MED ORDER — METOPROLOL TARTRATE 25 MG PO TABS
25.0000 mg | ORAL_TABLET | Freq: Two times a day (BID) | ORAL | Status: DC
  Administered 2014-11-06: 25 mg via ORAL
  Filled 2014-11-06 (×2): qty 1

## 2014-11-06 MED ORDER — POTASSIUM CHLORIDE 10 MEQ/50ML IV SOLN
10.0000 meq | INTRAVENOUS | Status: DC
  Administered 2014-11-06 (×2): 10 meq via INTRAVENOUS
  Filled 2014-11-06 (×2): qty 50

## 2014-11-06 MED ORDER — OXYCODONE HCL 5 MG PO TABS
5.0000 mg | ORAL_TABLET | ORAL | Status: DC | PRN
  Administered 2014-11-06 – 2014-11-12 (×22): 10 mg via ORAL
  Filled 2014-11-06: qty 2
  Filled 2014-11-06: qty 1
  Filled 2014-11-06 (×21): qty 2

## 2014-11-06 NOTE — Progress Notes (Signed)
Patient ID: Ashley Guerrero, female   DOB: 07/19/1965, 49 y.o.   MRN: [id number] TCTS DAILY ICU PROGRESS NOTE                   [address]            ,[place] [address]          [phone]   3 Days Post-Op Procedure(s) (LRB): REPLACEMENT ASCENDING AORTA with a 56mm hemashield graft,  resuspension of aortic valve, hypothermic circulatory arrest and cardiopulmonary bypass. (N/A)  Total Length of Stay:  LOS: 2 days   Subjective: Alert and awake, neuro intact ambulated  Objective: Vital signs in last 24 hours: Temp:  [97.6 F (36.4 C)-99.9 F (37.7 C)] 97.6 F (36.4 C) (05/03 0400) Pulse Rate:  [65-77] 70 (05/03 0600) Cardiac Rhythm:  [-] Normal sinus rhythm (05/03 0000) Resp:  [9-30] 15 (05/03 0600) BP: (121-183)/(65-86) 146/80 mmHg (05/03 0600) SpO2:  [92 %-97 %] 95 % (05/03 0600) Weight:  [227 lb 8.2 oz (103.2 kg)] 227 lb 8.2 oz (103.2 kg) (05/03 0400)  Filed Weights   11/04/14 0500 11/05/14 0500 11/06/14 0400  Weight: 230 lb 9.6 oz (104.6 kg) 228 lb 2.8 oz (103.5 kg) 227 lb 8.2 oz (103.2 kg)    Weight change: -10.6 oz (-0.3 kg)   Hemodynamic parameters for last 24 hours:    Intake/Output from previous day: 05/02 0701 - 05/03 0700 In: 1110 [P.O.:680; I.V.:280; IV Piggyback:150] Out: 2005 [Urine:1995; Chest Tube:10]  Intake/Output this shift:    Current Meds: Scheduled Meds: . sodium chloride   Intravenous Once  . acetaminophen  1,000 mg Oral 4 times per day   Or  . acetaminophen (TYLENOL) oral liquid 160 mg/5 mL  1,000 mg Per Tube 4 times per day  . acetaminophen (TYLENOL) oral liquid 160 mg/5 mL  650 mg Per Tube Once   Or  . acetaminophen  650 mg Rectal Once  . amLODipine  5 mg Oral Daily  . antiseptic oral rinse  7 mL Mouth Rinse BID  . aspirin EC  325 mg Oral Daily   Or  . aspirin  324 mg Per Tube Daily  . bisacodyl  10 mg Oral Daily   Or  . bisacodyl  10 mg Rectal Daily  . docusate sodium  200 mg Oral Daily  . guaiFENesin  600 mg  Oral BID  . insulin aspart  0-15 Units Subcutaneous 6 times per day  . insulin detemir  10 Units Subcutaneous Daily  . metoprolol tartrate  25 mg Oral BID   Or  . metoprolol tartrate  12.5 mg Per Tube BID  . pantoprazole  40 mg Oral Daily  . potassium chloride  10 mEq Intravenous Q1 Hr x 3  . sodium chloride  3 mL Intravenous Q12H   Continuous Infusions: . sodium chloride 10 mL/hr at 11/06/14 0600  . sodium chloride    . sodium chloride Stopped (11/05/14 1000)  . dexmedetomidine Stopped (11/04/14 1230)  . DOPamine Stopped (11/04/14 0100)  . lactated ringers Stopped (11/05/14 1300)  . lactated ringers Stopped (11/05/14 0800)  . nitroGLYCERIN Stopped (11/04/14 1400)  . phenylephrine (NEO-SYNEPHRINE) Adult infusion Stopped (11/04/14 0100)   PRN Meds:.sodium chloride, labetalol, metoprolol, morphine injection, ondansetron (ZOFRAN) IV, oxyCODONE, sodium chloride, traMADol  General appearance: alert, cooperative and no distress Neurologic: intact Heart: regular rate and rhythm, S1, S2 normal, no murmur, click, rub or gallop Lungs: diminished breath sounds bibasilar Abdomen: soft, non-tender; bowel sounds normal;  no masses,  no organomegaly Extremities: extremities normal, atraumatic, no cyanosis or edema and Homans sign is negative, no sign of DVT Wound: sternum stable  Lab Results: CBC: Recent Labs  11/05/14 0400 11/06/14 0500  WBC 14.1* 11.4*  HGB 10.3* 9.7*  HCT 30.6* 28.8*  PLT 170 153   BMET:  Recent Labs  11/05/14 0400 11/06/14 0500  NA 136 134*  K 3.6 3.5  CL 101 99*  CO2 26 28  GLUCOSE 123* 97  BUN 18 19  CREATININE 1.10* 0.89  CALCIUM 8.1* 8.3*    PT/INR:  Recent Labs  11/04/14 0133  LABPROT 16.9*  INR 1.36   Radiology: Dg Chest Port 1 View  11/05/2014   CLINICAL DATA:  History of aortic aneurysm and dissection. Left-sided chest pains, onset this morning.  EXAM: PORTABLE CHEST - 1 VIEW  COMPARISON:  11/04/2014  FINDINGS: There is a right jugular  sheath. The Swan-Ganz catheter has been removed. The endotracheal tube and nasogastric tube have been removed. There is a left-sided mediastinal drain.  There is no pneumothorax. There is mild left base atelectasis or infiltrate. The right lung is nearly completely clear with only minimal residual linear right upper lobe opacities, improved.  IMPRESSION: Support equipment appears satisfactorily positioned, with interval removal of the ET tube, NG tube, Swan-Ganz catheter and 1 of the mediastinal drains.  Continued improvement in the right upper lobe airspace opacity. Unchanged left base airspace opacity.   Electronically Signed   By: Andreas Newport M.D.   On: 11/05/2014 06:38     Assessment/Plan: S/P Procedure(s) (LRB): REPLACEMENT ASCENDING AORTA with a 18mm hemashield graft,  resuspension of aortic valve, hypothermic circulatory arrest and cardiopulmonary bypass. (N/A) Mobilize Plan for transfer to step-down: see transfer orders work on bp control  Renal function stable    Ashley Guerrero 11/06/2014 7:34 AM

## 2014-11-06 NOTE — Plan of Care (Signed)
Report to 2 W RN 

## 2014-11-06 NOTE — Progress Notes (Signed)
Patient Name: Ashley Guerrero Date of Encounter: 11/06/2014  Active Problems:   S/P ascending aortic replacement   Essential hypertension, malignant   Acute thoracic aortic dissection Type I   Large  Abdominal mass- question uterine origin    Length of Stay: 2  SUBJECTIVE  Incisional pain and nausea, but generally feeling OK  CURRENT MEDS . [START ON 11/07/2014] amLODipine  10 mg Oral Daily  . aspirin EC  325 mg Oral Daily  . docusate sodium  200 mg Oral Daily  . enoxaparin (LOVENOX) injection  30 mg Subcutaneous Q24H  . guaiFENesin  600 mg Oral BID  . insulin aspart  0-24 Units Subcutaneous TID AC & HS  . metoprolol tartrate  50 mg Oral BID  . pantoprazole  40 mg Oral QAC breakfast  . sodium chloride  3 mL Intravenous Q12H    OBJECTIVE   Intake/Output Summary (Last 24 hours) at 11/06/14 1411 Last data filed at 11/06/14 1400  Gross per 24 hour  Intake   1240 ml  Output    900 ml  Net    340 ml   Filed Weights   11/04/14 0500 11/05/14 0500 11/06/14 0400  Weight: 230 lb 9.6 oz (104.6 kg) 228 lb 2.8 oz (103.5 kg) 227 lb 8.2 oz (103.2 kg)    PHYSICAL EXAM Filed Vitals:   11/06/14 1320 11/06/14 1325 11/06/14 1336 11/06/14 1400  BP: 171/86 141/79 162/80 165/83  Pulse: 73 72 70 73  Temp:      TempSrc:      Resp: 19 17 21 21   Height:      Weight:      SpO2: 96% 98% 96% 95%   General: Alert, oriented x3, no distress Head: no evidence of trauma, PERRL, EOMI, no exophtalmos or lid lag, no myxedema, no xanthelasma; normal ears, nose and oropharynx Neck: normal jugular venous pulsations and no hepatojugular reflux; brisk carotid pulses without delay and no carotid bruits Chest: clear to auscultation, no signs of consolidation by percussion or palpation, normal fremitus, symmetrical and full respiratory excursions Cardiovascular: normal position and quality of the apical impulse, regular rhythm, normal first and second heart sounds, no rubs or gallops, no murmur Abdomen:  no tenderness or distention, no masses by palpation, no abnormal pulsatility or arterial bruits, normal bowel sounds, no hepatosplenomegaly Extremities: no clubbing, cyanosis or edema; 2+ radial, ulnar and brachial pulses bilaterally; 2+ right femoral, posterior tibial and dorsalis pedis pulses; 2+ left femoral, posterior tibial and dorsalis pedis pulses; no subclavian or femoral bruits Neurological: grossly nonfocal  LABS  CBC  Recent Labs  11/05/14 0400 11/06/14 0500  WBC 14.1* 11.4*  HGB 10.3* 9.7*  HCT 30.6* 28.8*  MCV 87.2 89.2  PLT 170 956   Basic Metabolic Panel  Recent Labs  11/04/14 0530 11/04/14 1708  11/05/14 0400 11/06/14 0500  NA 139  --   < > 136 134*  K 3.7  --   < > 3.6 3.5  CL 105  --   < > 101 99*  CO2 24  --   --  26 28  GLUCOSE 126*  --   < > 123* 97  BUN 16  --   < > 18 19  CREATININE 1.26* 1.23*  < > 1.10* 0.89  CALCIUM 7.6*  --   --  8.1* 8.3*  MG 2.9* 2.7*  --   --   --   < > = values in this interval not displayed. Liver Function Tests Radiology Studies  Imaging results have been reviewed and Dg Chest Port 1 View  11/06/2014   CLINICAL DATA:  Aortic dissection.  EXAM: PORTABLE CHEST - 1 VIEW  COMPARISON:  11/05/2014.  FINDINGS: Right IJ sheath in stable position. Prior CABG. Cardiomegaly. Progressive bilateral pulmonary alveolar infiltrates. Small left pleural effusion. These findings could be secondary congestive heart failure. Bilateral pneumonia cannot be excluded. Low lung volumes with basilar atelectasis. No pneumothorax.  IMPRESSION: 1. Right IJ sheath in stable position. 2. Prior CABG.  Cardiomegaly. 3. Interim development of bilateral pulmonary infiltrates and small left pleural effusion. These changes be secondary to congestive heart failure. Bilateral pneumonia cannot be excluded. 4. Low lung volumes with bibasilar subsegmental atelectasis.   Electronically Signed   By: Marcello Moores  Register   On: 11/06/2014 07:10   Dg Chest Port 1 View  11/05/2014    CLINICAL DATA:  History of aortic aneurysm and dissection. Left-sided chest pains, onset this morning.  EXAM: PORTABLE CHEST - 1 VIEW  COMPARISON:  11/04/2014  FINDINGS: There is a right jugular sheath. The Swan-Ganz catheter has been removed. The endotracheal tube and nasogastric tube have been removed. There is a left-sided mediastinal drain.  There is no pneumothorax. There is mild left base atelectasis or infiltrate. The right lung is nearly completely clear with only minimal residual linear right upper lobe opacities, improved.  IMPRESSION: Support equipment appears satisfactorily positioned, with interval removal of the ET tube, NG tube, Swan-Ganz catheter and 1 of the mediastinal drains.  Continued improvement in the right upper lobe airspace opacity. Unchanged left base airspace opacity.   Electronically Signed   By: Andreas Newport M.D.   On: 11/05/2014 06:38    TELE NSR  ECG NSR, LVH  ASSESSMENT AND PLAN  1. Essential hypertension 2. 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  Need target BP around 120s/70s, preferably using beta blockers. Increase metoprolol and amlodipine doses. PRN labetalol. Echo information may be useful, but unlikely to get good images with recent sternotomy.  Sanda Klein, MD, [facility] [phone] office [phone] pager 11/06/2014 2:11 PM

## 2014-11-06 NOTE — Plan of Care (Signed)
Attempted to call report to 2W 

## 2014-11-06 NOTE — Progress Notes (Signed)
Utilization Review Completed.  

## 2014-11-06 NOTE — Progress Notes (Signed)
Transferred to 2W 24 via WC, O2 and monitor. Placed in bed, SCD's with pt. RN in room to receive.

## 2014-11-07 ENCOUNTER — Inpatient Hospital Stay (HOSPITAL_COMMUNITY): Payer: Medicaid - Out of State

## 2014-11-07 LAB — TYPE AND SCREEN
ABO/RH(D): B POS
ANTIBODY SCREEN: NEGATIVE
UNIT DIVISION: 0
UNIT DIVISION: 0
UNIT DIVISION: 0
Unit division: 0
Unit division: 0
Unit division: 0

## 2014-11-07 LAB — CBC
HCT: 28.6 % — ABNORMAL LOW (ref 36.0–46.0)
Hemoglobin: 9.6 g/dL — ABNORMAL LOW (ref 12.0–15.0)
MCH: 29.4 pg (ref 26.0–34.0)
MCHC: 33.6 g/dL (ref 30.0–36.0)
MCV: 87.7 fL (ref 78.0–100.0)
Platelets: 196 10*3/uL (ref 150–400)
RBC: 3.26 MIL/uL — ABNORMAL LOW (ref 3.87–5.11)
RDW: 16.4 % — ABNORMAL HIGH (ref 11.5–15.5)
WBC: 10.7 10*3/uL — ABNORMAL HIGH (ref 4.0–10.5)

## 2014-11-07 LAB — BASIC METABOLIC PANEL
Anion gap: 12 (ref 5–15)
BUN: 15 mg/dL (ref 6–20)
CO2: 22 mmol/L (ref 22–32)
Calcium: 8.5 mg/dL — ABNORMAL LOW (ref 8.9–10.3)
Chloride: 97 mmol/L — ABNORMAL LOW (ref 101–111)
Creatinine, Ser: 0.8 mg/dL (ref 0.44–1.00)
GFR calc Af Amer: >60 mL/min (ref >60)
GFR calc non Af Amer: >60 mL/min (ref >60)
Glucose, Bld: 102 mg/dL — ABNORMAL HIGH (ref 70–99)
Potassium: 3.8 mmol/L (ref 3.5–5.1)
Sodium: 131 mmol/L — ABNORMAL LOW (ref 135–145)

## 2014-11-07 LAB — GLUCOSE, CAPILLARY
Glucose-Capillary: 109 mg/dL — ABNORMAL HIGH (ref 70–99)
Glucose-Capillary: 112 mg/dL — ABNORMAL HIGH (ref 70–99)
Glucose-Capillary: 85 mg/dL (ref 70–99)
Glucose-Capillary: 101 mg/dL — ABNORMAL HIGH (ref 70–99)

## 2014-11-07 MED ORDER — METOPROLOL TARTRATE 50 MG PO TABS
50.0000 mg | ORAL_TABLET | Freq: Three times a day (TID) | ORAL | Status: DC
  Administered 2014-11-07 – 2014-11-08 (×4): 50 mg via ORAL
  Filled 2014-11-07 (×6): qty 1

## 2014-11-07 MED ORDER — LACTULOSE 10 GM/15ML PO SOLN
20.0000 g | Freq: Once | ORAL | Status: AC
  Administered 2014-11-07: 20 g via ORAL
  Filled 2014-11-07: qty 30

## 2014-11-07 MED ORDER — LISINOPRIL 20 MG PO TABS
20.0000 mg | ORAL_TABLET | Freq: Every day | ORAL | Status: DC
  Administered 2014-11-07: 20 mg via ORAL
  Filled 2014-11-07 (×3): qty 1

## 2014-11-07 MED ORDER — POTASSIUM CHLORIDE CRYS ER 20 MEQ PO TBCR
20.0000 meq | EXTENDED_RELEASE_TABLET | Freq: Once | ORAL | Status: AC
  Administered 2014-11-07: 20 meq via ORAL
  Filled 2014-11-07: qty 1

## 2014-11-07 MED FILL — Potassium Chloride Inj 2 mEq/ML: INTRAVENOUS | Qty: 40 | Status: AC

## 2014-11-07 MED FILL — Dexmedetomidine HCl in NaCl 0.9% IV Soln 400 MCG/100ML: INTRAVENOUS | Qty: 100 | Status: AC

## 2014-11-07 MED FILL — Heparin Sodium (Porcine) Inj 1000 Unit/ML: INTRAMUSCULAR | Qty: 30 | Status: AC

## 2014-11-07 MED FILL — Magnesium Sulfate Inj 50%: INTRAMUSCULAR | Qty: 10 | Status: AC

## 2014-11-07 NOTE — Progress Notes (Addendum)
      [address]       [place] [address]             [phone]        4 Days Post-Op Procedure(s) (LRB): REPLACEMENT ASCENDING AORTA with a 32mm hemashield graft,  resuspension of aortic valve, hypothermic circulatory arrest and cardiopulmonary bypass. (N/A)  Subjective: Patient with constipation and incisional pain.  Objective: Vital signs in last 24 hours: Temp:  [97.6 F (36.4 C)-98.1 F (36.7 C)] 97.8 F (36.6 C) (05/04 0439) Pulse Rate:  [68-83] 75 (05/04 0439) Cardiac Rhythm:  [-] Normal sinus rhythm (05/03 2200) Resp:  [13-26] 20 (05/04 0439) BP: (131-189)/(52-89) 153/63 mmHg (05/04 0439) SpO2:  [91 %-98 %] 95 % (05/04 0439) Weight:  [228 lb 1.6 oz (103.465 kg)] 228 lb 1.6 oz (103.465 kg) (05/04 0439)  Pre op weight  116 kg Current Weight  11/07/14 228 lb 1.6 oz (103.465 kg)       Intake/Output from previous day: 05/03 0701 - 05/04 0700 In: 1000 [P.O.:900; IV Piggyback:100] Out: 650 [Urine:650]   Physical Exam:  Cardiovascular: RRR Pulmonary: Slightly diminished at bases; no rales, wheezes, or rhonchi. Abdomen: Soft, non tender, bowel sounds present. Extremities: Trace bilateral lower extremity edema. Wounds: Aquacel dressing is  intact.  Lab Results: CBC: Recent Labs  11/06/14 0500 11/07/14 0353  WBC 11.4* 10.7*  HGB 9.7* 9.6*  HCT 28.8* 28.6*  PLT 153 196   BMET:  Recent Labs  11/06/14 0500 11/07/14 0353  NA 134* 131*  K 3.5 3.8  CL 99* 97*  CO2 28 22  GLUCOSE 97 102*  BUN 19 15  CREATININE 0.89 0.80  CALCIUM 8.3* 8.5*    PT/INR:  Lab Results  Component Value Date   INR 1.36 11/04/2014   ABG:  INR: Will add last result for INR, ABG once components are confirmed Will add last 4 CBG results once components are confirmed  Assessment/Plan:  1. CV - Hypertensive with SBP in the 140-150's. On Norvasc 10 mg daily, Lopressor 50 mg bid. Will add Lisinopril for better BP control. 2.  Pulmonary - On 1 liter of  oxygen via [place]. Will wean as tolerates. CXR LLL atelectasis, small bilateral pleural effusions.Encourage incentive spirometer 3.  Acute blood loss anemia - H and H stable at 9.6 and 28.6 4. Supplement potassium 5. LOC constipation 6. Remove EPW 7. CBGs 116/110/90. No prior history of diabetes. HGA1C not drawn as was an emergency. Stop accu checks and SS PRN  ZIMMERMAN,DONIELLE MPA-C 11/07/2014,8:16 AM  I have seen and examined [facility] and agree with the above assessment  and plan.  [name] MD [facility] [phone] Office [phone] 11/07/2014 3:55 PM

## 2014-11-07 NOTE — Discharge Summary (Signed)
Physician Discharge Summary       [address]       [place] [address]             [phone]    Patient ID: Ashley Guerrero MRN: [id number] DOB/AGE: [birth year] 49 y.o.  Admit date: 11/03/2014 Discharge date: 11/11/2014  Admission Diagnoses: 1. Type I ascending thoracic aortic dissection 2. History of hypertension 3. History of tobacco abuse 4. Large abdominal mass-likely fibroid  Discharge Diagnoses:  1. Type I ascending thoracic aortic dissection 2. History of hypertension 3. History of tobacco abuse 4. Large abdominal mass-likely fibroid 5. ABL anemia  Consults: cardiology and vascular surgery  Procedure (s):  REPLACEMENT ASCENDING AORTA with a 27mm hemashield graft, resuspension of aortic valve, hypothermic circulatory arrest and cardiopulmonary bypass by Dr. Servando Snare on 11/03/2014.  History of Presenting Illness: This is a 49 year old hypertensive female, who approximate 6 months ago stopped taking antihypertensive medication. She awoke this morning with anterior chest pain dizziness and lightheadedness the discomfort quickly moved to her back and into her abdomen. She noted nausea and came to the emergency room at 12:30 PM today. A CT scan of the abdomen was performed and demonstrated abdominal aortic dissection and a large intra-abdominal pelvic mass. The patient was then returned to the scanner and just approximately 20 minutes ago CTA scan of the chest was completed demonstrating type I aortic dissection starting just above the sinotubular ridge. Patient has a normal takeoff of the vessels of the arch.  In the emergency room on exam, the patient is awake alert and conversant. She denies any lower extremity pain or weakness. The patient has a Type I aortic dissection starting just above the aortic valve extending through the arch and descending aorta into the left common iliac artery, with numerous fenestrations and equal perfusion of both kidneys. No  aortic insufficiency on exam. She also has a history of poorly controlled hypertension.  I have discussed with the patient the diagnosis of acute ascending aortic dissection and the need for repair urgently and possibly replacement of aortic valve. She is aware that lifelong Coumadin will be required if a mechanical valve is used. Risks and options are discussed and patient agreed to proceed with surgery.  Brief Hospital Course:  The patient was extubated the afternoon of post operative day one without difficulty. She remained afebrile and hemodynamically stable. She was weaned off of Nitro, Dopamine, and Neo Synephrine drips. Gordy Councilman, a line, chest tubes, and foley were removed early in the post operative course. Lopressor was started and titrated accordingly. She was volume over loaded and diuresed. She had ABL anemia. She did not require a post op transfusion. Her last H and H was stable at 9.6 and 28.6. She was weaned off the insulin drip. The patient's glucose remained well controlled.  The patient was felt surgically stable for transfer from the ICU to [facility] for further convalescence on 11/06/2014. She continued to be hypertensive and medications were adjusted accordingly. BP is better controlled with Coreg, Norvasc, and Lisinopril. She continues to progress with cardiac rehab. She was requiring 1 liter of oxygen via [place]. She was later weaned ambulating on room air. She has been tolerating a diet and has had a bowel movement. She had some sero sanguinous drainage from her lower sternal wound. There was no erythema but she did have a fever to 100.7. This was likely secondary to fat necrosis. She was put on Keflex. She became afebrile. She still had some sero  sanguinous drainage from the lower sternal wound. Epicardial pacing wires and chest tube sutures were removed prior to discharge. The patient is felt surgically stable for discharge today.  Latest Vital Signs: Blood pressure 132/72, pulse 65,  temperature 98.6 F (37 C), temperature source Oral, resp. rate 16, height 5' (1.524 m), weight 226 lb 8 oz (102.74 kg), last menstrual period 10/29/2014, SpO2 98 %.  Physical Exam: Cardiovascular: RRR Pulmonary: Slightly diminished at bases; no rales, wheezes, or rhonchi. Abdomen: Soft, non tender, bowel sounds present. Extremities: Trace bilateral lower extremity edema. Wounds: Aquacel dressing is intact.  Discharge Condition:Stable and discharged to home  Recent laboratory studies:  Lab Results  Component Value Date   WBC 10.7* 11/07/2014   HGB 9.6* 11/07/2014   HCT 28.6* 11/07/2014   MCV 87.7 11/07/2014   PLT 196 11/07/2014   Lab Results  Component Value Date   NA 131* 11/07/2014   K 3.8 11/07/2014   CL 97* 11/07/2014   CO2 22 11/07/2014   CREATININE 0.80 11/07/2014   GLUCOSE 102* 11/07/2014      Diagnostic Studies: Dg Chest 2 View  11/07/2014   CLINICAL DATA:  Status post ascending aortic replacement for dissection, known of abdominal mass.  EXAM: CHEST  2 VIEW  COMPARISON:  Portable chest x-ray of Nov 06, 2014  FINDINGS: The lungs are reasonably well inflated. There is persistent atelectasis or infiltrate in the left lower lobe. There are small bilateral pleural effusions layering posteriorly. The cardiac silhouette remains mildly enlarged. The pulmonary vascularity is normal. There is mild mediastinal prominence which has improved. There are 7 intact sternal wires. The right internal jugular Cordis sheath has been removed. There is multilevel degenerative disc space narrowing.  IMPRESSION: Persistent left lower lobe atelectasis and small bilateral pleural effusions. Overall improvement in the appearance of the chest has occurred since the previous study.   Electronically Signed   By: David  Martinique M.D.   On: 11/07/2014 08:07   Ct Abdomen Pelvis W Contrast  11/03/2014   CLINICAL DATA:  Abdominal pain.  EXAM: CT ABDOMEN AND PELVIS WITH CONTRAST  TECHNIQUE: Multidetector CT  imaging of the abdomen and pelvis was performed using the standard protocol following bolus administration of intravenous contrast.  CONTRAST:  135mL OMNIPAQUE IOHEXOL 300 MG/ML  SOLN  COMPARISON:  None.  FINDINGS: Lower chest: The lung bases are unremarkable.  Heart size is normal.  Upper abdomen: No focal abnormality identified within the liver, spleen, pancreas, or adrenal glands. There are extrarenal pelves bilaterally. There is slight delay in enhancement of the right kidney, possibly related to delayed arterial inflow. See below.  Gastrointestinal tract: The stomach and small bowel loops are nondilated the compressed primarily within the right upper quadrant. Colonic loops are normal in appearance. The appendix is not well seen but may be obscured or absent.  Pelvis: Extending from the pelvis into the abdomen there is a large heterogeneous solid mass extending out superior portion of the uterus and measuring at least 27 x 27 x 13.4 cm. The urinary bladder is decompressed but has a thickened wall. No definite ovarian mass identified. There is no free pelvic fluid.  Retroperitoneum: There is an abdominal aortic dissection which begins at the upper most cuts of the study and extends into the left iliac artery. Dissection flap extends into the celiac axis and superior mesenteric artery. Is difficult to assess the integrity of the renal arteries given the contrast bolus which was performed for general abdominal imaging. Contrast is identified  within the inferior mesenteric artery.  Abdominal wall: Unremarkable.  Osseous structures: There are moderate degenerative changes in the lower thoracic and lower lumbar spine. No suspicious lytic or blastic lesions are identified.  IMPRESSION: 1. Abdominal aortic dissection extending into the celiac axis, superior mesenteric artery, and left iliac artery. Because of the timing of the contrast bolus, is difficult to evaluate integrity of the abdominal vessels. 2. Consider CT  angiography of the chest and abdomen for further characterization. 3. There is probable delayed nephrogram right kidney, raising suspicion for vascular compromise right renal artery. 4. Large pelvic mass extending from the fundus of the uterus consistent with large uterine fibroids, measuring at least 27 cm. 5. Critical Value/emergent results were called by telephone at the time of interpretation on 11/03/2014 at 2:56 pm to Dr. Davonna Belling , who verbally acknowledged these results.   Electronically Signed   By: Nolon Nations M.D.   On: 11/03/2014 15:01    Ct Angio Chest Aorta W/cm &/or Wo/cm  11/03/2014   CLINICAL DATA:  49 year old female presents with acute abdominal pain after a meal with nausea and vomiting.  EXAM: CT ANGIOGRAPHY CHEST, ABDOMEN AND PELVIS  TECHNIQUE: Multidetector CT imaging through the chest, abdomen and pelvis was performed using the standard protocol during bolus administration of intravenous contrast. Multiplanar reconstructed images and MIPs were obtained and reviewed to evaluate the vascular anatomy.  CONTRAST:  188mL OMNIPAQUE IOHEXOL 350 MG/ML SOLN  COMPARISON:  Abdominal CT 11/03/2014, performed same day.  FINDINGS: Nonvascular chest:  Unremarkable chest wall.  No mediastinal adenopathy.  Heart size within normal limits. No pericardial fluid. Small hiatal hernia.  Minimal atelectasis bilateral lung bases. No pleural effusion or pneumothorax. No confluent airspace disease.  CTA CHEST FINDINGS  Aorta:  Dissection flap involving the ascending and descending aorta.  The origination of the dissection flap appears to be the sino-tubular junction. The origin of the left coronary artery and right coronary artery are patent. Dissection flap extends through the aortic arch, involving the origin of the branch vessels.  Irregular soft tissue at the origin of the innominate artery which remains patent. Tortuosity of the innominate artery with the right common carotid artery and subclavian  artery patent.  Dissection flap extends across the origin of the left common carotid artery and the origin of the left subclavian artery.  The dissection flap does not appear to extend into the left common carotid artery, though does extend into the origin of the left subclavian artery approximately 2-3 cm.  Separate origin of the left vertebral artery which remains patent.  Dissection flap extends through the length of descending thoracic aorta, with both the false lumen and true lumen opacified.  Diameter of the ascending aorta measures 4.6 cm. Diameter of the descending thoracic aorta in the mid segment measures 3 cm. Diameter at the aortic hiatus 2.5 cm.  No filling defects identified within the pulmonary arterial tree.  The distal pulmonary arteries are larger than the accompanying bronchi, and extend towards the peripheral 1/3 of the lungs.  Review of the MIP images confirms the above findings.  Nonvascular abdomen/pelvis:  Unremarkable liver and spleen.  Unremarkable adrenal glands.  Unremarkable pancreas and gallbladder.  No left or right-sided hydronephrosis.  No nephrolithiasis.  Soft tissue mass with a significant internal vasculature and multiple lobulated components within the right abdomen and pelvis. The mass displaces small bowel and colon. Greatest diameter 25.3 cm x 12.2 cm. The mass is continuous with the superior aspect of the uterus.  And the bilateral uterine arteries are hypertrophied and contributing to the body of this soft tissue mass. Right-sided ovarian artery is hypertrophy contributing to soft tissue mass of the abdomen.  No evidence of abnormally distended small bowel or colon.  Urinary bladder partially distended with the urine/excreted contrast.  CTA ABDOMEN AND PELVIS FINDINGS  Aorta and mesenteric vessels:  Dissection flap extends through the abdominal aorta, and appears to terminate in the proximal left common iliac artery at the origin of the hypogastric artery.  Greatest diameter  of the infrarenal abdominal aorta measures approximately 23 mm.  Dissection flap enters the origin of the celiac artery, which remains patent.  Dissection flap enters the origin of the superior mesenteric artery extending approximately 8-10 cm into the origin. Distal superior mesenteric artery and its branches are patent.  The inferior mesenteric artery remains patent.  Bilateral single renal arteries remain patent.  Right lower extremity:  Dissection flap terminates at the origin the right common iliac artery. Greatest diameter of the right common iliac artery measures 1.4 cm. Tortuosity of the iliac system. Right hypogastric artery remains patent. No aneurysmal dilation of the external iliac artery. Right common femoral artery remains patent.  Left lower extremity:  Dissection flap extends into the left common iliac artery, terminating at the hypogastric origin. Greatest diameter of the left common iliac artery measures 1.4 cm.  Hypogastric artery remains patent. Tortuosity of the left iliac system. No aneurysm the left external iliac artery. No significant atherosclerotic disease. Left common femoral artery remains patent.  Review of the MIP images confirms the above findings.  IMPRESSION: Type A aortic dissection, with the following features:  A) origination at the sino-tubular junction with patency of left and right coronary arteries.  B) associated aneurysm of the ascending aorta measuring 4.6 cm  C) dissection flap extending across the origin of the branch vessels, which are filling and remain patent. The dissection flap extends into left subclavian artery approximately 3 cm. There is redundant soft tissue at the origin of the innominate artery, suggesting that the dissection flap tissue has become redundant at the origin.  D) separate origin of the left vertebral artery from the aortic arch. Vertebral artery remains patent.  E) all of the mesenteric vessels remain patent, though the dissection flap crosses  the origin of the celiac artery, superior mesenteric artery, and bilateral renal arteries.  F) dissection flap extends at least 10 cm into the origin of the superior mesenteric artery which remains patent.  G) flap terminates within the left iliac artery at the origin of the hypogastric artery.  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  Evidence of pulmonary artery hypertension as the pulmonary arteries extend to the distal 1/3 of the lungs, and are larger than the accompanying bronchi.  Large right-sided abdominal/pelvic soft tissue mass displacing small bowel, continuous with the dome of the uterus and favored to be uterine origin given the hypertrophied, contributing bilateral uterine arteries and right ovarian artery.  Preliminary results were called by telephone at the time of interpretation on 11/03/2014 at 4:52 pm to Dr. Davonna Belling , who verbally acknowledged these results.  Signed,  Dulcy Fanny. Earleen Newport, DO  [facility]  [facility]   Electronically Signed   By: Corrie Mckusick D.O.   On: 11/03/2014 17:27   Ct Angio Abd/pel W/ And/or W/o  11/03/2014   CLINICAL DATA:  49 year old female presents with acute abdominal pain after a meal with  nausea and vomiting.  EXAM: CT ANGIOGRAPHY CHEST, ABDOMEN AND PELVIS  TECHNIQUE: Multidetector CT imaging through the chest, abdomen and pelvis was performed using the standard protocol during bolus administration of intravenous contrast. Multiplanar reconstructed images and MIPs were obtained and reviewed to evaluate the vascular anatomy.  CONTRAST:  165mL OMNIPAQUE IOHEXOL 350 MG/ML SOLN  COMPARISON:  Abdominal CT 11/03/2014, performed same day.  FINDINGS: Nonvascular chest:  Unremarkable chest wall.  No mediastinal adenopathy.  Heart size within normal limits. No pericardial fluid. Small hiatal hernia.  Minimal atelectasis bilateral lung bases. No pleural effusion or pneumothorax.  No confluent airspace disease.  CTA CHEST FINDINGS  Aorta:  Dissection flap involving the ascending and descending aorta.  The origination of the dissection flap appears to be the sino-tubular junction. The origin of the left coronary artery and right coronary artery are patent. Dissection flap extends through the aortic arch, involving the origin of the branch vessels.  Irregular soft tissue at the origin of the innominate artery which remains patent. Tortuosity of the innominate artery with the right common carotid artery and subclavian artery patent.  Dissection flap extends across the origin of the left common carotid artery and the origin of the left subclavian artery.  The dissection flap does not appear to extend into the left common carotid artery, though does extend into the origin of the left subclavian artery approximately 2-3 cm.  Separate origin of the left vertebral artery which remains patent.  Dissection flap extends through the length of descending thoracic aorta, with both the false lumen and true lumen opacified.  Diameter of the ascending aorta measures 4.6 cm. Diameter of the descending thoracic aorta in the mid segment measures 3 cm. Diameter at the aortic hiatus 2.5 cm.  No filling defects identified within the pulmonary arterial tree.  The distal pulmonary arteries are larger than the accompanying bronchi, and extend towards the peripheral 1/3 of the lungs.  Review of the MIP images confirms the above findings.  Nonvascular abdomen/pelvis:  Unremarkable liver and spleen.  Unremarkable adrenal glands.  Unremarkable pancreas and gallbladder.  No left or right-sided hydronephrosis.  No nephrolithiasis.  Soft tissue mass with a significant internal vasculature and multiple lobulated components within the right abdomen and pelvis. The mass displaces small bowel and colon. Greatest diameter 25.3 cm x 12.2 cm. The mass is continuous with the superior aspect of the uterus. And the bilateral uterine  arteries are hypertrophied and contributing to the body of this soft tissue mass. Right-sided ovarian artery is hypertrophy contributing to soft tissue mass of the abdomen.  No evidence of abnormally distended small bowel or colon.  Urinary bladder partially distended with the urine/excreted contrast.  CTA ABDOMEN AND PELVIS FINDINGS  Aorta and mesenteric vessels:  Dissection flap extends through the abdominal aorta, and appears to terminate in the proximal left common iliac artery at the origin of the hypogastric artery.  Greatest diameter of the infrarenal abdominal aorta measures approximately 23 mm.  Dissection flap enters the origin of the celiac artery, which remains patent.  Dissection flap enters the origin of the superior mesenteric artery extending approximately 8-10 cm into the origin. Distal superior mesenteric artery and its branches are patent.  The inferior mesenteric artery remains patent.  Bilateral single renal arteries remain patent.  Right lower extremity:  Dissection flap terminates at the origin the right common iliac artery. Greatest diameter of the right common iliac artery measures 1.4 cm. Tortuosity of the iliac system. Right hypogastric artery remains patent.  No aneurysmal dilation of the external iliac artery. Right common femoral artery remains patent.  Left lower extremity:  Dissection flap extends into the left common iliac artery, terminating at the hypogastric origin. Greatest diameter of the left common iliac artery measures 1.4 cm.  Hypogastric artery remains patent. Tortuosity of the left iliac system. No aneurysm the left external iliac artery. No significant atherosclerotic disease. Left common femoral artery remains patent.  Review of the MIP images confirms the above findings.  IMPRESSION: Type A aortic dissection, with the following features:  A) origination at the sino-tubular junction with patency of left and right coronary arteries.  B) associated aneurysm of the ascending  aorta measuring 4.6 cm  C) dissection flap extending across the origin of the branch vessels, which are filling and remain patent. The dissection flap extends into left subclavian artery approximately 3 cm. There is redundant soft tissue at the origin of the innominate artery, suggesting that the dissection flap tissue has become redundant at the origin.  D) separate origin of the left vertebral artery from the aortic arch. Vertebral artery remains patent.  E) all of the mesenteric vessels remain patent, though the dissection flap crosses the origin of the celiac artery, superior mesenteric artery, and bilateral renal arteries.  F) dissection flap extends at least 10 cm into the origin of the superior mesenteric artery which remains patent.  G) flap terminates within the left iliac artery at the origin of the hypogastric artery.  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  Evidence of pulmonary artery hypertension as the pulmonary arteries extend to the distal 1/3 of the lungs, and are larger than the accompanying bronchi.  Large right-sided abdominal/pelvic soft tissue mass displacing small bowel, continuous with the dome of the uterus and favored to be uterine origin given the hypertrophied, contributing bilateral uterine arteries and right ovarian artery.  Preliminary results were called by telephone at the time of interpretation on 11/03/2014 at 4:52 pm to Dr. Davonna Belling , who verbally acknowledged these results.  Signed,  Dulcy Fanny. Earleen Newport, DO  [facility]  [facility]   Electronically Signed   By: Corrie Mckusick D.O.   On: 11/03/2014 17:27   Discharge Instructions    Amb Referral to Cardiac Rehabilitation    Complete by:  As directed   Congestive Heart Failure: If diagnosis is Heart Failure, patient MUST meet each of the CMS criteria: 1. Left Ventricular Ejection Fraction </= 35% 2. NYHA class II-IV symptoms  despite being on optimal heart failure therapy for at least 6 weeks. 3. Stable = have not had a recent (<6 weeks) or planned (<6 months) major cardiovascular hospitalization or procedure  Program Details: - Physician supervised classes - 1-3 classes per week over a 12-18 week period, generally for a total of 36 sessions  Physician Certification: I certify that the above Cardiac Rehabilitation treatment is medically necessary and is medically approved by me for treatment of this patient. The patient is willing and cooperative, able to ambulate and medically stable to participate in exercise rehabilitation. The participant's progress and Individualized Treatment Plan will be reviewed by the Medical Director, Cardiac Rehab staff and as indicated by the Referring/Ordering Physician.  Diagnosis:  Other           Discharge Medications:   Medication List    STOP taking these medications        acetaminophen 325 MG tablet  Commonly known as:  TYLENOL  TAKE these medications        amLODipine 10 MG tablet  Commonly known as:  NORVASC  Take 1 tablet (10 mg total) by mouth daily.     aspirin 325 MG EC tablet  Take 1 tablet (325 mg total) by mouth daily.     carvedilol 25 MG tablet  Commonly known as:  COREG  Take 1 tablet (25 mg total) by mouth 2 (two) times daily with a meal.     cephALEXin 500 MG capsule  Commonly known as:  KEFLEX  Take 1 capsule (500 mg total) by mouth every 8 (eight) hours.     lisinopril 40 MG tablet  Commonly known as:  PRINIVIL,ZESTRIL  Take 1 tablet (40 mg total) by mouth daily.     oxyCODONE 5 MG immediate release tablet  Commonly known as:  Oxy IR/ROXICODONE  Take 1-2 tablets (5-10 mg total) by mouth every 4 (four) hours as needed for severe pain.       The patient has been discharged on:   1.Beta Blocker:  Yes [ x  ]                              No   [   ]                              If No, reason:  2.Ace Inhibitor/ARB: Yes [ x  ]                                      No  [    ]                                     If No, reason:  3.Statin:   Yes [   ]                  No  [ x  ]                  If No, reason:No CAD  4.[place]:  Yes  [  x ]                  No   [   ]                  If No, reason:  Follow Up Appointments: Follow-up Information    Follow up with [name], MD On 12/10/2014.   Specialty:  Cardiothoracic Surgery   Why:  PA/LAT CXR to be taken (at [place] which is in the same building as Dr. Everrett Coombe office) on 12/10/2014 at 12:00 pm ;Appointment time is at 1:00 pm with physician assistant   Contact information:   [address] [address] [place] [address] [phone]       Follow up with Richardson Dopp, PA-C On 12/06/2014.   Specialties:  Physician Assistant, Radiology, Interventional Cardiology   Why:  Appointment time is at 8:50 am   Contact information:   [address] [place] [address] [phone]       Signed: Lars Pinks MPA-C 11/11/2014, 11:02 AM

## 2014-11-07 NOTE — Progress Notes (Signed)
Subjective:  Up in chair, still weak post op  Objective:  Vital Signs in the last 24 hours: Temp:  [97.6 F (36.4 C)-98.1 F (36.7 C)] 97.8 F (36.6 C) (05/04 0439) Pulse Rate:  [68-83] 75 (05/04 0439) Resp:  [13-26] 20 (05/04 0439) BP: (131-189)/(52-89) 153/63 mmHg (05/04 0439) SpO2:  [91 %-98 %] 95 % (05/04 0439) Weight:  [228 lb 1.6 oz (103.465 kg)] 228 lb 1.6 oz (103.465 kg) (05/04 0439)  Intake/Output from previous day:  Intake/Output Summary (Last 24 hours) at 11/07/14 0827 Last data filed at 11/07/14 0700  Gross per 24 hour  Intake    950 ml  Output    650 ml  Net    300 ml    Physical Exam: General appearance: alert, cooperative, no distress and mildly obese Lungs: decreased at bases Heart: regular rate and rhythm Extremities: no edema   Rate: 70  Rhythm: normal sinus rhythm  Lab Results:  Recent Labs  11/06/14 0500 11/07/14 0353  WBC 11.4* 10.7*  HGB 9.7* 9.6*  PLT 153 196    Recent Labs  11/06/14 0500 11/07/14 0353  NA 134* 131*  K 3.5 3.8  CL 99* 97*  CO2 28 22  GLUCOSE 97 102*  BUN 19 15  CREATININE 0.89 0.80   No results for input(s): TROPONINI in the last 72 hours.  Invalid input(s): CK, MB No results for input(s): INR in the last 72 hours.  Scheduled Meds: . amLODipine  10 mg Oral Daily  . aspirin EC  325 mg Oral Daily  . docusate sodium  200 mg Oral Daily  . enoxaparin (LOVENOX) injection  30 mg Subcutaneous Q24H  . guaiFENesin  600 mg Oral BID  . insulin aspart  0-24 Units Subcutaneous TID AC & HS  . lisinopril  20 mg Oral Daily  . metoprolol tartrate  50 mg Oral BID  . pantoprazole  40 mg Oral QAC breakfast  . potassium chloride  20 mEq Oral Once  . sodium chloride  3 mL Intravenous Q12H   Continuous Infusions:  PRN Meds:.sodium chloride, bisacodyl **OR** bisacodyl, labetalol, magnesium hydroxide, ondansetron **OR** ondansetron (ZOFRAN) IV, oxyCODONE, sodium chloride, traMADol   Imaging: Dg Chest 2  View  11/07/2014   CLINICAL DATA:  Status post ascending aortic replacement for dissection, known of abdominal mass.  EXAM: CHEST  2 VIEW  COMPARISON:  Portable chest x-ray of Nov 06, 2014  FINDINGS: The lungs are reasonably well inflated. There is persistent atelectasis or infiltrate in the left lower lobe. There are small bilateral pleural effusions layering posteriorly. The cardiac silhouette remains mildly enlarged. The pulmonary vascularity is normal. There is mild mediastinal prominence which has improved. There are 7 intact sternal wires. The right internal jugular Cordis sheath has been removed. There is multilevel degenerative disc space narrowing.  IMPRESSION: Persistent left lower lobe atelectasis and small bilateral pleural effusions. Overall improvement in the appearance of the chest has occurred since the previous study.   Electronically Signed   By: David  Martinique M.D.   On: 11/07/2014 08:07    Cardiac Studies:  Assessment/Plan:  49 year old African-American woman who currently lives in [place] and who visits her elderly father monthly here in [place].She has a history of HTN but had stopped her medications after she changed her diet and her B/P improved. She had dizziness at home and a neighbor (an Therapist, sports) checked her B/P- ">200". Admitted 11/03/14 with Type 1 aortic dissection. She had ascending aorta replacement 11/03/14.  Cardiology asked to see post op for help with B/P control.   Principal Problem:   Acute thoracic aortic dissection Type I Active Problems:   S/P ascending aortic replacement   Essential hypertension, malignant   Large  Abdominal mass- question uterine origin    PLAN: B/P still high but she hasn't received this am's meds yet.   Kerin Ransom PA-C 11/07/2014, 8:27 AM [phone]  I have seen and examined the patient along with Kerin Ransom PA-C.  I have reviewed the chart, notes and new data.  I agree with PA's note.  Key new complaints: weakness and  incisional pain Key examination changes: no arrhythmia or overt CHF, BP improving, still well above goal Key new findings / data: normal renal function  PLAN: Increase metoprolol to TID. If BP still high this PM give another dose of lisinopril 20 mg this PM  Sanda Klein, MD, [place] [phone] 11/07/2014, 8:43 AM

## 2014-11-07 NOTE — Op Note (Signed)
NAMECRESSIDA, MILFORD NO.:  [id number]  MEDICAL RECORD NO.:  [id number]  LOCATION:  [id number]                        FACILITY:  [facility]  PHYSICIAN:  Ala Dach, M.D.DATE OF BIRTH:  21-May-1966  DATE OF PROCEDURE:  11/03/2014 DATE OF DISCHARGE:                              OPERATIVE REPORT   Transesophageal Echocardiographic Report.  INDICATION FOR PROCEDURE:  Ms. Gomm is a 49 year old African American woman who presents in the evening of surgery with substernal and back pain and is felt to have an aortic dissection by CT scan.  She was brought urgently to the OR for placement of lines and induction of anesthesia for emergency surgery and repair of a thoracic aortic dissection.  After induction of anesthesia, the trachea was intubated, the TEE probe was prepared and passed oropharyngeally into the stomach and slightly withdrawn for imaging of the cardiac structures.  EXAMINATION:  Left ventricle:  The left ventricular chamber is seen initially in the short-axis view.  There is significant concentric left ventricular hypertrophy appreciated.  All segmental walls are thickening and contractile.  Papillary muscles are well outlined.  Both short and long axis views are obtained.  Overall, contractility is good, excellent.  Mitral valve:  The mitral valve leaflets themselves are seen initially in the four-chamber view.  They are thin, compliant and mobile.  No abnormalities are appreciated.  They have normal opening and appropriate closure during systolic ejection.  Left atrium:  Normal left atrial chamber.  The interatrial septum is interrogated and is intact.  Aortic valve:  Aortic valve is seen initially in the short-axis view. We see what appears to be essentially a normal aortic root cusp area with the three leaflets, i.e. it is trileaflet.  These leaflets are thin, these appear to open satisfactorily during systolic ejection and closed appropriately.   On long-axis view, we then see that with this color Doppler, there is only trivial regurgitant flow across the aortic valve.  Further, as we pulled up into the ascending aorta, we are now able to see at the level of the sino-tubular junction:  a large, adjacent to the wall just above the ST junction,a  mobile section of a flap that is the ascending aortic dissection.  We could see that this extends well into the ascending aorta.  It extends as far as I am able to pull the probe back into the ascending aorta.  On rotation to view the descending arch and posterior aorta, we again are able to detect that the flap is present there, so this is a flap in aortic dissection that extends away from the sino- tubular junction into the ascending aorta through the arch and into the descending aorta.  Attention was then further taken more closely to the aortic valve.  The aortic valve area itself appears normal in its size, shape and function. No other abnormalities are appreciated.  We placed color Doppler in the area of the flap.  We could not make out any jets to cross the flap area itself, but we were able to visualize the true and false lumen areas.  Right ventricle:  There is normal right ventricle chamber.  Tricuspid valve:  Normal functioning tricuspid valve.  Right atrium:  Normal right atrial chamber in size and function. Pulmonary artery catheter is noted within.  The patient was placed on cardiopulmonary bypass.  Hypothermia was instituted with ultimately circ arrest.  Ultimately, the patient has rewarmed and separated from cardiopulmonary bypass with the initial attempt.  Attention was then taken immediately to the area of the aortic valve itself.  The native aortic valve is in place.  It is essentially unchanged from the prebypass.  Now, the descending aortic area is the area of the ascending graft that is seen and visualized, all appears normal in its views in both short and long  axis views.  There are no areas of stenosis or any regurgitant flows, this appears to be a satisfactory repair.  The rest of the cardiac structures were as previously described.  The patient was returned to the cardiac intensive care unit in stable condition.          ______________________________ Ala Dach, M.D.     JTM/MEDQ  D:  11/06/2014  T:  11/07/2014  Job:  [id number]

## 2014-11-07 NOTE — Progress Notes (Signed)
CARDIAC REHAB PHASE I   PRE:  Rate/Rhythm: 75 SR  BP:  Supine:   Sitting: 158/76  Standing:    SaO2: 97% 1L  MODE:  Ambulation: 275 ft   POST:  Rate/Rhythm: 86 SR  BP:  Supine:   Sitting: 160/70  Standing:    SaO2: 93%RA 1128-1153 Pt walked 275 ft on RA with rolling walker and asst x 1 with slow steady gait. C/o feeling a little dizzy but did not need to sit. Needed to go to bathroom so cut walk short. Left on RA.  To recliner after walk.    Graylon Good, RN BSN  11/07/2014 11:49 AM

## 2014-11-07 NOTE — Discharge Instructions (Signed)
Activity: 1.May walk up steps                2.No lifting more than ten pounds for four weeks.                 3.No driving for four weeks.                4.Stop any activity that causes chest pain, shortness of breath, dizziness,  sweating or excessive weakness.                5.Avoid straining.                6.Continue with your breathing exercises daily.  Diet: Low fat, Low salt diet  Wound Care: May shower.  Clean wounds with mild soap and water daily. Contact the office at [phone] if any problems arise.    Thoracic Aortic Aneurysm An aneurysm is a bulge in an artery. It happens when the wall of the artery is weakened or damaged. If the aneurysm gets too big, it bursts (ruptures) and severe bleeding occurs. A thoracic aortic aneurysm is an aneurysm that occurs in the first part of the aorta, between the heart and the diaphragm. The aorta is the main artery and supplies blood from the heart to the rest of the body. A thoracic aortic aneurysm can enlarge and rupture or blood can flow between the layers of the wall of the aorta through a tear (aorticdissection). Both of these conditions can cause bleeding inside the body and can be life threatening unless diagnosed and treated promptly. CAUSES  The exact cause of a thoracic aortic aneurysm is often unknown. Some contributing factors are:   A hardening of the arteries caused by the buildup of fat and other substances in the lining of a blood vessel (arteriosclerosis).  Inflammation of the walls of an artery (arteritis).  Connective tissue diseases, such as Marfan syndrome.  Injury or trauma to the aorta.  An infection, such as syphilis or staphylococcus, in the wall of the aorta (infectious aortitis) caused by bacteria. RISK FACTORS  Risk factors that contribute to a thoracic aortic aneurysm may include:  Age older than 74 years.  High blood pressure (hypertension).  Female gender.  Ethnicity (white  race).  Obesity.  Family history of aneurysm (first degree relatives only).  Tobacco use. PREVENTION  The following healthy lifestyle habits may help decrease your risk of a thoracic aortic aneurysm:  Quitting smoking. Smoking can raise your blood pressure and cause arteriosclerosis.  Limiting or avoiding alcohol.  Keeping your blood pressure, blood sugar level, and cholesterol levels within normal limits.  Decreasing your salt intake. In some people, too much salt can raise blood pressure and increase your risk of abdominal aortic aneurysm.  Eating a diet low in saturated fats and cholesterol.  Increasing your fiber intake by including whole grains, vegetables, and fruits in your diet. Eating these foods may help lower blood pressure.  Maintaining a healthy weight.  Staying physically active and exercising regularly. SYMPTOMS  The symptoms of thoracic aortic aneurysm may vary depending on the size and rate of growth of the aneurysm. Most grow slowly and do not have any symptoms. When symptoms do occur, they may include:  Pain (chest, back, sides, or abdomen). The pain may vary in intensity. A sudden onset of severe pain may indicate that the aneurysm has ruptured.  Hoarseness.  Cough.  Shortness of breath.  Swallowing problems.  Nausea or vomiting or both.  DIAGNOSIS  Since most unruptured thoracic aortic aneurysms have no symptoms, they are often discovered during diagnostic exams for other conditions. An aneurysm may be found during the following procedures:  Ultrasonography (a one-time screening for thoracic aortic aneurysm by ultrasonography is also recommended for all men aged 56-75 years who have ever smoked).  X-ray exams.  A CT scan.  An MRI.  Angiography or arteriography. TREATMENT  Treatment of a thoracic aortic aneurysm depends on the size of your aneurysm, your age, and risk factors for rupture. Medicine to control blood pressure and pain may be used to  manage aneurysms smaller than 2.3 in (6 cm). Regular monitoring for enlargement may be recommended by your health care provider if:  The aneurysm is 1.2-1.5 in (3-4 cm) in size (an annual ultrasonography may be recommended).  The aneurysm is 1.5-1.8 in (4-4.5 cm) in size (an ultrasonography every 6 months may be recommended).  The aneurysm is larger than 1.8 in (4.5 cm) in size (your health care provider may ask that you be examined by a vascular surgeon). If your aneurysm is larger than 2.2 in (5.5 cm) or if it is enlarging quickly, surgical repair may be recommended. There are two main methods for repair of an aneurysm:   Endovascular repair (a minimally invasive surgery).  Open repair. This method is used if an endovascular repair is not possible. Document Released: 06/22/2005 Document Revised: 04/12/2013 Document Reviewed: 01/02/2013 [facility] Patient Information 2015 [place], [place]. This information is not intended to replace advice given to you by your health care provider. Make sure you discuss any questions you have with your health care provider.

## 2014-11-08 LAB — GLUCOSE, CAPILLARY
Glucose-Capillary: 123 mg/dL — ABNORMAL HIGH (ref 70–99)
Glucose-Capillary: 118 mg/dL — ABNORMAL HIGH (ref 70–99)
Glucose-Capillary: 106 mg/dL — ABNORMAL HIGH (ref 70–99)

## 2014-11-08 MED ORDER — CARVEDILOL 25 MG PO TABS
25.0000 mg | ORAL_TABLET | Freq: Two times a day (BID) | ORAL | Status: DC
  Administered 2014-11-08 – 2014-11-12 (×8): 25 mg via ORAL
  Filled 2014-11-08 (×10): qty 1

## 2014-11-08 MED ORDER — FUROSEMIDE 10 MG/ML IJ SOLN
20.0000 mg | Freq: Once | INTRAMUSCULAR | Status: AC
  Administered 2014-11-08: 20 mg via INTRAVENOUS
  Filled 2014-11-08: qty 2

## 2014-11-08 MED ORDER — LISINOPRIL 40 MG PO TABS
40.0000 mg | ORAL_TABLET | Freq: Every day | ORAL | Status: DC
  Administered 2014-11-08 – 2014-11-12 (×5): 40 mg via ORAL
  Filled 2014-11-08 (×5): qty 1

## 2014-11-08 NOTE — Op Note (Signed)
NAMEMEDA, DUDZINSKI NO.:  [id number]  MEDICAL RECORD NO.:  [id number]  LOCATION:  [id number]                        FACILITY:  [facility]  PHYSICIAN:  Lanelle Bal, MD    DATE OF BIRTH:  [birth year]  DATE OF PROCEDURE:  11/03/2014 DATE OF DISCHARGE:                              OPERATIVE REPORT   PREOPERATIVE DIAGNOSIS:  Acute type 1 aortic dissection.  POSTOPERATIVE DIAGNOSIS:  Acute type 1 aortic dissection.  SURGICAL PROCEDURE:  Repair of ascending aortic dissection with replacement of ascending aorta with 30 mm Hemashield graft and resuspension of the aortic valve with hypothermic circulatory arrest and cardiopulmonary bypass.  SURGEON:  Lanelle Bal, MD  FIRST ASSISTANT:  Ellwood Handler, PA  BRIEF HISTORY:  The patient is a 49 year old, hypertensive female, who awoke on Saturday morning with abdominal discomfort and nausea.  She notes that initially she had anterior chest pain that radiated into her back and then into her abdomen.  About noon, she came to the emergency room to seek medical attention.  Initial impression was that the patient had abdominal pain, and a CT scan of the abdomen was performed, this demonstrated aortic dissection involving the abdominal aorta. Unfortunately, a CT scan of the chest was not done.  Vascular Surgery was consulted because of the abdominal aortic dissection and recommended urgent ascending aorta CT of the chest.  This demonstrated type 1 aortic dissection, and the patient was taken directly to the operating room from the emergency room.  At the time of exam, the patient had palpable brachial, radial, femoral, and pedal pulses.  She was neurologically intact.  She had no evidence of lower extremity ischemia or spinal cord ischemia.  The patient was awake and after describing the diagnosis and need for urgent surgery, she was agreeable and signed informed consent.  DESCRIPTION OF PROCEDURE:  A Swan-Ganz and arterial line  monitors were placed.  The patient was taken to the operating room.  General endotracheal anesthesia was performed.  A TEE probe was placed. Appropriate time-out had been performed.  TEE confirmed the CT diagnosis of type 1 aortic dissection with the flap just at the sinotubular ridge with trivial aortic insufficiency.  The skin of the chest and legs was prepped with Betadine and draped in usual sterile manner.  Incision was made in the right infraclavicular area.  Dissection was carried down separating the fibers of the pectoralis major muscle and dividing the pectoralis minor, this gave access to the right subclavian artery which had easily palpable pulse and did not appear to be dissected.  The patient was systemically heparinized.  The vascular clamps were placed on the axillary artery proximally and distally, and the vessel was opened and 8-mm arterial graft with a length of Gore-Tex was then anastomosed to the axillary artery.  We then turned to perform sternotomy.  Pericardium was opened.  There was a trace amount of pericardial fluid but no frank rupture of the dissection.  The right atrium was cannulated.  She was then placed on cardiopulmonary bypass from the right axillary artery with venous return from the right atrium, and right superior pulmonary vein vent was placed. Retrograde cardioplegia catheter was placed into the coronary sinus.  The patient's body temperature was then cooled to 18 degrees.  As we were cooling, the innominate artery origin was dissected to allow clamping.  When we reached 18 degrees, the period of circulatory arrest was commenced.  The clamp was placed on the innominate artery and antegrade cerebral perfusion was begun.  Aorta was divided distally just proximal to the takeoff of the innominate artery.  The aorta was divided, it appeared that the original flap injury was at the sinotubular ridge with the antegrade cerebral perfusion.  There was blood  flow from the left carotid origin.  At the area of the suture line, BioGlue was used and a 30 mL balloon Foley was used to approximate the dissected aorta, holding it in places BioGlue was applied.  We then used a felt strip and picked a 30-mm Hemashield graft.  The distal aortic anastomosis was performed with a running 3-0 Prolene.  CoSeal was placed on the suture edges.  We then transferred back to full flow allowing the aorta to fill and de-air and a cross-clamp was placed on the graft and full flow was reinstituted with a total circle arrest time of 35 minutes and antegrade cerebral perfusion time 28 minutes.  Intermittently cold blood potassium cardioplegia had been administered.  We then turned our attention to the proximal aorta.  The aorta was divided at the sinotubular ridge, this was the origin of the dissection.  Then, dissection carried down short distances along the non-coronary cusp.  The aortic root was not dilated. We then placed a 4-0 Prolene sutures with small pledgets at the each of the 3 aortic valve commissures and secured on outside of the aorta with a second pledget.  The proximal anastomosis was then performed over felt strips with a running 3-0 Prolene.  During this period of time, we were rewarming, the heart was allowed to passively fill, warm retrograde cardioplegia was administered and the aortic cross-clamp was removed with a total cross-clamp time of  38 minutes.  At this point as we were checking for bleeding along the suture line, it became apparent that one of the sutures placed at the commissure of the aortic valve, had broken and the pledgets were loose, this raised a concern of that the pledget on the interior the aorta was loose.  Replaced the clamp on the ascending aorta.  The proximal anastomosis was again partially opened. The loose pledget was easily identified, sitting and cusps of the aortic valve and was removed.  The second suspension suture  was placed, and the aortotomy was closed with a total second cross-clamp time of 13 minutes. With the patient's body temperature rewarmed to 37 degrees, the heart was allowed to passively fill, checking the aortic valve without evidence of aortic insufficiency.  The right superior pulmonary vein vent was removed, and aortic McGowan needle was placed in the ascending aorta to further de-air the heart.  The patient was then ventilated and weaned from cardiopulmonary bypass without difficulty.  She remained hemodynamically stable.  She was decannulated in usual fashion removing the venous line.  Protamine sulfate was administered.  The total pump time was 168 minutes.  A vascular stapler was placed across the graft just above the anastomosis to the axillary artery with operative field hemostatic.  The muscle layers in the axillary cannulation site were reapproximated with running 3-0 subcuticular stitch and skin edges. Because of low fibrinogen and low platelets, the patient was given transfusion of fresh frozen platelets and cryoprecipitate.  Two Blake mediastinal  drains were left in place.  The pericardium was loosely reapproximated.  Sternum was closed with #6 stainless steel wire. Fascia was closed with interrupted 0 Vicryl, running 3-0 Vicryl subcutaneous tissue, and 3-0 subcuticular stitch in skin edges.  Dry dressings were applied.  Sponge and needle count was reported as correct at completion of the procedure.  The patient tolerated the procedure without obvious complication and was transferred to the Surgical Intensive Care Unit for further postoperative care.     Lanelle Bal, MD     EG/MEDQ  D:  11/07/2014  T:  11/08/2014  Job:  [id number]

## 2014-11-08 NOTE — Progress Notes (Addendum)
      [address]       [place] [address]             [phone]        5 Days Post-Op Procedure(s) (LRB): REPLACEMENT ASCENDING AORTA with a 12mm hemashield graft,  resuspension of aortic valve, hypothermic circulatory arrest and cardiopulmonary bypass. (N/A)  Subjective: Patient had bowel movement yesterday. She was washing at the sink and became dizzy and had nausea earlier this am. She is now eating breakfast and feels better.  Objective: Vital signs in last 24 hours: Temp:  [97.9 F (36.6 C)-99.2 F (37.3 C)] 99.2 F (37.3 C) (05/05 0448) Pulse Rate:  [66-84] 66 (05/05 0448) Cardiac Rhythm:  [-] Normal sinus rhythm (05/04 1940) Resp:  [18-20] 18 (05/05 0448) BP: (123-161)/(64-85) 161/74 mmHg (05/05 0448) SpO2:  [90 %-95 %] 91 % (05/05 0448) Weight:  [226 lb 13.7 oz (102.9 kg)] 226 lb 13.7 oz (102.9 kg) (05/05 0448)  Pre op weight  116 kg Current Weight  11/08/14 226 lb 13.7 oz (102.9 kg)       Intake/Output from previous day: 05/04 0701 - 05/05 0700 In: 444 [P.O.:444] Out: 400 [Urine:400]   Physical Exam:  Cardiovascular: RRR Pulmonary: Slightly diminished at bases; no rales, wheezes, or rhonchi. Abdomen: Soft, non tender, bowel sounds present. Extremities: Trace bilateral lower extremity edema. Wounds: Sternal wound has no erythema. There was some sero sanguinous drainage at distal sternum or possibly at chest tube sites.  Lab Results: CBC:  Recent Labs  11/06/14 0500 11/07/14 0353  WBC 11.4* 10.7*  HGB 9.7* 9.6*  HCT 28.8* 28.6*  PLT 153 196   BMET:   Recent Labs  11/06/14 0500 11/07/14 0353  NA 134* 131*  K 3.5 3.8  CL 99* 97*  CO2 28 22  GLUCOSE 97 102*  BUN 19 15  CREATININE 0.89 0.80  CALCIUM 8.3* 8.5*    PT/INR:  Lab Results  Component Value Date   INR 1.36 11/04/2014   ABG:  INR: Will add last result for INR, ABG once components are confirmed Will add last 4 CBG results once components are  confirmed  Assessment/Plan:  1. CV - Hypertensive with SBP in the 140-150's. On Norvasc 10 mg daily, Lopressor 50 mg bid, and Lisinopril 20 mg daily. SBP still above 140+ so will increase Lisinopril for better BP control. 2.  Pulmonary - Was on 1 liter of oxygen via [place]. Weaned to room air. Encourage incentive spirometer 3.  Acute blood loss anemia - H and H stable at 9.6 and 28.6 4. Deconditioned-continue with CRPI  ZIMMERMAN,DONIELLE MPA-C 11/08/2014,7:51 AM    Poss home 1-2 days depending on BP control I have seen and examined [employer] and agree with the above assessment  and plan.  [name] MD Beeper [phone] Office [phone] 11/08/2014 9:29 AM

## 2014-11-08 NOTE — Progress Notes (Addendum)
CARDIAC REHAB PHASE I   PRE:  Rate/Rhythm: 49 SR  BP:  Sitting: 158/68        SaO2: 95 RA  MODE:  Ambulation: 280 ft   POST:  Rate/Rhythm: 75 SR  BP:  Sitting: 157/86         SaO2: 93 RA  Pt ambulated 280 ft on RA, rolling walker, standby assist, slow steady gait. Tolerated fair. Pt c/o mild dizziness and DOE. Brief standing rest x 5.  Pt states she is "trying to stay positive" feels she is moving slowly. Pt c/o soreness in her incision 7/10. RN notified. Encouraged IS, ambulation. Pt verbalized understanding. Pt returned to chair, feet elevated, call bell within reach.   [phone]  [name], RN, BSN 11/08/2014 10:29 AM

## 2014-11-08 NOTE — Progress Notes (Signed)
11/08/2014 1700 Received to room a transfer from 2w24.  Pt is without c/o at this time.  Oriented pt to room, call light and bed.  Call bell in reach. [name]

## 2014-11-08 NOTE — Progress Notes (Signed)
Patient Name: Ashley Guerrero Date of Encounter: 11/08/2014  Principal Problem:   Acute thoracic aortic dissection Type I Active Problems:   S/P ascending aortic replacement   Essential hypertension, malignant   Large  Abdominal mass- question uterine origin    Length of Stay: 4  SUBJECTIVE  Doing fairly well, ambulating. Some dizziness with standing. Nausea remains biggest complaint. BP as low as 123/70 overnight, but SBP in 150s this AM. HR 60-65  CURRENT MEDS . amLODipine  10 mg Oral Daily  . aspirin EC  325 mg Oral Daily  . carvedilol  25 mg Oral BID WC  . docusate sodium  200 mg Oral Daily  . enoxaparin (LOVENOX) injection  30 mg Subcutaneous Q24H  . guaiFENesin  600 mg Oral BID  . lisinopril  40 mg Oral Daily  . pantoprazole  40 mg Oral QAC breakfast  . sodium chloride  3 mL Intravenous Q12H    OBJECTIVE   Intake/Output Summary (Last 24 hours) at 11/08/14 1215 Last data filed at 11/08/14 0456  Gross per 24 hour  Intake    444 ml  Output    400 ml  Net     44 ml   Filed Weights   11/06/14 0400 11/07/14 0439 11/08/14 0448  Weight: 227 lb 8.2 oz (103.2 kg) 228 lb 1.6 oz (103.465 kg) 226 lb 13.7 oz (102.9 kg)    PHYSICAL EXAM Filed Vitals:   11/07/14 2011 11/07/14 2207 11/08/14 0448 11/08/14 0958  BP: 141/72 152/82 161/74 158/68  Pulse: 71 82 66 71  Temp: 97.9 F (36.6 C)  99.2 F (37.3 C)   TempSrc: Oral  Oral   Resp: 20  18   Height:      Weight:   226 lb 13.7 oz (102.9 kg)   SpO2: 90%  91%    General: Alert, oriented x3, no distress Head: no evidence of trauma, PERRL, EOMI, no exophtalmos or lid lag, no myxedema, no xanthelasma; normal ears, nose and oropharynx Neck: normal jugular venous pulsations and no hepatojugular reflux; brisk carotid pulses without delay and no carotid bruits Chest: clear to auscultation, no signs of consolidation by percussion or palpation, normal fremitus, symmetrical and full respiratory excursions Cardiovascular: normal  position and quality of the apical impulse, regular rhythm, normal first and second heart sounds, no rubs or gallops, no murmur Abdomen: no tenderness or distention, no masses by palpation, no abnormal pulsatility or arterial bruits, normal bowel sounds, no hepatosplenomegaly Extremities: no clubbing, cyanosis; mild ankle edema; 2+ radial, ulnar and brachial pulses bilaterally; 2+ right femoral, posterior tibial and dorsalis pedis pulses; 2+ left femoral, posterior tibial and dorsalis pedis pulses; no subclavian or femoral bruits Neurological: grossly nonfocal  LABS  CBC  Recent Labs  11/06/14 0500 11/07/14 0353  WBC 11.4* 10.7*  HGB 9.7* 9.6*  HCT 28.8* 28.6*  MCV 89.2 87.7  PLT 153 734   Basic Metabolic Panel  Recent Labs  11/06/14 0500 11/07/14 0353  NA 134* 131*  K 3.5 3.8  CL 99* 97*  CO2 28 22  GLUCOSE 97 102*  BUN 19 15  CREATININE 0.89 0.80  CALCIUM 8.3* 8.5*   Radiology Studies Imaging results have been reviewed and Dg Chest 2 View  11/07/2014   CLINICAL DATA:  Status post ascending aortic replacement for dissection, known of abdominal mass.  EXAM: CHEST  2 VIEW  COMPARISON:  Portable chest x-ray of Nov 06, 2014  FINDINGS: The lungs are reasonably well inflated. There is persistent atelectasis  or infiltrate in the left lower lobe. There are small bilateral pleural effusions layering posteriorly. The cardiac silhouette remains mildly enlarged. The pulmonary vascularity is normal. There is mild mediastinal prominence which has improved. There are 7 intact sternal wires. The right internal jugular Cordis sheath has been removed. There is multilevel degenerative disc space narrowing.  IMPRESSION: Persistent left lower lobe atelectasis and small bilateral pleural effusions. Overall improvement in the appearance of the chest has occurred since the previous study.   Electronically Signed   By: David  Martinique M.D.   On: 11/07/2014 08:07    TELE NSR  ASSESSMENT AND  PLAN  Target BP 120s/70s. HR getting slower - will switch to carvedilol. On full dose amlodipine and lisinopril. Mild residual hypervolemia may be contributing to difficult BP control? Weight on admission not reliable. Will give a small dose of diuretic.  Sanda Klein, MD, [facility] [phone] office [phone] pager 11/08/2014 12:15 PM

## 2014-11-09 LAB — PREPARE FRESH FROZEN PLASMA
UNIT DIVISION: 0
UNIT DIVISION: 0
UNIT DIVISION: 0
Unit division: 0
Unit division: 0
Unit division: 0

## 2014-11-09 MED ORDER — CEPHALEXIN 500 MG PO CAPS
500.0000 mg | ORAL_CAPSULE | Freq: Three times a day (TID) | ORAL | Status: DC
  Administered 2014-11-09 – 2014-11-12 (×10): 500 mg via ORAL
  Filled 2014-11-09 (×14): qty 1

## 2014-11-09 NOTE — Progress Notes (Addendum)
      [address]       [place] [address]             [phone]        6 Days Post-Op Procedure(s) (LRB): REPLACEMENT ASCENDING AORTA with a 60mm hemashield graft,  resuspension of aortic valve, hypothermic circulatory arrest and cardiopulmonary bypass. (N/A)  Subjective: Patient has no specific complaints this am.  Objective: Vital signs in last 24 hours: Temp:  [97.9 F (36.6 C)-100.7 F (38.2 C)] 97.9 F (36.6 C) (05/06 0539) Pulse Rate:  [69-71] 69 (05/06 0539) Cardiac Rhythm:  [-] Normal sinus rhythm (05/05 2026) Resp:  [18] 18 (05/06 0539) BP: (131-158)/(55-68) 156/67 mmHg (05/06 0539) SpO2:  [96 %-98 %] 98 % (05/06 0539) Weight:  [227 lb 8 oz (103.193 kg)] 227 lb 8 oz (103.193 kg) (05/06 0539)  Pre op weight  116 kg Current Weight  11/09/14 227 lb 8 oz (103.193 kg)         Physical Exam:  Cardiovascular: RRR Pulmonary: Slightly diminished at bases; no rales, wheezes, or rhonchi. Abdomen: Soft, non tender, bowel sounds present. Extremities: Trace bilateral lower extremity edema. Wounds: Sternal wound has no erythema. There was some sero sanguinous and purulence drainage at distal sternum.  Lab Results: CBC:  Recent Labs  11/07/14 0353  WBC 10.7*  HGB 9.6*  HCT 28.6*  PLT 196   BMET:   Recent Labs  11/07/14 0353  NA 131*  K 3.8  CL 97*  CO2 22  GLUCOSE 102*  BUN 15  CREATININE 0.80  CALCIUM 8.5*    PT/INR:  Lab Results  Component Value Date   INR 1.36 11/04/2014   ABG:  INR: Will add last result for INR, ABG once components are confirmed Will add last 4 CBG results once components are confirmed  Assessment/Plan:  1. CV -  On Norvasc 10 mg daily, Coreg 25 mg bid, and Lisinopril 40 mg daily. SBP mostly in the 130's but up to 156 this am.  2.  Pulmonary -On room air. Encourage incentive spirometer 3.  Acute blood loss anemia - Last H and H stable at 9.6 and 28.6 4. Had fever to 100.7. Last WBC 10,700. The  lower sternal wound has some purulence on dressing this am. Will start Keflex as likely from fat necrosis. 5. Possible discharge in am  ZIMMERMAN,DONIELLE MPA-C 11/09/2014,8:04 AM   I have seen and examined the patient and agree with the assessment and plan as outlined.  Must be certain BP under good control prior to d/c home.  I agree that volume overload and post-op pain may be contributing to hypertension.  Defer medical Rx of hypertension to cardiology team.  Rexene Alberts 11/09/2014 11:19 AM

## 2014-11-09 NOTE — Progress Notes (Signed)
  Patient Name: Ashley Guerrero Date of Encounter: 11/09/2014  Principal Problem:   Acute thoracic aortic dissection Type I Active Problems:   S/P ascending aortic replacement   Essential hypertension, malignant   Large  Abdominal mass- question uterine origin    Length of Stay: 5  SUBJECTIVE  Feeling better.  CURRENT MEDS . amLODipine  10 mg Oral Daily  . aspirin EC  325 mg Oral Daily  . carvedilol  25 mg Oral BID WC  . cephALEXin  500 mg Oral 3 times per day  . docusate sodium  200 mg Oral Daily  . enoxaparin (LOVENOX) injection  30 mg Subcutaneous Q24H  . guaiFENesin  600 mg Oral BID  . lisinopril  40 mg Oral Daily  . pantoprazole  40 mg Oral QAC breakfast  . sodium chloride  3 mL Intravenous Q12H    OBJECTIVE   Intake/Output Summary (Last 24 hours) at 11/09/14 1025 Last data filed at 11/09/14 0730  Gross per 24 hour  Intake    240 ml  Output    300 ml  Net    -60 ml   Filed Weights   11/07/14 0439 11/08/14 0448 11/09/14 0539  Weight: 228 lb 1.6 oz (103.465 kg) 226 lb 13.7 oz (102.9 kg) 227 lb 8 oz (103.193 kg)    PHYSICAL EXAM Filed Vitals:   11/08/14 1429 11/08/14 2136 11/09/14 0539 11/09/14 0955  BP: 139/67 131/55 156/67 114/53  Pulse: 70 69 69 67  Temp: 100.7 F (38.2 C) 98.1 F (36.7 C) 97.9 F (36.6 C)   TempSrc: Oral Oral Oral   Resp: 18 18 18 19   Height:      Weight:   227 lb 8 oz (103.193 kg)   SpO2: 96% 97% 98% 100%   General: Alert, oriented x3, no distress Head: no evidence of trauma, PERRL, EOMI, no exophtalmos or lid lag, no myxedema, no xanthelasma; normal ears, nose and oropharynx Neck: normal jugular venous pulsations and no hepatojugular reflux; brisk carotid pulses without delay and no carotid bruits Chest: clear to auscultation, no signs of consolidation by percussion or palpation, normal fremitus, symmetrical and full respiratory excursions Cardiovascular: normal position and quality of the apical impulse, regular rhythm, normal  first and second heart sounds, no rubs or gallops, no murmur Abdomen: no tenderness or distention, no masses by palpation, no abnormal pulsatility or arterial bruits, normal bowel sounds, no hepatosplenomegaly Extremities: no clubbing, cyanosis or edema; 2+ radial, ulnar and brachial pulses bilaterally; 2+ right femoral, posterior tibial and dorsalis pedis pulses; 2+ left femoral, posterior tibial and dorsalis pedis pulses; no subclavian or femoral bruits Neurological: grossly nonfocal  LABS  CBC  Recent Labs  11/07/14 0353  WBC 10.7*  HGB 9.6*  HCT 28.6*  MCV 87.7  PLT 546   Basic Metabolic Panel  Recent Labs  11/07/14 0353  NA 131*  K 3.8  CL 97*  CO2 22  GLUCOSE 102*  BUN 15  CREATININE 0.80  CALCIUM 8.5*   Liver Function Tests  Radiology Studies Imaging results have been reviewed and No results found.  TELE NSR   ASSESSMENT AND PLAN  S/P emergency repair of type A aortic dissection, finally with BP approaching target range. No further adjustments planned in BP meds for now.   Sanda Klein, MD, [facility] [phone] office [phone] pager 11/09/2014 10:25 AM

## 2014-11-09 NOTE — Progress Notes (Addendum)
CARDIAC REHAB PHASE I   Pt ambulated with student nurse prior to beginning education. [facility] discharge education completed. Reviewed IS, sternal precautions, activity progression, exercise, risk factors, heart healthy diet, blood pressure monitoring, phase 2 cardiac rehab. Pt verbalized understanding. Pt states she is under a lot of stress at home. She is from [place] and has been staying with her father here as she helps take care of him.  Her neighbor will be with her temporarily to help after discharge.  Pt states her brother is coming to visit from [facility] on May 17th and she will need to be out of her father's house at that time. States she was hoping to travel to [place] to stay with a friend who is a retired Therapist, sports. Advised pt to discuss plans with surgeon. Pt c/o dizziness and feeling sweaty while sitting in recliner. VSS. RN notified. Dr. Loletha Grayer at bedside also aware. Pt declined to watch cardiac surgery discharge video. Pt agrees to phase 2 cardiac rehab. Will send referral to [facility].   [phone]  [name], RN, BSN 11/09/2014 10:55 AM

## 2014-11-10 LAB — GLUCOSE, CAPILLARY: Glucose-Capillary: 95 mg/dL (ref 70–99)

## 2014-11-10 NOTE — Progress Notes (Signed)
Pt with episode of dizziness and some slurred speech, neuro assessment preform and placed in flowsheet, some mild slurring noted, PERRL, PA Donielle paged and notified of patient complaints and condition, PA also made aware about bp meds to be given this am and will administer as recommended.  Kathleen Argue S  10:15 AM

## 2014-11-10 NOTE — Progress Notes (Signed)
Consulting cardiologist: Dr. Darlin Coco  Seen for followup:  Hypertension  Subjective:    In bedside chair, no chest pain. Some incisional pain.  Objective:   Temp:  [98 F (36.7 C)-99.4 F (37.4 C)] 99.4 F (37.4 C) (05/06 1959) Pulse Rate:  [63-72] 65 (05/06 1959) Resp:  [18-20] 18 (05/06 1959) BP: (110-147)/(53-78) 110/78 mmHg (05/07 0848) SpO2:  [97 %-100 %] 97 % (05/06 1959) Weight:  [226 lb (102.513 kg)] 226 lb (102.513 kg) (05/07 0500) Last BM Date: 11/07/14  Filed Weights   11/08/14 0448 11/09/14 0539 11/10/14 0500  Weight: 226 lb 13.7 oz (102.9 kg) 227 lb 8 oz (103.193 kg) 226 lb (102.513 kg)    Intake/Output Summary (Last 24 hours) at 11/10/14 0853 Last data filed at 11/10/14 0700  Gross per 24 hour  Intake    783 ml  Output   1000 ml  Net   -217 ml    Telemetry: Sinus rhythm.  Exam:  General: Appears comfortable.  Lungs: Decreased breath sounds at the bases, nonlabored.  Cardiac: RRR without gallop.  Extremities: No pitting edema.   Lab Results:  Basic Metabolic Panel:  Recent Labs Lab 11/04/14 0530 11/04/14 1708  11/05/14 0400 11/06/14 0500 11/07/14 0353  NA 139  --   < > 136 134* 131*  K 3.7  --   < > 3.6 3.5 3.8  CL 105  --   < > 101 99* 97*  CO2 24  --   --  26 28 22   GLUCOSE 126*  --   < > 123* 97 102*  BUN 16  --   < > 18 19 15   CREATININE 1.26* 1.23*  < > 1.10* 0.89 0.80  CALCIUM 7.6*  --   --  8.1* 8.3* 8.5*  MG 2.9* 2.7*  --   --   --   --   < > = values in this interval not displayed.   CBC:  Recent Labs Lab 11/05/14 0400 11/06/14 0500 11/07/14 0353  WBC 14.1* 11.4* 10.7*  HGB 10.3* 9.7* 9.6*  HCT 30.6* 28.8* 28.6*  MCV 87.2 89.2 87.7  PLT 170 153 196    ECG: Tracing from May 6 shows sinus rhythm with nonspecific T-wave changes.   Medications:   Scheduled Medications: . amLODipine  10 mg Oral Daily  . aspirin EC  325 mg Oral Daily  . carvedilol  25 mg Oral BID WC  . cephALEXin  500 mg Oral 3  times per day  . docusate sodium  200 mg Oral Daily  . enoxaparin (LOVENOX) injection  30 mg Subcutaneous Q24H  . guaiFENesin  600 mg Oral BID  . lisinopril  40 mg Oral Daily  . pantoprazole  40 mg Oral QAC breakfast  . sodium chloride  3 mL Intravenous Q12H      PRN Medications:  sodium chloride, bisacodyl **OR** bisacodyl, labetalol, magnesium hydroxide, ondansetron **OR** ondansetron (ZOFRAN) IV, oxyCODONE, sodium chloride, traMADol   Assessment:   1. 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 Current medications include Coreg, lisinopril, and will amlodipine. Initial systolic blood pressure 381W this morning, recheck by nurse around time of my examination was 110/78.  2. Postoperative day 7 status post ascending aortic replacement with 37mm hemashield graft, resuspension of aortic valve, hypothermic circulatory arrest and cardiopulmonary bypass.    Plan/Discussion:    At this point would continue current antihypertensive regimen. HCTZ could be added  next if needed, but suspect trend will continue to improve. Office follow-up has been arranged already. Disposition pending per primary team.   Satira Sark, M.D., F.A.C.C.

## 2014-11-10 NOTE — Progress Notes (Addendum)
      [address]       [place] [address]             [phone]        7 Days Post-Op Procedure(s) (LRB): REPLACEMENT ASCENDING AORTA with a 17mm hemashield graft,  resuspension of aortic valve, hypothermic circulatory arrest and cardiopulmonary bypass. (N/A)  Subjective: Patient's main complaint is incisional pain.  Objective: Vital signs in last 24 hours: Temp:  [98 F (36.7 C)-99.4 F (37.4 C)] 99.4 F (37.4 C) (05/06 1959) Pulse Rate:  [63-72] 65 (05/06 1959) Cardiac Rhythm:  [-] Normal sinus rhythm (05/06 2100) Resp:  [18-20] 18 (05/06 1959) BP: (114-147)/(53-72) 134/61 mmHg (05/06 1959) SpO2:  [97 %-100 %] 97 % (05/06 1959) Weight:  [226 lb (102.513 kg)] 226 lb (102.513 kg) (05/07 0500)  Pre op weight  116 kg Current Weight  11/10/14 226 lb (102.513 kg)      05/06 0701 - 05/07 0700 In: 963 [P.O.:960; I.V.:3] Out: 1300 [Urine:1300]  Physical Exam:  Cardiovascular: RRR Pulmonary: Clear to auscultation bilaterally; no rales, wheezes, or rhonchi. Abdomen: Soft, non tender, bowel sounds present. Extremities: Trace bilateral lower extremity edema. Wounds: Sternal wound has no erythema. There was some sero sanguinous and purulence drainage at distal sternum.  Lab Results: CBC: No results for input(s): WBC, HGB, HCT, PLT in the last 72 hours. BMET:  No results for input(s): NA, K, CL, CO2, GLUCOSE, BUN, CREATININE, CALCIUM in the last 72 hours.  PT/INR:  Lab Results  Component Value Date   INR 1.36 11/04/2014   ABG:  INR: Will add last result for INR, ABG once components are confirmed Will add last 4 CBG results once components are confirmed  Assessment/Plan:  1. CV -  On Norvasc 10 mg daily, Coreg 25 mg bid, and Lisinopril 40 mg daily. SBP mostly in the 130's but up to 156 this am.  2.  Pulmonary -On room air. Encourage incentive spirometer 3.  Acute blood loss anemia - Last H and H stable at 9.6 and 28.6 4. Had fever to 99.4 this  am. Last WBC 10,700. The lower sternal wound has some drainage and was started on Keflex yesterday.Likely from fat necrosis. 5. Will discuss disposition with surgeon  ZIMMERMAN,DONIELLE MPA-C 11/10/2014,7:46 AM     I have seen and examined the patient and agree with the assessment and plan as outlined.  Potentially ready for d/c home tomorrow if sternal drainage decreased and no fevers.  Rexene Alberts 11/10/2014 11:34 AM

## 2014-11-11 NOTE — Progress Notes (Signed)
Pt ambulated 500 ft independently and tolerated well. Pt resting comfortably in the chair call bell within reach.   Will continue to monitor.   Ashley Guerrero

## 2014-11-11 NOTE — Progress Notes (Signed)
Sternal dressing saturated. Dressing changed. Lower 0.5 inch dehisced area of incision with small amount of clear serous drainage and scant creamy yellow drainage. Surrounding skin without redness, edema or firmness.

## 2014-11-11 NOTE — Progress Notes (Signed)
Consulting cardiologist: Dr. Darlin Coco  Seen for followup:  Hypertension  Subjective:    Patient states feels better today. Had a walk this morning. Mild incisional pain.  Objective:   Temp:  [98 F (36.7 C)-98.6 F (37 C)] 98.6 F (37 C) (05/08 0512) Pulse Rate:  [61-71] 62 (05/08 0512) Resp:  [16] 16 (05/08 0512) BP: (113-158)/(55-76) 158/76 mmHg (05/08 0512) SpO2:  [97 %-98 %] 98 % (05/08 0512) Weight:  [226 lb 8 oz (102.74 kg)] 226 lb 8 oz (102.74 kg) (05/08 0512) Last BM Date: 11/09/14  Filed Weights   11/09/14 0539 11/10/14 0500 11/11/14 0512  Weight: 227 lb 8 oz (103.193 kg) 226 lb (102.513 kg) 226 lb 8 oz (102.74 kg)    Intake/Output Summary (Last 24 hours) at 11/11/14 0853 Last data filed at 11/10/14 1700  Gross per 24 hour  Intake    123 ml  Output      0 ml  Net    123 ml    Telemetry: Sinus rhythm.  Exam:  General: Appears comfortable.  Lungs: Decreased breath sounds at the bases, nonlabored.  Cardiac: RRR without gallop.  Extremities: No pitting edema.   Lab Results:  Basic Metabolic Panel:  Recent Labs Lab 11/04/14 1708  11/05/14 0400 11/06/14 0500 11/07/14 0353  NA  --   < > 136 134* 131*  K  --   < > 3.6 3.5 3.8  CL  --   < > 101 99* 97*  CO2  --   --  26 28 22   GLUCOSE  --   < > 123* 97 102*  BUN  --   < > 18 19 15   CREATININE 1.23*  < > 1.10* 0.89 0.80  CALCIUM  --   --  8.1* 8.3* 8.5*  MG 2.7*  --   --   --   --   < > = values in this interval not displayed.   CBC:  Recent Labs Lab 11/05/14 0400 11/06/14 0500 11/07/14 0353  WBC 14.1* 11.4* 10.7*  HGB 10.3* 9.7* 9.6*  HCT 30.6* 28.8* 28.6*  MCV 87.2 89.2 87.7  PLT 170 153 196    ECG: Tracing from May 6 shows sinus rhythm with nonspecific T-wave changes.   Medications:   Scheduled Medications: . amLODipine  10 mg Oral Daily  . aspirin EC  325 mg Oral Daily  . carvedilol  25 mg Oral BID WC  . cephALEXin  500 mg Oral 3 times per day  . docusate  sodium  200 mg Oral Daily  . enoxaparin (LOVENOX) injection  30 mg Subcutaneous Q24H  . guaiFENesin  600 mg Oral BID  . lisinopril  40 mg Oral Daily  . pantoprazole  40 mg Oral QAC breakfast  . sodium chloride  3 mL Intravenous Q12H     PRN Medications: sodium chloride, bisacodyl **OR** bisacodyl, labetalol, magnesium hydroxide, ondansetron **OR** ondansetron (ZOFRAN) IV, oxyCODONE, sodium chloride, traMADol   Assessment:   1. 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 Current medications include Coreg, lisinopril, and will amlodipine. SBP 130-150 range.  2. Postoperative day 8 status post ascending aortic replacement with 46mm hemashield graft, resuspension of aortic valve, hypothermic circulatory arrest and cardiopulmonary bypass.    Plan/Discussion:    Continue current antihypertensive regimen. HCTZ could be added next if needed, but suspect trend will continue to improve. Office follow-up has been arranged already. Disposition pending per  primary team.   Satira Sark, M.D., F.A.C.C.

## 2014-11-11 NOTE — Progress Notes (Addendum)
      [address]       [place] [address]             [phone]        8 Days Post-Op Procedure(s) (LRB): REPLACEMENT ASCENDING AORTA with a 30mm hemashield graft,  resuspension of aortic valve, hypothermic circulatory arrest and cardiopulmonary bypass. (N/A)  Subjective: Patient's main complaint is incisional pain. She did have an episode of dizziness, mild speech slurring yesterday but no focal deficits.  Objective: Vital signs in last 24 hours: Temp:  [98 F (36.7 C)-98.6 F (37 C)] 98.6 F (37 C) (05/08 0512) Pulse Rate:  [61-71] 62 (05/08 0512) Cardiac Rhythm:  [-] Normal sinus rhythm (05/07 2037) Resp:  [16] 16 (05/08 0512) BP: (110-158)/(55-78) 158/76 mmHg (05/08 0512) SpO2:  [97 %-98 %] 98 % (05/08 0512) Weight:  [226 lb 8 oz (102.74 kg)] 226 lb 8 oz (102.74 kg) (05/08 0512)  Pre op weight  116 kg Current Weight  11/11/14 226 lb 8 oz (102.74 kg)      05/07 0701 - 05/08 0700 In: 123 [P.O.:120; I.V.:3] Out: -   Physical Exam:  Cardiovascular: RRR Pulmonary: Clear to auscultation bilaterally; no rales, wheezes, or rhonchi. Abdomen: Soft, non tender, bowel sounds present. Extremities: Trace bilateral lower extremity edema. Wounds: Sternal wound has no erythema. There is some sero sanguinous drainage at distal sternum. Neurologic: Grossly intact without focal deficits  Lab Results: CBC: No results for input(s): WBC, HGB, HCT, PLT in the last 72 hours. BMET:  No results for input(s): NA, K, CL, CO2, GLUCOSE, BUN, CREATININE, CALCIUM in the last 72 hours.  PT/INR:  Lab Results  Component Value Date   INR 1.36 11/04/2014   ABG:  INR: Will add last result for INR, ABG once components are confirmed Will add last 4 CBG results once components are confirmed  Assessment/Plan:  1. CV -  On Norvasc 10 mg daily, Coreg 25 mg bid, and Lisinopril 40 mg daily. SBP mostly in the 130's but up to 158 this am.  2.  Pulmonary -On room air.  Encourage incentive spirometer 3.  Acute blood loss anemia - Last H and H stable at 9.6 and 28.6 4. No further fever.Last WBC 10,700. The lower sternal wound has some drainage and was started on Keflex.Likely from fat necrosis. 5. Likely discharge in am  ZIMMERMAN,DONIELLE MPA-C 11/11/2014,7:41 AM   I have seen and examined the patient and agree with the assessment and plan as outlined.  Feels better.  BP under better control but still some room for improvement.  Still w/ some thin serosanguinous drainage from sternal incision c/w fat necrosis.  Possible d/c home 1-2 days.  Rexene Alberts 11/11/2014 10:36 AM

## 2014-11-12 MED ORDER — CARVEDILOL 25 MG PO TABS: 25.0000 mg | ORAL_TABLET | Freq: Two times a day (BID) | ORAL | Status: DC

## 2014-11-12 MED ORDER — AMLODIPINE BESYLATE 10 MG PO TABS: 10.0000 mg | ORAL_TABLET | Freq: Every day | ORAL | Status: DC

## 2014-11-12 MED ORDER — ASPIRIN 325 MG PO TBEC: 325.0000 mg | DELAYED_RELEASE_TABLET | Freq: Every day | ORAL | Status: DC

## 2014-11-12 MED ORDER — OXYCODONE HCL 5 MG PO TABS: 5.0000 mg | ORAL_TABLET | ORAL | Status: DC | PRN

## 2014-11-12 MED ORDER — LISINOPRIL 40 MG PO TABS: 40.0000 mg | ORAL_TABLET | Freq: Every day | ORAL | Status: DC

## 2014-11-12 MED ORDER — CEPHALEXIN 500 MG PO CAPS: 500.0000 mg | ORAL_CAPSULE | Freq: Three times a day (TID) | ORAL | Status: DC

## 2014-11-12 NOTE — Progress Notes (Signed)
      [address]       [place] [address]             [phone]      9 Days Post-Op Procedure(s) (LRB): REPLACEMENT ASCENDING AORTA with a 57mm hemashield graft,  resuspension of aortic valve, hypothermic circulatory arrest and cardiopulmonary bypass. (N/A) Subjective: conts to do well  Objective: Vital signs in last 24 hours: Temp:  [98.6 F (37 C)-98.7 F (37.1 C)] 98.6 F (37 C) (05/09 0442) Pulse Rate:  [64-69] 64 (05/09 0442) Cardiac Rhythm:  [-] Normal sinus rhythm (05/08 1949) Resp:  [16-18] 18 (05/09 0442) BP: (127-143)/(53-72) 143/57 mmHg (05/09 0442) SpO2:  [97 %-98 %] 97 % (05/09 0442) Weight:  [229 lb 15 oz (104.3 kg)] 229 lb 15 oz (104.3 kg) (05/09 0249)  Hemodynamic parameters for last 24 hours:    Intake/Output from previous day: 05/08 0701 - 05/09 0700 In: 265 [P.O.:265] Out: -  Intake/Output this shift:    General appearance: alert, cooperative and no distress Heart: regular rate and rhythm Lungs: clear to auscultation bilaterally Abdomen: benign Extremities: minor LE edema Wound: incis healing well  Lab Results: No results for input(s): WBC, HGB, HCT, PLT in the last 72 hours. BMET: No results for input(s): NA, K, CL, CO2, GLUCOSE, BUN, CREATININE, CALCIUM in the last 72 hours.  PT/INR: No results for input(s): LABPROT, INR in the last 72 hours. ABG    Component Value Date/Time   PHART 7.484* 11/04/2014 1542   HCO3 27.3* 11/04/2014 1542   TCO2 21 11/04/2014 1715   ACIDBASEDEF 1.0 11/04/2014 1454   O2SAT 96.0 11/04/2014 1542   CBG (last 3)   Recent Labs  11/10/14 1028  GLUCAP 95    Meds Scheduled Meds: . amLODipine  10 mg Oral Daily  . aspirin EC  325 mg Oral Daily  . carvedilol  25 mg Oral BID WC  . cephALEXin  500 mg Oral 3 times per day  . docusate sodium  200 mg Oral Daily  . enoxaparin (LOVENOX) injection  30 mg Subcutaneous Q24H  . guaiFENesin  600 mg Oral BID  . lisinopril  40 mg Oral Daily  .  pantoprazole  40 mg Oral QAC breakfast  . sodium chloride  3 mL Intravenous Q12H   Continuous Infusions:  PRN Meds:.sodium chloride, bisacodyl **OR** bisacodyl, labetalol, magnesium hydroxide, ondansetron **OR** ondansetron (ZOFRAN) IV, oxyCODONE, sodium chloride, traMADol  Xrays No results found.  Assessment/Plan: S/P Procedure(s) (LRB): REPLACEMENT ASCENDING AORTA with a 54mm hemashield graft,  resuspension of aortic valve, hypothermic circulatory arrest and cardiopulmonary bypass. (N/A) Plan for discharge: see discharge orders   LOS: 8 days    Blayke Pinera E 11/12/2014

## 2014-11-12 NOTE — Progress Notes (Signed)
    Consulting cardiologist: Dr. Darlin Coco  Seen for followup:  Hypertension  Subjective:    Patient states feels better today.Minimal dyspnea.  Mild incisional pain.  Objective:   Temp:  [98.6 F (37 C)-98.7 F (37.1 C)] 98.6 F (37 C) (05/09 0442) Pulse Rate:  [64-69] 64 (05/09 0442) Resp:  [16-18] 18 (05/09 0442) BP: (127-143)/(53-72) 143/57 mmHg (05/09 0442) SpO2:  [97 %-98 %] 97 % (05/09 0442) Weight:  [229 lb 15 oz (104.3 kg)] 229 lb 15 oz (104.3 kg) (05/09 0249) Last BM Date: 11/11/14  Filed Weights   11/10/14 0500 11/11/14 0512 11/12/14 0249  Weight: 226 lb (102.513 kg) 226 lb 8 oz (102.74 kg) 229 lb 15 oz (104.3 kg)    Intake/Output Summary (Last 24 hours) at 11/12/14 0948 Last data filed at 11/12/14 0815  Gross per 24 hour  Intake    505 ml  Output      0 ml  Net    505 ml    Telemetry: Sinus rhythm.  Exam:  General: Appears comfortable.  Lungs: Decreased breath sounds at the bases, nonlabored.  Cardiac: RRR without gallop.  Extremities: No pitting edema.   Lab Results:  Basic Metabolic Panel:  Recent Labs Lab 11/06/14 0500 11/07/14 0353  NA 134* 131*  K 3.5 3.8  CL 99* 97*  CO2 28 22  GLUCOSE 97 102*  BUN 19 15  CREATININE 0.89 0.80  CALCIUM 8.3* 8.5*     CBC:  Recent Labs Lab 11/06/14 0500 11/07/14 0353  WBC 11.4* 10.7*  HGB 9.7* 9.6*  HCT 28.8* 28.6*  MCV 89.2 87.7  PLT 153 196    ECG: Tracing from May 6 shows sinus rhythm with nonspecific T-wave changes.   Medications:   Scheduled Medications: . amLODipine  10 mg Oral Daily  . aspirin EC  325 mg Oral Daily  . carvedilol  25 mg Oral BID WC  . cephALEXin  500 mg Oral 3 times per day  . docusate sodium  200 mg Oral Daily  . enoxaparin (LOVENOX) injection  30 mg Subcutaneous Q24H  . guaiFENesin  600 mg Oral BID  . lisinopril  40 mg Oral Daily  . pantoprazole  40 mg Oral QAC breakfast  . sodium chloride  3 mL Intravenous Q12H     PRN Medications: sodium  chloride, bisacodyl **OR** bisacodyl, labetalol, magnesium hydroxide, ondansetron **OR** ondansetron (ZOFRAN) IV, oxyCODONE, sodium chloride, traMADol   Assessment:   1. 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 Current medications include Coreg, lisinopril, and amlodipine (new). SBP 130-150 range.  2. Postoperative day 9 status post ascending aortic replacement with 64mm hemashield graft, resuspension of aortic valve, hypothermic circulatory arrest and cardiopulmonary bypass.    Plan/Discussion:    Continue current antihypertensive regimen. HCTZ could be added next if needed, but suspect trend will continue to improve. Office follow-up has been arranged already. Disposition pending per primary team. Sounds like she is leaving today. Excellent.   Candee Furbish, MD

## 2014-11-12 NOTE — Progress Notes (Signed)
Pt discharged home per W/C. Sutures removed. Steri strips placed over chest tube insertion site. IV removed skin clean, dry & intact. Discharge instructions reviewed with pt. All questions answered and concerns addressed.   Raliegh Ip RN

## 2014-11-13 NOTE — Care Management Note (Signed)
    Page 1 of 1   11/13/2014     12:32:39 PM CARE MANAGEMENT NOTE 11/13/2014  Patient:  Ashley Guerrero, Ashley Guerrero   Account Number:  [id number]  Date Initiated:  11/12/2014  Documentation initiated by:  Marvetta Gibbons  Subjective/Objective Assessment:   Pt admitted with acute thoracic aortic dissection - s/p emergent AAA     Action/Plan:   PTA pt home dad, - plan to return home   Anticipated DC Date:  11/12/2014   Anticipated DC Plan:  HOME/SELF CARE         Choice offered to / List presented to:             Status of service:  Completed, signed off Medicare Important Message given?  NO (If response is "NO", the following Medicare IM given date fields will be blank) Date Medicare IM given:   Medicare IM given by:   Date Additional Medicare IM given:   Additional Medicare IM given by:    Discharge Disposition:  HOME/SELF CARE  Per UR Regulation:  Reviewed for med. necessity/level of care/duration of stay  If discussed at [facility] [place] of Stay Meetings, dates discussed:    Comments:

## 2014-11-19 ENCOUNTER — Other Ambulatory Visit: Payer: Self-pay | Admitting: *Deleted

## 2014-11-19 ENCOUNTER — Ambulatory Visit
Admission: RE | Admit: 2014-11-19 | Discharge: 2014-11-19 | Disposition: A | Discharge status: Home / Self Care | Payer: 59 | Source: Ambulatory Visit | Attending: Cardiothoracic Surgery | Admitting: Cardiothoracic Surgery

## 2014-11-19 ENCOUNTER — Ambulatory Visit (INDEPENDENT_AMBULATORY_CARE_PROVIDER_SITE_OTHER): Payer: 59 | Admitting: Surgical

## 2014-11-19 VITALS — BP 137/72 | HR 67 | Resp 16 | Ht 66.0 in | Wt 211.5 lb

## 2014-11-19 DIAGNOSIS — G8918 Other acute postprocedural pain: Secondary | ICD-10-CM

## 2014-11-19 DIAGNOSIS — Z9889 Other specified postprocedural states: Secondary | ICD-10-CM

## 2014-11-19 DIAGNOSIS — I7100 Dissection of unspecified site of aorta: Secondary | ICD-10-CM

## 2014-11-19 MED ORDER — OXYCODONE HCL 5 MG PO TABS: 5.0000 mg | ORAL_TABLET | ORAL | Status: DC | PRN

## 2014-11-19 NOTE — Progress Notes (Signed)
[address]       [place], [address]             [phone]                  Ashley Guerrero [place] Medical Record #[id number] Date of Birth: 06-29-1966  Referring OM:VEHMCNOBS, Ashley Moores, MD Primary Cardiology: Primary Care:Pcp Not In System  Chief Complaint:  Follow Up Visit  DATE OF PROCEDURE: 11/03/2014 DATE OF DISCHARGE:   OPERATIVE REPORT   PREOPERATIVE DIAGNOSIS: Acute type 1 aortic dissection.  POSTOPERATIVE DIAGNOSIS: Acute type 1 aortic dissection.  SURGICAL PROCEDURE: Repair of ascending aortic dissection with replacement of ascending aorta with 30 mm Hemashield graft and resuspension of the aortic valve with hypothermic circulatory arrest and cardiopulmonary bypass.  SURGEON: Ashley Bal, MD  FIRST ASSISTANT: Ashley Handler, PA  History of Present Illness:    The patient is a 49 year old black female status post the above procedure seen in the office on today's date in follow-up. She wished to discuss traveling back to [place] where she lives so she can deal with her personal affairs. She continues to progress nicely. She is a little uncomfortable lying flat at this time and sleeps in a reclining chair. This appears to be mostly related to sternal discomfort but she does get slight shortness of breath when flat as well as. At the time of discharge she was on Keflex forward appeared to be fat necrosis but has had no significant drainage from the incision. She denies fevers, chills or other constitutional symptoms. Her activity status is showing good and gradual improvement.       Zubrod Score: At the time of surgery this patient's most appropriate activity status/level should be described as: [x]     0    Normal activity, no symptoms []     1    Restricted in physical strenuous activity but ambulatory, able to do out light work []     2    Ambulatory and capable of self care, unable to do work  activities, up and about                 >50 % of waking hours                                                                                   []     3    Only limited self care, in bed greater than 50% of waking hours []     4    Completely disabled, no self care, confined to bed or chair []     5    Moribund  History  Smoking status  . Current Every Day Smoker -- 1.00 packs/day  . Types: Cigarettes  Smokeless tobacco  . Not on file       No Known Allergies  Current Outpatient Prescriptions  Medication Sig Dispense Refill  . amLODipine (NORVASC) 10 MG tablet Take 1 tablet (10 mg total) by mouth daily. 30 tablet 1  . aspirin EC 325 MG EC tablet Take 1 tablet (325 mg total) by mouth daily.    . carvedilol (COREG) 25 MG tablet Take  1 tablet (25 mg total) by mouth 2 (two) times daily with a meal. 60 tablet 1  . lisinopril (PRINIVIL,ZESTRIL) 40 MG tablet Take 1 tablet (40 mg total) by mouth daily. 30 tablet 1  . oxyCODONE (OXY IR/ROXICODONE) 5 MG immediate release tablet Take 1-2 tablets (5-10 mg total) by mouth every 4 (four) hours as needed for severe pain. 50 tablet 0   No current facility-administered medications for this visit.       Physical Exam: BP 137/72 mmHg  Pulse 67  Resp 16  Ht 5\' 6"  (1.676 m)  Wt 211 lb 8 oz (95.936 kg)  BMI 34.15 kg/m2  SpO2 98%  LMP 10/29/2014  General appearance: alert, cooperative and no distress Heart: regular rate and rhythm, S1, S2 normal and Soft systolic murmur 1/6 Lungs: clear to auscultation bilaterally Abdomen: soft, non-tender; bowel sounds normal; no masses,  no organomegaly Extremities: Trace edema Wounds: Incisions healing well without evidence of infection.  Diagnostic Studies & Laboratory data:         Recent Radiology Findings: Dg Chest 2 View  11/19/2014   CLINICAL DATA:  Follow-up from aortic dissection surgery in April 2016, occasional shortness of breath with exertion  EXAM: CHEST  2 VIEW  COMPARISON:  PA and lateral  chest x-ray of Nov 07, 2014  FINDINGS: The lungs are adequately inflated. There has been interval clearing of left lower lobe atelectasis. A small amount of atelectasis persists as does a trace of pleural fluid. The right lung is clear. The cardiac silhouette is mildly enlarged. The central pulmonary vascularity is mildly prominent but there is no cephalization. There is tortuosity of the descending thoracic aorta. There are 7 intact sternal wires. The upper mediastinum remains mildly prominent.  IMPRESSION: Ongoing improvement at the left lung base consistent with resolving atelectasis and pleural effusion. There is some persistent abnormality here however. Stable enlargement of the cardiac silhouette without evidence of pulmonary edema.   Electronically Signed   By: David  Martinique M.D.   On: 11/19/2014 14:00      I have independently reviewed the above radiology findings and reviewed findings  with the patient.  Recent Labs: Lab Results  Component Value Date   WBC 10.7* 11/07/2014   HGB 9.6* 11/07/2014   HCT 28.6* 11/07/2014   PLT 196 11/07/2014   GLUCOSE 102* 11/07/2014   ALT 14 11/03/2014   AST 17 11/03/2014   NA 131* 11/07/2014   K 3.8 11/07/2014   CL 97* 11/07/2014   CREATININE 0.80 11/07/2014   BUN 15 11/07/2014   CO2 22 11/07/2014   INR 1.36 11/04/2014      Assessment / Plan:  The patient is overall doing well. She was seen in conjunction with Dr. Servando Snare. He feels that it will be okay for her to travel to [place]. She intends to come back for her follow-up with cardiology as well as with [place]. She does have a primary physician who can assist with medical management when she is in [place]. He may additionally refer her to cardiology for the more complex aspects of her care. She understands her continued lifting and activity restrictions. We will see her again in 2 months for follow-up and she will be scheduled at that time for follow-up CT scans with the first to occur 6 months post  surgery.        Ashley Guerrero E 11/19/2014 2:28 PM

## 2014-11-19 NOTE — Patient Instructions (Signed)
The patient stands ongoing lifting and activity restrictions. She also understands the need for ongoing close blood pressure monitoring and control with medications.

## 2014-11-30 ENCOUNTER — Telehealth: Payer: Self-pay

## 2014-11-30 DIAGNOSIS — G8918 Other acute postprocedural pain: Secondary | ICD-10-CM

## 2014-11-30 MED ORDER — HYDROCODONE-ACETAMINOPHEN 5-325 MG PO TABS: 1.0000 | ORAL_TABLET | Freq: Four times a day (QID) | ORAL | Status: DC | PRN

## 2014-11-30 NOTE — Telephone Encounter (Signed)
Patient requested to change pain medication. The oxycodone is making her feel "loopy" and confused. Will change to Hydrocodone 5/325 mg 1 every 6-8 hours prn/ pain. She will pick up RX at front desk today.

## 2014-12-05 ENCOUNTER — Telehealth: Payer: Self-pay | Admitting: Cardiology

## 2014-12-05 NOTE — Telephone Encounter (Signed)
New Message       Pt calling stating that she has been previously told that she did not need to come to our office to be seen on 12/06/14 and thought this appt was already canceled but then she received a phone call from one of the schedulers to remind her of her appt for tomorrow w/ Richardson Dopp and pt told the scheduler that her appt should have already been canceled b/c she was already seen, so the scheduler canceled the appt for her. Now the pt is calling because she wants to know why there has been so much confusion and she wants to hear back from a nurse or a doctor telling her if she needs to come in tomorrow or not. Please call back and advise.

## 2014-12-05 NOTE — Progress Notes (Signed)
Cardiology Office Note   Date:  12/06/2014   ID:  Ashley Guerrero, DOB Mar 30, 1966, MRN [id number]  Patient Care Team: Pcp Not In System as PCP - General Darlin Coco, MD as Consulting Physician (Cardiology) [name], MD as Consulting Physician (Cardiothoracic Surgery)    Chief Complaint  Patient presents with  . Hospitalization Follow-up    s/p repair of Aortic Dissection  . Follow-up    Hypertension     History of Present Illness: Ashley Guerrero is a 49 y.o. female from [place] with a hx of HTN and tobacco abuse.  She was visiting her elderly father in [place] when she was admitted 4/30-5/8 with Type 1 ascending thoracic aortic dissection.  She had previously stopped taking her medication.  She underwent replacement of the ascending aorta with a 30 mm hemashield graft, re-suspension of the aortic valve.  Her dissection started just above the AV extending through the arch and descending aorta into the L CIA, with numerous fenestrations and equal perfusion of both kidneys.  Post op course was fairly uneventful.  Cardiology followed her to manage her BP.  Of note, a large abdominal mass was noted on CT and this was felt to likely be a fibroid tumor.  She returns for FU.  She has been slowly progressing since discharge from the hospital. She remains sore in her chest. She continues to sleep in a recliner due to pain. She denies PND. She denies LE edema. She denies weight changes. She does feel tired. She traveled back to [place] recently. Since then, she has noted fatigue as well as worsening dyspnea with exertion. Overall, she is NYHA 2b.   Studies/Reports Reviewed Today:  None    Past Medical History  Diagnosis Date  . Hypertension   . Fibroid uterus   . Aortic dissection     Status post repair 5/16    Past Surgical History  Procedure Laterality Date  . Replacement ascending aorta N/A 11/03/2014    Procedure: REPLACEMENT ASCENDING AORTA with a 74mm hemashield  graft,  resuspension of aortic valve, hypothermic circulatory arrest and cardiopulmonary bypass.;  Surgeon: [name], MD;  Location: [facility];  Service: Open Heart Surgery;  Laterality: N/A;     Current Outpatient Prescriptions  Medication Sig Dispense Refill  . amLODipine (NORVASC) 10 MG tablet Take 1 tablet (10 mg total) by mouth daily. 30 tablet 1  . aspirin EC 325 MG EC tablet Take 1 tablet (325 mg total) by mouth daily.    . carvedilol (COREG) 25 MG tablet Take 1 tablet (25 mg total) by mouth 2 (two) times daily with a meal. 60 tablet 1  . HYDROcodone-acetaminophen (NORCO/VICODIN) 5-325 MG per tablet Take 1 tablet by mouth every 6 (six) hours as needed for moderate pain or severe pain. 40 tablet 0  . lisinopril (PRINIVIL,ZESTRIL) 40 MG tablet Take 1 tablet (40 mg total) by mouth daily. 30 tablet 1   No current facility-administered medications for this visit.    Allergies:   Review of patient's allergies indicates no known allergies.    Social History:  The patient  reports that she has been smoking Cigarettes.  She started smoking about 4 weeks ago. She has been smoking about 1.00 pack per day. She does not have any smokeless tobacco history on file. She reports that she drinks alcohol. She reports that she uses illicit drugs (Marijuana).   Family History:  The patient's family history includes Cancer in her mother and sister; Diabetes in her  father; Heart failure in her father; Hyperlipidemia in her father; Hypertension in her brother, father, mother, and sister; Stroke in her father. There is no history of Heart attack.    ROS:   Please see the history of present illness.   Review of Systems  Constitution: Positive for malaise/fatigue.  Cardiovascular: Positive for chest pain, dyspnea on exertion and irregular heartbeat.  All other systems reviewed and are negative.     PHYSICAL EXAM: VS:  BP 130/70 mmHg  Pulse 68  Ht 5\' 6"  (1.676 m)  Wt 205 lb (92.987 kg)  BMI  33.10 kg/m2  SpO2 97%    Wt Readings from Last 3 Encounters:  12/06/14 205 lb (92.987 kg)  11/19/14 211 lb 8 oz (95.936 kg)  11/12/14 229 lb 15 oz (104.3 kg)     GEN: Well nourished, well developed, in no acute distress HEENT: normal Neck: no JVD,  no masses Cardiac:  Normal S1/S2, RRR; no murmur,  no rubs or gallops, no edema  Respiratory:  clear to auscultation bilaterally, no wheezing, rhonchi or rales. GI: soft, nontender, nondistended, + BS MS: no deformity or atrophy Skin: warm and dry  Neuro:  CNs II-XII intact, Strength and sensation are intact Psych: Normal affect   EKG:  EKG is ordered today.  It demonstrates:   NSR, HR 68, normal axis, inf-lat TWI   Recent Labs: 11/03/2014: ALT 14 11/04/2014: Magnesium 2.7* 11/07/2014: BUN 15; Creatinine 0.80; Hemoglobin 9.6*; Platelets 196; Potassium 3.8; Sodium 131*    Lipid Panel No results found for: CHOL, TRIG, HDL, CHOLHDL, VLDL, LDLCALC, LDLDIRECT    ASSESSMENT AND PLAN:  Acute thoracic aortic dissection Type I S/P ascending aortic replacement:  She is progressing slowly since discharge from the hospital. I think she would benefit from cardiac rehabilitation. She has been in contact with her primary care physician at home to get this arranged. She spends about a week here every 1-2 months to take care of her father. She plans to follow-up with Dr. Servando Snare here as well as Dr. Mare Ferrari. Blood pressure is well controlled. Continue current therapy.  Essential hypertension, malignant:  Blood pressure controlled. She is pre-menopausal. We discussed the importance of contraception to prevent pregnancy as she is on an ACE inhibitor. She understands that ACE inhibitors are teratogenic.   Uterine leiomyoma, unspecified location:  FU with Gyn to determine if this needs excision.   Shortness of breath:  I think this is related to deconditioning.  She is not tachycardic and O2 is normal.  She does not look volume overloaded on exam.  She  may have diastolic dysfunction in light of her HTN heart disease.  I will check a BMET, BNP, CBC.  Check Echo.  If diastolic dysfunction on Echo or elevated BNP, add diuretics.  TWI on ECG likely related to repolarization abnormality.  TWI are similar to prior ECGs.  As she recovers, if dyspnea continues, may need to consider stress testing.    Current medicines are reviewed at length with the patient today.  Concerns regarding medicines are as outlined above.  The following changes have been made:    None    Labs/ tests ordered today include:  Orders Placed This Encounter  Procedures  . Basic Metabolic Panel (BMET)  . CBC w/Diff  . B Nat Peptide  . EKG 12-Lead  . Echocardiogram    Disposition:   FU with Dr. Darlin Coco in July when she is back to see Dr. Servando Snare.    Signed, Nicki Reaper  Jorene Minors, MHS 12/06/2014 9:52 AM    [facility] [place], [place], [place]  [address] Phone: [phone]; Fax: [phone]

## 2014-12-05 NOTE — Telephone Encounter (Signed)
Follow Up       Pt called back stating to disregard previous message. She got our office confused with another office and she has now figured it all out. She has rescheduled her appt for tomorrow w/ Richardson Dopp.

## 2014-12-06 ENCOUNTER — Encounter: Payer: Self-pay | Admitting: Physician Assistant

## 2014-12-06 ENCOUNTER — Encounter: Payer: 59 | Admitting: Physician Assistant

## 2014-12-06 ENCOUNTER — Ambulatory Visit (INDEPENDENT_AMBULATORY_CARE_PROVIDER_SITE_OTHER): Payer: PRIVATE HEALTH INSURANCE | Admitting: Physician Assistant

## 2014-12-06 VITALS — BP 130/70 | HR 68 | Ht 66.0 in | Wt 205.0 lb

## 2014-12-06 DIAGNOSIS — D259 Leiomyoma of uterus, unspecified: Secondary | ICD-10-CM

## 2014-12-06 DIAGNOSIS — I7101 Dissection of thoracic aorta: Secondary | ICD-10-CM

## 2014-12-06 DIAGNOSIS — R0602 Shortness of breath: Secondary | ICD-10-CM

## 2014-12-06 DIAGNOSIS — I71019 Dissection of thoracic aorta, unspecified: Secondary | ICD-10-CM

## 2014-12-06 DIAGNOSIS — Z95828 Presence of other vascular implants and grafts: Secondary | ICD-10-CM | POA: Diagnosis not present

## 2014-12-06 DIAGNOSIS — I10 Essential (primary) hypertension: Secondary | ICD-10-CM

## 2014-12-06 LAB — CBC WITH DIFFERENTIAL/PLATELET
BASOS ABS: 0 10*3/uL (ref 0.0–0.1)
Basophils Relative: 0.3 % (ref 0.0–3.0)
EOS ABS: 0.1 10*3/uL (ref 0.0–0.7)
Eosinophils Relative: 1.3 % (ref 0.0–5.0)
HCT: 31.9 % — ABNORMAL LOW (ref 36.0–46.0)
Hemoglobin: 10.4 g/dL — ABNORMAL LOW (ref 12.0–15.0)
Lymphocytes Relative: 22.2 % (ref 12.0–46.0)
Lymphs Abs: 1.6 10*3/uL (ref 0.7–4.0)
MCHC: 32.5 g/dL (ref 30.0–36.0)
MCV: 84.7 fl (ref 78.0–100.0)
MONOS PCT: 10.6 % (ref 3.0–12.0)
Monocytes Absolute: 0.7 10*3/uL (ref 0.1–1.0)
Neutro Abs: 4.6 10*3/uL (ref 1.4–7.7)
Neutrophils Relative %: 65.6 % (ref 43.0–77.0)
Platelets: 429 10*3/uL — ABNORMAL HIGH (ref 150.0–400.0)
RBC: 3.77 Mil/uL — ABNORMAL LOW (ref 3.87–5.11)
RDW: 16.9 % — ABNORMAL HIGH (ref 11.5–15.5)
WBC: 7.1 10*3/uL (ref 4.0–10.5)

## 2014-12-06 LAB — BASIC METABOLIC PANEL
BUN: 9 mg/dL (ref 6–23)
CHLORIDE: 102 meq/L (ref 96–112)
CO2: 28 meq/L (ref 19–32)
CREATININE: 0.72 mg/dL (ref 0.40–1.20)
Calcium: 9.2 mg/dL (ref 8.4–10.5)
GFR: 110.7 mL/min (ref >60.00)
GLUCOSE: 92 mg/dL (ref 70–99)
Potassium: 3.7 mEq/L (ref 3.5–5.1)
Sodium: 136 mEq/L (ref 135–145)

## 2014-12-06 LAB — BRAIN NATRIURETIC PEPTIDE: Pro B Natriuretic peptide (BNP): 281 pg/mL — ABNORMAL HIGH (ref 0.0–100.0)

## 2014-12-06 MED ORDER — LISINOPRIL 40 MG PO TABS: 40.0000 mg | ORAL_TABLET | Freq: Every day | ORAL | Status: DC

## 2014-12-06 MED ORDER — CARVEDILOL 25 MG PO TABS: 25.0000 mg | ORAL_TABLET | Freq: Two times a day (BID) | ORAL | Status: DC

## 2014-12-06 MED ORDER — AMLODIPINE BESYLATE 10 MG PO TABS: 10.0000 mg | ORAL_TABLET | Freq: Every day | ORAL | Status: DC

## 2014-12-06 NOTE — Patient Instructions (Signed)
Medication Instructions:  Your physician recommends that you continue on your current medications as directed. Please refer to the Current Medication list given to you today.  REFILLS SENT IN FOR COREG, NORVASC, LISINOPRIL Labwork: TODAY BMET, CBC W/DIFF, BNP  Testing/Procedures: Your physician has requested that you have an echocardiogram. Echocardiography is a painless test that uses sound waves to create images of your heart. It provides your doctor with information about the size and shape of your heart and how well your heart's chambers and valves are working. This procedure takes approximately one hour. There are no restrictions for this procedure.   Follow-Up: FOLLOW UP WITH DR. Mare Ferrari 01/29/15; THESE ARE SOME TIMES THAT DAY 10:30, 11:30, 1:30, 2:30, 3:30  Any Other Special Instructions Will Be Listed Below (If Applicable). PER SCOTT WEAVER, PAC OK TO START CARDIAC [place]

## 2014-12-07 ENCOUNTER — Telehealth: Payer: Self-pay | Admitting: *Deleted

## 2014-12-07 DIAGNOSIS — I10 Essential (primary) hypertension: Secondary | ICD-10-CM

## 2014-12-07 MED ORDER — POTASSIUM CHLORIDE ER 10 MEQ PO TBCR: 10.0000 meq | EXTENDED_RELEASE_TABLET | Freq: Every day | ORAL | Status: DC

## 2014-12-07 MED ORDER — FUROSEMIDE 20 MG PO TABS: 20.0000 mg | ORAL_TABLET | Freq: Every day | ORAL | Status: DC

## 2014-12-07 NOTE — Telephone Encounter (Signed)
Pt notified of lab results and med changes, repeat bmet.. Rx lasix 20 mg QD, K+ 10 meq QD sent in today, bmet 6/10 when she comes in for echo appt. Pt vebalized understanding to plan of care by phone

## 2014-12-10 ENCOUNTER — Ambulatory Visit: Payer: 59

## 2014-12-14 ENCOUNTER — Other Ambulatory Visit: Payer: Self-pay

## 2014-12-14 ENCOUNTER — Ambulatory Visit (HOSPITAL_COMMUNITY): Payer: Medicaid - Out of State | Attending: Internal Medicine

## 2014-12-14 ENCOUNTER — Telehealth: Payer: Self-pay | Admitting: *Deleted

## 2014-12-14 ENCOUNTER — Encounter: Payer: Self-pay | Admitting: Physician Assistant

## 2014-12-14 ENCOUNTER — Other Ambulatory Visit (INDEPENDENT_AMBULATORY_CARE_PROVIDER_SITE_OTHER): Payer: PRIVATE HEALTH INSURANCE | Admitting: *Deleted

## 2014-12-14 DIAGNOSIS — I10 Essential (primary) hypertension: Secondary | ICD-10-CM | POA: Diagnosis not present

## 2014-12-14 DIAGNOSIS — I253 Aneurysm of heart: Secondary | ICD-10-CM | POA: Insufficient documentation

## 2014-12-14 DIAGNOSIS — Z720 Tobacco use: Secondary | ICD-10-CM | POA: Diagnosis not present

## 2014-12-14 DIAGNOSIS — I71019 Dissection of thoracic aorta, unspecified: Secondary | ICD-10-CM

## 2014-12-14 DIAGNOSIS — Z95828 Presence of other vascular implants and grafts: Secondary | ICD-10-CM | POA: Diagnosis not present

## 2014-12-14 DIAGNOSIS — Z48812 Encounter for surgical aftercare following surgery on the circulatory system: Secondary | ICD-10-CM | POA: Diagnosis present

## 2014-12-14 DIAGNOSIS — I7101 Dissection of thoracic aorta: Secondary | ICD-10-CM | POA: Diagnosis not present

## 2014-12-14 LAB — BASIC METABOLIC PANEL
BUN: 8 mg/dL (ref 6–23)
CO2: 29 mEq/L (ref 19–32)
Calcium: 9.5 mg/dL (ref 8.4–10.5)
Chloride: 101 mEq/L (ref 96–112)
Creatinine, Ser: 0.82 mg/dL (ref 0.40–1.20)
GFR: 95.27 mL/min (ref >60.00)
Glucose, Bld: 94 mg/dL (ref 70–99)
Potassium: 3.9 mEq/L (ref 3.5–5.1)
SODIUM: 137 meq/L (ref 135–145)

## 2014-12-14 NOTE — Telephone Encounter (Signed)
S/w pt on [phone] # she has been advised of lab and echo results by phone with verbal understanding to results given. Pt said thank you so much for our help.

## 2015-01-24 ENCOUNTER — Ambulatory Visit: Payer: Self-pay | Admitting: Cardiothoracic Surgery

## 2015-01-29 ENCOUNTER — Ambulatory Visit: Payer: 59 | Admitting: Cardiology

## 2016-01-11 ENCOUNTER — Other Ambulatory Visit: Payer: Self-pay | Admitting: Physician Assistant

## 2016-06-21 IMAGING — DX DG ABDOMEN ACUTE W/ 1V CHEST
3 series · 3 of 3 positions shown · non-contrast
Comparison: None.

CLINICAL DATA: Generalized abdominal pain.  Diarrhea.

EXAM:
DG ABDOMEN ACUTE W/ 1V CHEST

[chest pa]
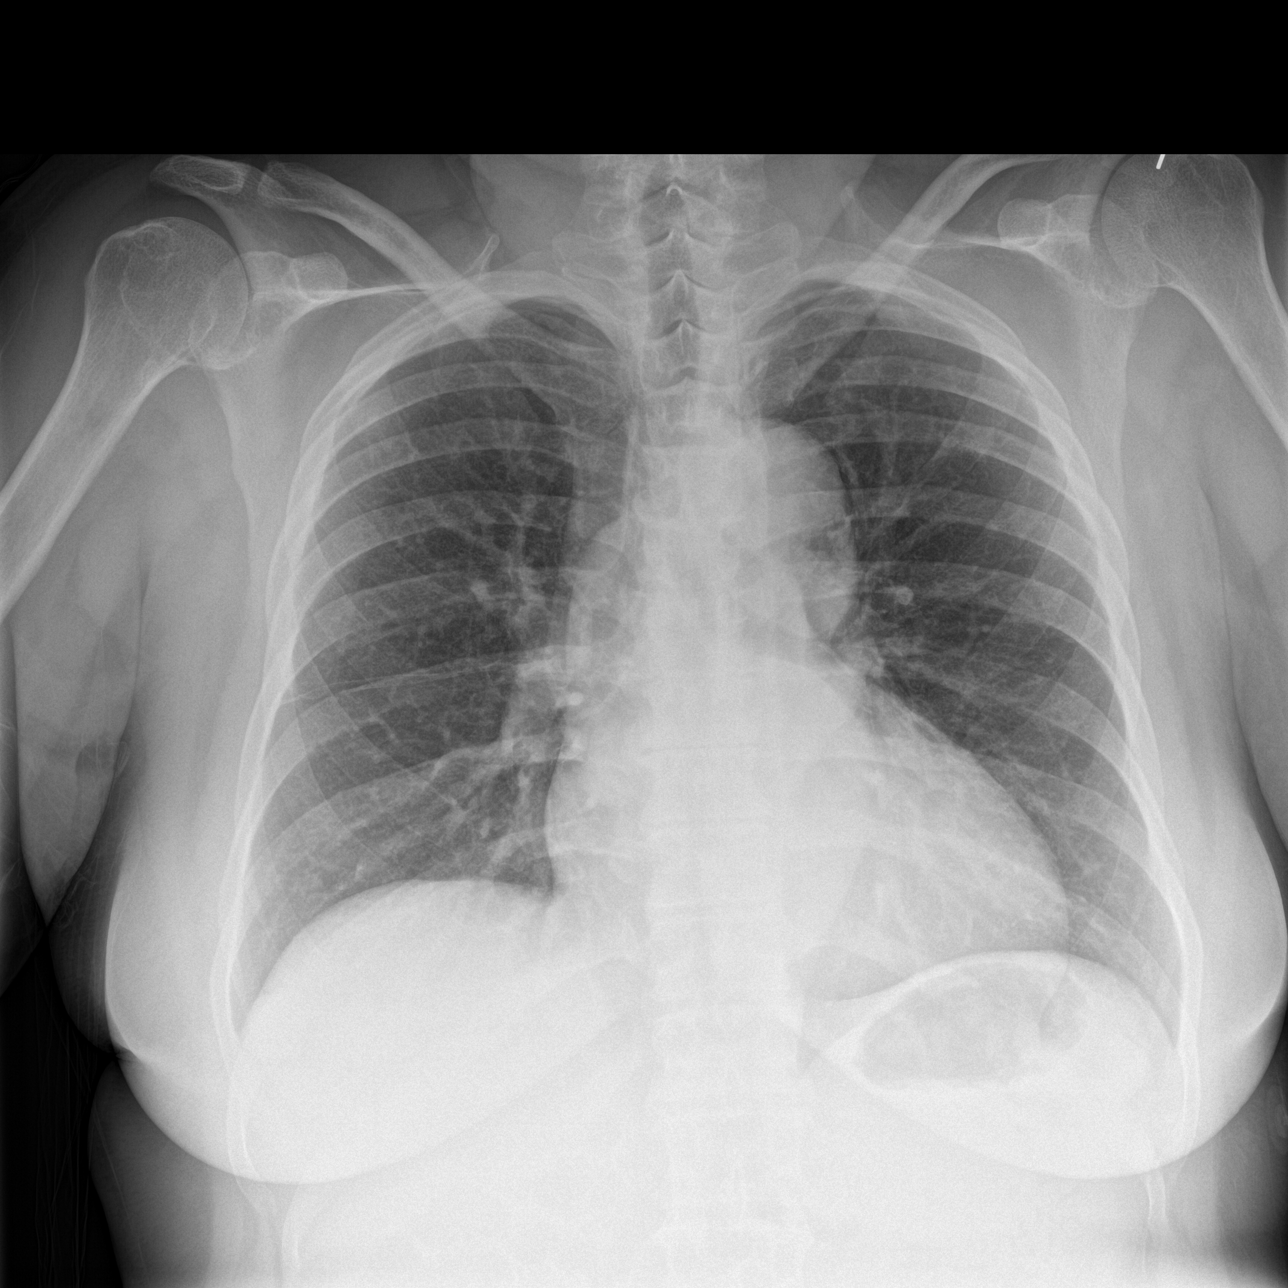

[abdomen erect]
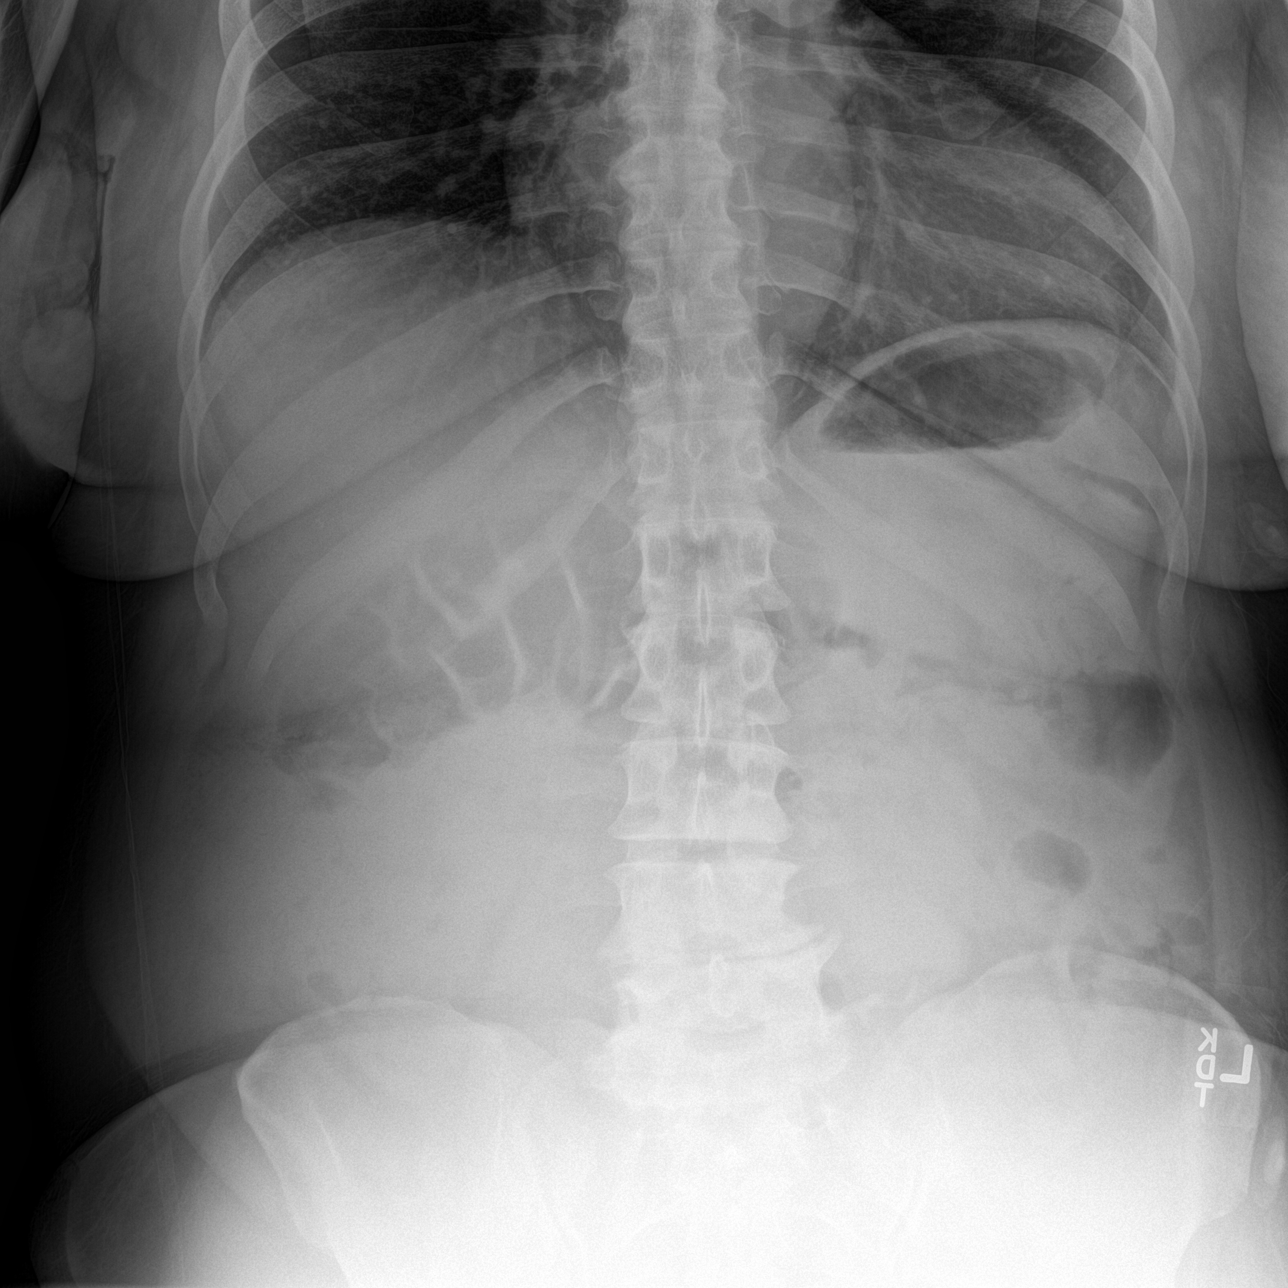

[abdomen supine]
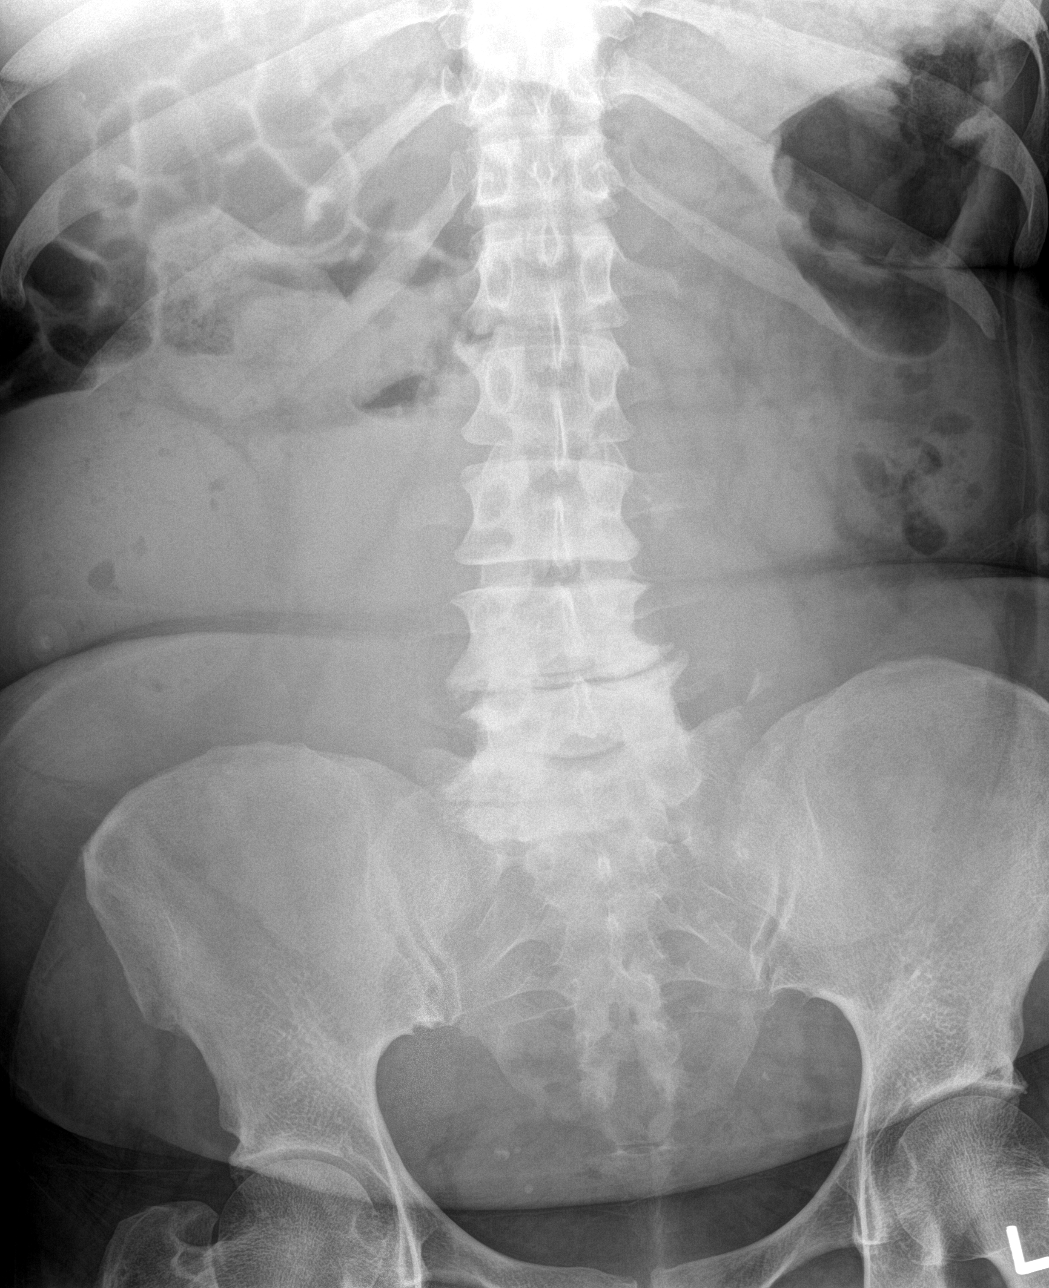

[3 of 3 positions shown; findings below may reference images not displayed]

FINDINGS: There is no evidence of dilated bowel loops or free intraperitoneal
air. No radiopaque calculi identified. Lower lumbar spine
degenerative changes noted.

Heart size and mediastinal contours are within normal limits. Mild
tortuosity of thoracic aorta is noted. Both lungs are clear. No
evidence of pneumothorax or pleural effusion.
IMPRESSION: Normal bowel gas pattern.  No active cardiopulmonary disease.

## 2016-06-21 IMAGING — CT CT ABD-PELV W/ CM
2 of 9 series · 12 of 46 positions shown, 15 images · IV contrast (APPLIED)
Comparison: None.

CLINICAL DATA: Abdominal pain.

EXAM:
CT ABDOMEN AND PELVIS WITH CONTRAST
TECHNIQUE: Multidetector CT imaging of the abdomen and pelvis was performed
using the standard protocol following bolus administration of
intravenous contrast.
CONTRAST:  100mL OMNIPAQUE IOHEXOL 300 MG/ML  SOLN

[Series 3: abd/ pelvis 5.0 i30f 1 · axial · 0.79mm/px · z∈[-640,-255]mm · 10 of 94 slices shown, 13 images]
[im 11/94  soft-tissue]
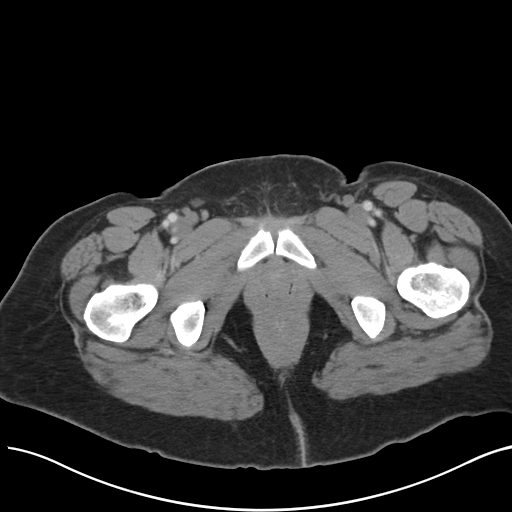
[im 11/94  bone]
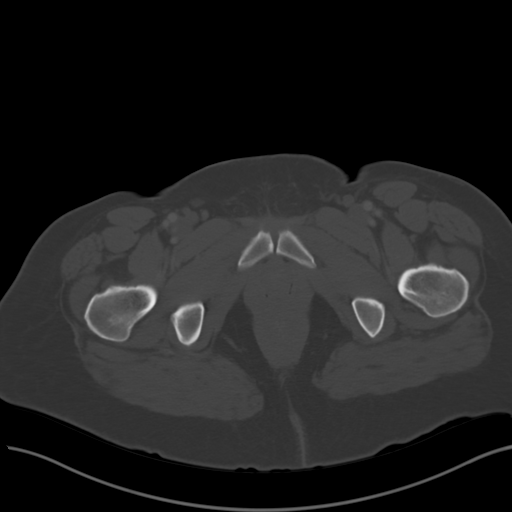
[im 21/94  soft-tissue]
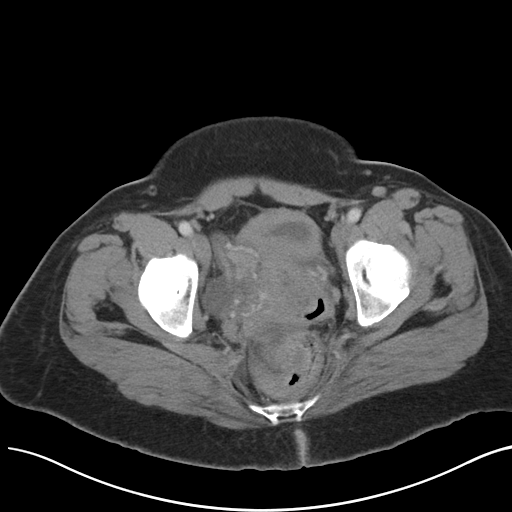
[im 32/94  soft-tissue]
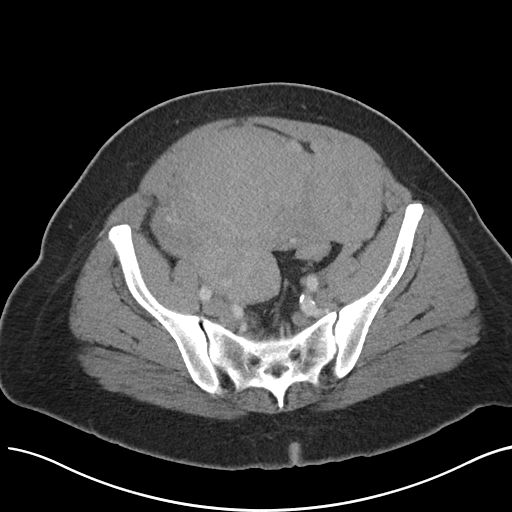
[im 42/94  soft-tissue]
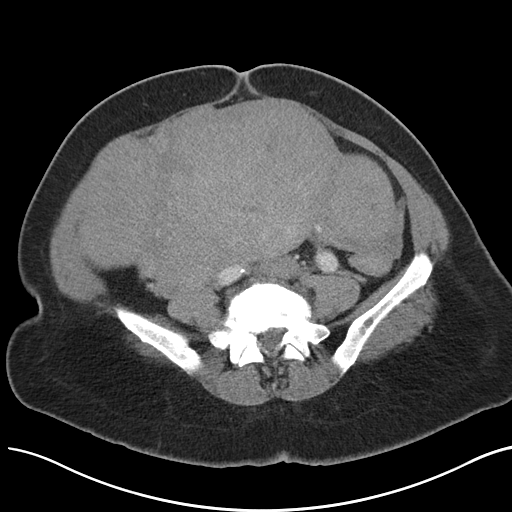
[im 52/94  soft-tissue]
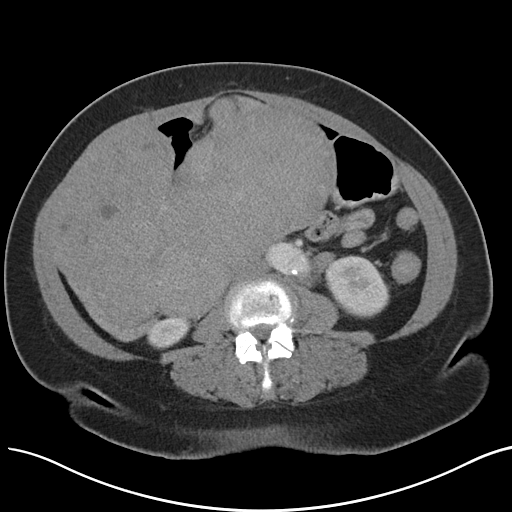
[im 63/94  soft-tissue]
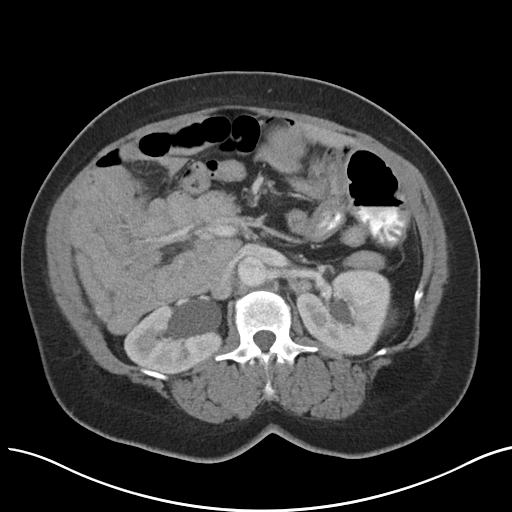
[im 73/94  soft-tissue]
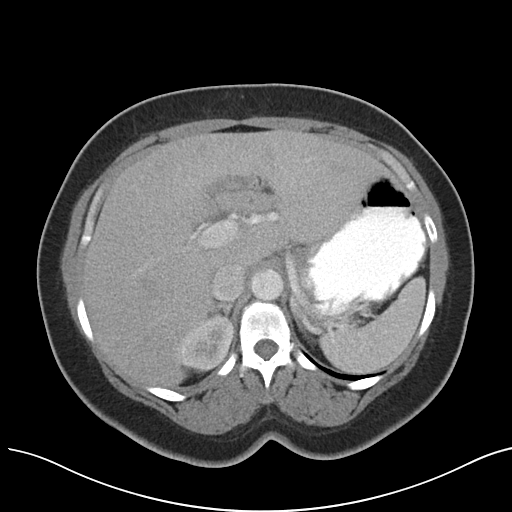
[im 73/94  lung]
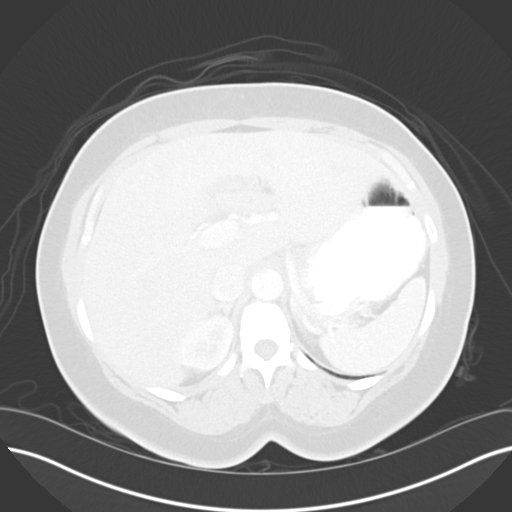
[im 78/94  lung]
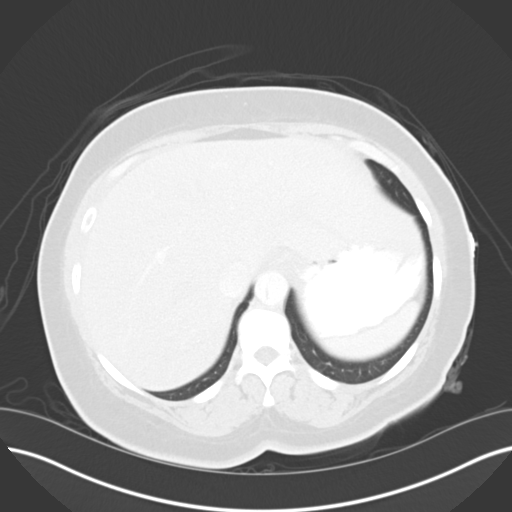
[im 83/94  soft-tissue]
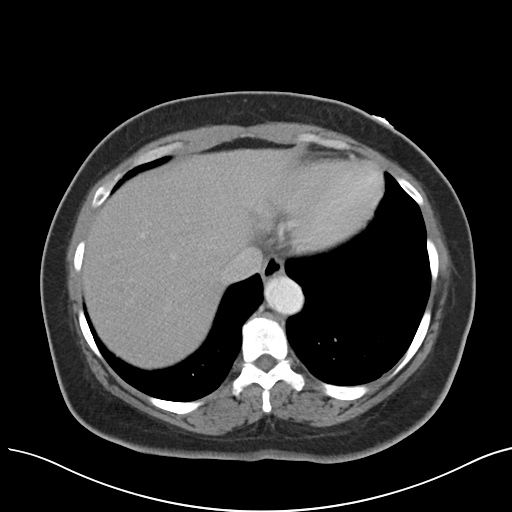
[im 83/94  lung]
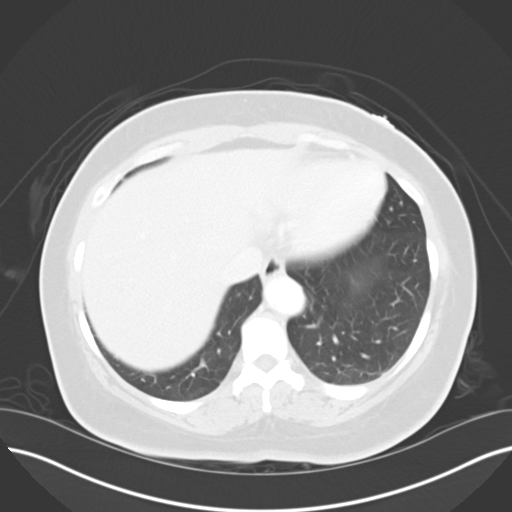
[im 88/94  lung]
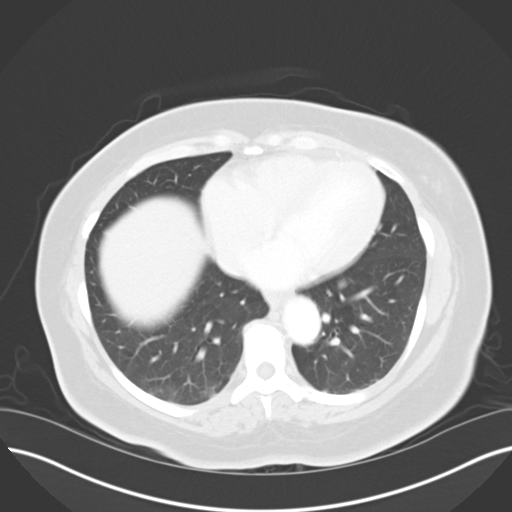

[Series 9: delays 2.0 mpr cor · coronal · 0.32mm/px · 2 of 159 slices shown]
[im 53/159  soft-tissue]
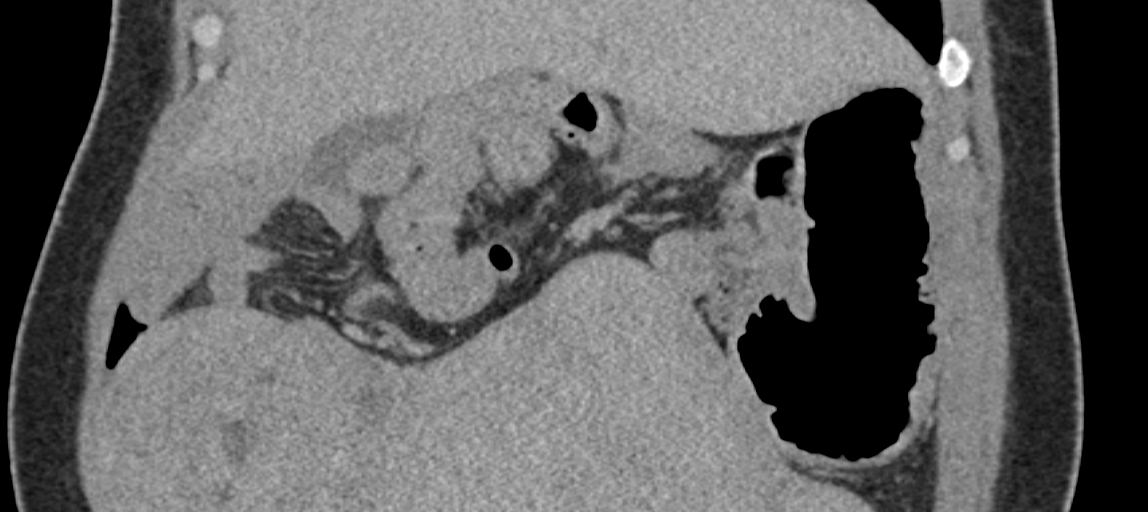
[im 106/159  soft-tissue]
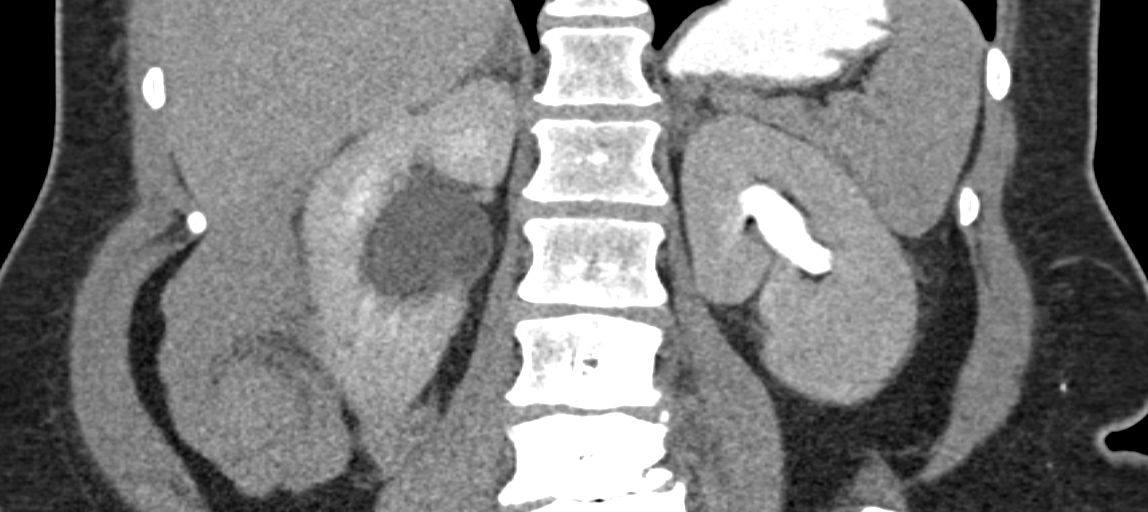

[12 of 46 positions shown; findings below may reference images not displayed]

FINDINGS: Lower chest: The lung bases are unremarkable.  Heart size is normal.

Upper abdomen: No focal abnormality identified within the liver,
spleen, pancreas, or adrenal glands. There are extrarenal pelves
bilaterally. There is slight delay in enhancement of the right
kidney, possibly related to delayed arterial inflow. See below.

Gastrointestinal tract: The stomach and small bowel loops are
nondilated the compressed primarily within the right upper quadrant.
Colonic loops are normal in appearance. The appendix is not well
seen but may be obscured or absent.

Pelvis: Extending from the pelvis into the abdomen there is a large
heterogeneous solid mass extending out superior portion of the
uterus and measuring at least 27 x 27 x 13.4 cm. The urinary bladder
is decompressed but has a thickened wall. No definite ovarian mass
identified. There is no free pelvic fluid.

Retroperitoneum: There is an abdominal aortic dissection which
begins at the upper most cuts of the study and extends into the left
iliac artery. Dissection flap extends into the celiac axis and
superior mesenteric artery. Is difficult to assess the integrity of
the renal arteries given the contrast bolus which was performed for
general abdominal imaging. Contrast is identified within the
inferior mesenteric artery.

Abdominal wall: Unremarkable.

Osseous structures: There are moderate degenerative changes in the
lower thoracic and lower lumbar spine. No suspicious lytic or
blastic lesions are identified.
IMPRESSION: 1. Abdominal aortic dissection extending into the celiac axis,
superior mesenteric artery, and left iliac artery. Because of the
timing of the contrast bolus, is difficult to evaluate integrity of
the abdominal vessels.
2. Consider CT angiography of the chest and abdomen for further
characterization.
3. There is probable delayed nephrogram right kidney, raising
suspicion for vascular compromise right renal artery.
4. Large pelvic mass extending from the fundus of the uterus
consistent with large uterine fibroids, measuring at least 27 cm.
5. Critical Value/emergent results were called by telephone at the
time of interpretation on 11/03/2014 at [DATE] to Dr. DERRICO
ISIMAIL , who verbally acknowledged these results.

## 2018-07-04 ENCOUNTER — Ambulatory Visit
Admission: EM | Admit: 2018-07-04 | Discharge: 2018-07-04 | Disposition: A | Discharge status: Home / Self Care | Payer: 59

## 2018-07-04 DIAGNOSIS — I10 Essential (primary) hypertension: Secondary | ICD-10-CM | POA: Insufficient documentation

## 2018-07-04 DIAGNOSIS — Z760 Encounter for issue of repeat prescription: Secondary | ICD-10-CM | POA: Insufficient documentation

## 2018-07-04 DIAGNOSIS — F17210 Nicotine dependence, cigarettes, uncomplicated: Secondary | ICD-10-CM

## 2018-07-04 DIAGNOSIS — Z79899 Other long term (current) drug therapy: Secondary | ICD-10-CM

## 2018-07-04 MED ORDER — FUROSEMIDE 20 MG PO TABS: 20.0000 mg | ORAL_TABLET | Freq: Every day | ORAL | 1 refills | Status: DC

## 2018-07-04 MED ORDER — ERGOCALCIFEROL 1.25 MG (50000 UT) PO CAPS: 50000.0000 [IU] | ORAL_CAPSULE | ORAL | 0 refills | Status: DC

## 2018-07-04 MED ORDER — LISINOPRIL 40 MG PO TABS: 40.0000 mg | ORAL_TABLET | Freq: Every day | ORAL | 1 refills | Status: DC

## 2018-07-04 MED ORDER — POTASSIUM CHLORIDE ER 10 MEQ PO TBCR: 10.0000 meq | EXTENDED_RELEASE_TABLET | Freq: Every day | ORAL | 11 refills | Status: DC

## 2018-07-04 MED ORDER — SIMVASTATIN 20 MG PO TABS: 20.0000 mg | ORAL_TABLET | Freq: Every day | ORAL | 1 refills | Status: DC

## 2018-07-04 MED ORDER — CARVEDILOL 25 MG PO TABS: 25.0000 mg | ORAL_TABLET | Freq: Two times a day (BID) | ORAL | 1 refills | Status: DC

## 2018-07-04 MED ORDER — AMLODIPINE BESYLATE 10 MG PO TABS: 10.0000 mg | ORAL_TABLET | Freq: Every day | ORAL | 1 refills | Status: DC

## 2018-07-04 NOTE — Discharge Instructions (Signed)
I refilled your prescriptions and printed out the prescriptions You can go on good [email or web address] and find the cheapest places for your prescriptions We are also giving you a good Rx card and a contact for primary care

## 2018-07-04 NOTE — ED Provider Notes (Signed)
[facility]    CSN: [id number] Arrival date & time: 07/04/18  1841     History   Chief Complaint Chief Complaint  Patient presents with  . need meds refill    HPI Ashley Guerrero is a 52 y.o. female.   Pt is a 52 year old female with PMH of aortic dissection, CHF, hypertension, high cholesterol. She is here for medication refill. She recently relocated from [place] and is here permanently now to take care or her dad. She does not have a primary care yet. She has been taking all her medications and has not missed any doses. She denies any associated chest pain, SOB, palpitation. No abdominal pain, dizziness, vision problems.   ROS per HPI       Past Medical History:  Diagnosis Date  . Aortic dissection ([place])    Status post repair 5/16  . Chronic diastolic CHF (congestive heart failure) ([place])    Echo 6/16:  Severe LVH, EF 60-65%, no RWMA, Gr 1 DD, LA upper limits of normal, mild RAE, aneurysmal intra-atrial septum  . Fibroid uterus   . Hypertension     Patient Active Problem List   Diagnosis Date Noted  . S/P ascending aortic replacement 11/04/2014  . Essential hypertension, malignant 11/04/2014  . Acute thoracic aortic dissection Type I 11/04/2014  . Large  Abdominal mass- question uterine origin  11/04/2014    Past Surgical History:  Procedure Laterality Date  . REPLACEMENT ASCENDING AORTA N/A 11/03/2014   Procedure: REPLACEMENT ASCENDING AORTA with a 76mm hemashield graft,  resuspension of aortic valve, hypothermic circulatory arrest and cardiopulmonary bypass.;  Surgeon: [name], MD;  Location: [facility];  Service: Open Heart Surgery;  Laterality: N/A;    OB History   No obstetric history on file.      Home Medications    Prior to Admission medications   Medication Sig Start Date End Date Taking? Authorizing Provider  amLODipine (NORVASC) 10 MG tablet Take 1 tablet (10 mg total) by mouth daily. 07/04/18   Loura Halt A, NP  aspirin  EC 325 MG EC tablet Take 1 tablet (325 mg total) by mouth daily. 11/12/14   Gold, Wayne E, PA-C  carvedilol (COREG) 25 MG tablet Take 1 tablet (25 mg total) by mouth 2 (two) times daily with a meal. 07/04/18   Kaidyn Javid A, NP  ergocalciferol (VITAMIN D2) 1.25 MG (50000 UT) capsule Take 1 capsule (50,000 Units total) by mouth once a week. 07/04/18   Loura Halt A, NP  furosemide (LASIX) 20 MG tablet Take 1 tablet (20 mg total) by mouth daily. 07/04/18   Loura Halt A, NP  lisinopril (PRINIVIL,ZESTRIL) 40 MG tablet Take 1 tablet (40 mg total) by mouth daily. 07/04/18   Loura Halt A, NP  potassium chloride (K-DUR) 10 MEQ tablet Take 1 tablet (10 mEq total) by mouth daily. 07/04/18   Loura Halt A, NP  simvastatin (ZOCOR) 20 MG tablet Take 1 tablet (20 mg total) by mouth daily. 07/04/18   Orvan July, NP    Family History Family History  Problem Relation Age of Onset  . Hypertension Mother   . Cancer Mother   . Stroke Father   . Heart failure Father   . Hypertension Father   . Diabetes Father   . Hyperlipidemia Father   . Hypertension Sister   . Hypertension Brother   . Cancer Sister   . Heart attack Neg Hx     Social History Social  History   Tobacco Use  . Smoking status: Current Every Day Smoker    Packs/day: 1.00    Types: Cigarettes    Start date: 11/03/2014  . Smokeless tobacco: Never Used  Substance Use Topics  . Alcohol use: Yes    Alcohol/week: 0.0 standard drinks    Comment: occasional  . Drug use: Yes    Types: Marijuana     Allergies   Patient has no known allergies.   Review of Systems Review of Systems   Physical Exam Triage Vital Signs ED Triage Vitals [07/04/18 1853]  Enc Vitals Group     BP (!) 156/76     Pulse Rate 60     Resp 16     Temp 97.9 F (36.6 C)     Temp Source Oral     SpO2 96 %     Weight      Height      Head Circumference      Peak Flow      Pain Score 0     Pain Loc      Pain Edu?      Excl. in [place]?    No data  found.  Updated Vital Signs BP (!) 156/76 (BP Location: Right Arm)   Pulse 60   Temp 97.9 F (36.6 C) (Oral)   Resp 16   SpO2 96%   Visual Acuity Right Eye Distance:   Left Eye Distance:   Bilateral Distance:    Right Eye Near:   Left Eye Near:    Bilateral Near:     Physical Exam Vitals signs and nursing note reviewed.  Constitutional:      Appearance: Normal appearance. She is not ill-appearing or toxic-appearing.  HENT:     Head: Normocephalic and atraumatic.     Nose: Nose normal.     Mouth/Throat:     Mouth: Mucous membranes are moist.  Eyes:     Conjunctiva/sclera: Conjunctivae normal.  Neck:     Musculoskeletal: Normal range of motion.  Cardiovascular:     Rate and Rhythm: Normal rate and regular rhythm.     Pulses: Normal pulses.     Heart sounds: Normal heart sounds.  Pulmonary:     Effort: Pulmonary effort is normal.     Breath sounds: Normal breath sounds.  Musculoskeletal: Normal range of motion.  Skin:    General: Skin is warm and dry.  Neurological:     Mental Status: She is alert.  Psychiatric:        Mood and Affect: Mood normal.      UC Treatments / Results  Labs (all labs ordered are listed, but only abnormal results are displayed) Labs Reviewed - No data to display  EKG None  Radiology No results found.  Procedures Procedures (including critical care time)  Medications Ordered in UC Medications - No data to display  Initial Impression / Assessment and Plan / UC Course  I have reviewed the triage vital signs and the nursing notes.  Pertinent labs & imaging results that were available during my care of the patient were reviewed by me and considered in my medical decision making (see chart for details).     Refilled patient's medications Contact given for primary care at [facility] square.    Final Clinical Impressions(s) / UC Diagnoses   Final diagnoses:  Medication refill     Discharge Instructions     I refilled  your prescriptions and printed out the prescriptions You can  go on good [email or web address] and find the cheapest places for your prescriptions We are also giving you a good Rx card and a contact for primary care    ED Prescriptions    Medication Sig Dispense Auth. Provider   amLODipine (NORVASC) 10 MG tablet Take 1 tablet (10 mg total) by mouth daily. 30 tablet Reni Hausner A, NP   carvedilol (COREG) 25 MG tablet Take 1 tablet (25 mg total) by mouth 2 (two) times daily with a meal. 60 tablet Kristapher Dubuque A, NP   ergocalciferol (VITAMIN D2) 1.25 MG (50000 UT) capsule Take 1 capsule (50,000 Units total) by mouth once a week. 12 capsule Keontae Levingston A, NP   furosemide (LASIX) 20 MG tablet Take 1 tablet (20 mg total) by mouth daily. 30 tablet Ellice Boultinghouse A, NP   lisinopril (PRINIVIL,ZESTRIL) 40 MG tablet  (Status: Discontinued) Take 1 tablet (40 mg total) by mouth daily. 30 tablet Zaiyden Strozier A, NP   potassium chloride (K-DUR) 10 MEQ tablet  (Status: Discontinued) Take 1 tablet (10 mEq total) by mouth daily. 30 tablet Tandy Lewin A, NP   simvastatin (ZOCOR) 20 MG tablet  (Status: Discontinued) Take 1 tablet (20 mg total) by mouth daily. 30 tablet Josanne Boerema A, NP   lisinopril (PRINIVIL,ZESTRIL) 40 MG tablet Take 1 tablet (40 mg total) by mouth daily. 30 tablet Ardyn Forge A, NP   potassium chloride (K-DUR) 10 MEQ tablet Take 1 tablet (10 mEq total) by mouth daily. 30 tablet Nesanel Aguila A, NP   simvastatin (ZOCOR) 20 MG tablet Take 1 tablet (20 mg total) by mouth daily. 30 tablet Loura Halt A, NP     Controlled Substance Prescriptions [place] Controlled Substance Registry consulted? Not Applicable   Orvan July, NP 07/04/18 1925

## 2018-07-04 NOTE — ED Triage Notes (Signed)
Pt states just relocated here and has no PCP, needs her medications refilled

## 2018-09-20 ENCOUNTER — Telehealth: Payer: Self-pay | Admitting: Family Medicine

## 2018-09-20 ENCOUNTER — Encounter: Payer: Self-pay | Admitting: Family Medicine

## 2018-09-20 ENCOUNTER — Other Ambulatory Visit: Payer: Self-pay

## 2018-09-20 ENCOUNTER — Ambulatory Visit (INDEPENDENT_AMBULATORY_CARE_PROVIDER_SITE_OTHER): Payer: Self-pay | Admitting: Family Medicine

## 2018-09-20 VITALS — BP 116/76 | HR 56 | Temp 97.9°F | Resp 17 | Ht 66.0 in | Wt 209.8 lb

## 2018-09-20 DIAGNOSIS — Z1239 Encounter for other screening for malignant neoplasm of breast: Secondary | ICD-10-CM

## 2018-09-20 DIAGNOSIS — Z131 Encounter for screening for diabetes mellitus: Secondary | ICD-10-CM

## 2018-09-20 DIAGNOSIS — Z7689 Persons encountering health services in other specified circumstances: Secondary | ICD-10-CM

## 2018-09-20 DIAGNOSIS — Z1389 Encounter for screening for other disorder: Secondary | ICD-10-CM

## 2018-09-20 DIAGNOSIS — I10 Essential (primary) hypertension: Secondary | ICD-10-CM

## 2018-09-20 DIAGNOSIS — Z95828 Presence of other vascular implants and grafts: Secondary | ICD-10-CM

## 2018-09-20 DIAGNOSIS — Z8639 Personal history of other endocrine, nutritional and metabolic disease: Secondary | ICD-10-CM

## 2018-09-20 DIAGNOSIS — E785 Hyperlipidemia, unspecified: Secondary | ICD-10-CM

## 2018-09-20 DIAGNOSIS — Z1329 Encounter for screening for other suspected endocrine disorder: Secondary | ICD-10-CM

## 2018-09-20 LAB — POCT URINALYSIS DIP (CLINITEK)
Bilirubin, UA: NEGATIVE
Glucose, UA: NEGATIVE mg/dL
Ketones, POC UA: NEGATIVE mg/dL
Leukocytes, UA: NEGATIVE
Nitrite, UA: NEGATIVE
POC PROTEIN,UA: NEGATIVE
Spec Grav, UA: 1.02 (ref 1.010–1.025)
Urobilinogen, UA: 0.2 E.U./dL
pH, UA: 5.5 (ref 5.0–8.0)

## 2018-09-20 MED ORDER — FUROSEMIDE 20 MG PO TABS: 20.0000 mg | ORAL_TABLET | Freq: Every day | ORAL | 2 refills | Status: DC

## 2018-09-20 MED ORDER — CARVEDILOL 25 MG PO TABS: 25.0000 mg | ORAL_TABLET | Freq: Two times a day (BID) | ORAL | 3 refills | Status: DC

## 2018-09-20 MED ORDER — CARVEDILOL 25 MG PO TABS: 25.0000 mg | ORAL_TABLET | Freq: Two times a day (BID) | ORAL | 2 refills | Status: DC

## 2018-09-20 MED ORDER — LISINOPRIL 40 MG PO TABS: 40.0000 mg | ORAL_TABLET | Freq: Every day | ORAL | 3 refills | Status: DC

## 2018-09-20 MED ORDER — FUROSEMIDE 20 MG PO TABS: 20.0000 mg | ORAL_TABLET | Freq: Every day | ORAL | 3 refills | Status: DC

## 2018-09-20 MED ORDER — SIMVASTATIN 20 MG PO TABS: 20.0000 mg | ORAL_TABLET | Freq: Every day | ORAL | 3 refills | Status: DC

## 2018-09-20 MED ORDER — POTASSIUM CHLORIDE ER 10 MEQ PO TBCR: 10.0000 meq | EXTENDED_RELEASE_TABLET | Freq: Every day | ORAL | 2 refills | Status: DC

## 2018-09-20 MED ORDER — LISINOPRIL 40 MG PO TABS: 40.0000 mg | ORAL_TABLET | Freq: Every day | ORAL | 2 refills | Status: DC

## 2018-09-20 MED ORDER — POTASSIUM CHLORIDE ER 10 MEQ PO TBCR: 10.0000 meq | EXTENDED_RELEASE_TABLET | Freq: Every day | ORAL | 3 refills | Status: DC

## 2018-09-20 MED ORDER — AMLODIPINE BESYLATE 10 MG PO TABS: 10.0000 mg | ORAL_TABLET | Freq: Every day | ORAL | 3 refills | Status: DC

## 2018-09-20 MED ORDER — SIMVASTATIN 20 MG PO TABS: 20.0000 mg | ORAL_TABLET | Freq: Every day | ORAL | 2 refills | Status: DC

## 2018-09-20 MED ORDER — AMLODIPINE BESYLATE 10 MG PO TABS: 10.0000 mg | ORAL_TABLET | Freq: Every day | ORAL | 2 refills | Status: DC

## 2018-09-20 NOTE — Patient Instructions (Addendum)
Thank you for choosing Primary Care at [facility] to be your medical home!    Ashley Guerrero was seen by Molli Barrows, FNP today.   Ashley Guerrero's primary care provider is Scot Jun, FNP.   For the best care possible, you should try to see Molli Barrows, FNP-C whenever you come to the clinic.   We look forward to seeing you again soon!  If you have any questions about your visit today, please call us at [phone] or feel free to reach your primary care provider via [place].     To schedule your breast exam, please call Breast Clinic at   Phone: [phone]     Hypertension Hypertension is another name for high blood pressure. High blood pressure forces your heart to work harder to pump blood. This can cause problems over time. There are two numbers in a blood pressure reading. There is a top number (systolic) over a bottom number (diastolic). It is best to have a blood pressure below 120/80. Healthy choices can help lower your blood pressure. You may need medicine to help lower your blood pressure if:  Your blood pressure cannot be lowered with healthy choices.  Your blood pressure is higher than 130/80. Follow these instructions at home: Eating and drinking   If directed, follow the DASH eating plan. This diet includes: ? Filling half of your plate at each meal with fruits and vegetables. ? Filling one quarter of your plate at each meal with whole grains. Whole grains include whole wheat pasta, brown rice, and whole grain bread. ? Eating or drinking low-fat dairy products, such as skim milk or low-fat yogurt. ? Filling one quarter of your plate at each meal with low-fat (lean) proteins. Low-fat proteins include fish, skinless chicken, eggs, beans, and tofu. ? Avoiding fatty meat, cured and processed meat, or chicken with skin. ? Avoiding premade or processed food.  Eat less than 1,500 mg of salt (sodium) a day.  Limit alcohol use to no more than 1  drink a day for nonpregnant women and 2 drinks a day for men. One drink equals 12 oz of beer, 5 oz of wine, or 1 oz of hard liquor. Lifestyle  Work with your doctor to stay at a healthy weight or to lose weight. Ask your doctor what the best weight is for you.  Get at least 30 minutes of exercise that causes your heart to beat faster (aerobic exercise) most days of the week. This may include walking, swimming, or biking.  Get at least 30 minutes of exercise that strengthens your muscles (resistance exercise) at least 3 days a week. This may include lifting weights or pilates.  Do not use any products that contain nicotine or tobacco. This includes cigarettes and e-cigarettes. If you need help quitting, ask your doctor.  Check your blood pressure at home as told by your doctor.  Keep all follow-up visits as told by your doctor. This is important. Medicines  Take over-the-counter and prescription medicines only as told by your doctor. Follow directions carefully.  Do not skip doses of blood pressure medicine. The medicine does not work as well if you skip doses. Skipping doses also puts you at risk for problems.  Ask your doctor about side effects or reactions to medicines that you should watch for. Contact a doctor if:  You think you are having a reaction to the medicine you are taking.  You have headaches that keep coming back (recurring).  You feel dizzy.  You have swelling in your ankles.  You have trouble with your vision. Get help right away if:  You get a very bad headache.  You start to feel confused.  You feel weak or numb.  You feel faint.  You get very bad pain in your: ? Chest. ? Belly (abdomen).  You throw up (vomit) more than once.  You have trouble breathing. Summary  Hypertension is another name for high blood pressure.  Making healthy choices can help lower blood pressure. If your blood pressure cannot be controlled with healthy choices, you may  need to take medicine. This information is not intended to replace advice given to you by your health care provider. Make sure you discuss any questions you have with your health care provider. Document Released: 12/09/2007 Document Revised: 05/20/2016 Document Reviewed: 05/20/2016 Elsevier Interactive Patient Education  2019 [employer].

## 2018-09-20 NOTE — Telephone Encounter (Signed)
Please advise 

## 2018-09-20 NOTE — Progress Notes (Signed)
Ashley Guerrero, is a 53 y.o. female  CBJ:[id number]  HYW:[id number]  DOB - Oct 11, 1965  CC:  Chief Complaint  Patient presents with  . Establish Care  . Hypertension  . Hyperlipidemia       HPI: Ashley Guerrero is a 53 y.o. female is here today to establish care. Recently moved to [facility] from [place] to care for parent. Medical history significant for S/P ascending aortic replacement; Essential hypertension, malignant; Acute thoracic aortic dissection Type I; and Large  Abdominal mass- question uterine origin on their problem list.   Patient was visiting [place] in 2016 when she parents and aortic dissection. She underwent an ascending aortic replacement. She was followed by cardiothoracic surgery and CHF she heart care for a brief period of time until she returned back to her home in [place].  She reports her last cardiology follow-up was approximately a year ago prior to her moving to [place]. She has not had any issues with chest pain or blood pressure control since that time.  She reports she is remained compliant with medication and has received a refill since living in [place] from urgent cares he has not had a primary care provider due to loss of Medicaid. Referred to our office to establish care and for financial assistance.  Blood pressure is stable today.  Patient denies any swelling, dizziness or headaches. She is active of routine physical activity and reports efforts to lose weight. Current Body mass index is 33.86 kg/m. She endorses efforts to adhere to a low-sodium diet.  She checks her blood pressure at home and readings have been consistently systolic 1 48-5 46E and diastolics 70-35. Patient has a first-degree family history of breast cancer.  She reports her mother and grandmother both had breast cancer. However subsequently her mother recovered from breast cancer and died 78 years later of brain cancer. Patient reports she has had normal mammograms annually. Due for  mammogram this year. Last mammogram was very February 2019, which was normal.  Current medications: Current Outpatient Medications:  .  amLODipine (NORVASC) 10 MG tablet, Take 1 tablet (10 mg total) by mouth daily., Disp: 30 tablet, Rfl: 2 .  aspirin EC 325 MG EC tablet, Take 1 tablet (325 mg total) by mouth daily., Disp: , Rfl:  .  carvedilol (COREG) 25 MG tablet, Take 1 tablet (25 mg total) by mouth 2 (two) times daily with a meal., Disp: 60 tablet, Rfl: 2 .  furosemide (LASIX) 20 MG tablet, Take 1 tablet (20 mg total) by mouth daily., Disp: 30 tablet, Rfl: 2 .  lisinopril (PRINIVIL,ZESTRIL) 40 MG tablet, Take 1 tablet (40 mg total) by mouth daily., Disp: 30 tablet, Rfl: 2 .  potassium chloride (K-DUR) 10 MEQ tablet, Take 1 tablet (10 mEq total) by mouth daily., Disp: 30 tablet, Rfl: 2 .  simvastatin (ZOCOR) 20 MG tablet, Take 1 tablet (20 mg total) by mouth daily., Disp: 30 tablet, Rfl: 2   Pertinent family medical history: family history includes Cancer in her mother and sister; Diabetes in her father; Heart failure in her father; Hyperlipidemia in her father; Hypertension in her brother, father, mother, and sister; Stroke in her father.   No Known Allergies  Social History   Socioeconomic History  . Marital status: Single    Spouse name: Not on file  . Number of children: Not on file  . Years of education: Not on file  . Highest education level: Not on file  Occupational History  . Not  on file  Social Needs  . Financial resource strain: Not on file  . Food insecurity:    Worry: Not on file    Inability: Not on file  . Transportation needs:    Medical: Not on file    Non-medical: Not on file  Tobacco Use  . Smoking status: Current Every Day Smoker    Packs/day: 1.00    Types: Cigarettes    Start date: 11/03/2014  . Smokeless tobacco: Never Used  Substance and Sexual Activity  . Alcohol use: Yes    Alcohol/week: 0.0 standard drinks    Comment: occasional  . Drug use: Yes     Types: Marijuana  . Sexual activity: Not Currently  Lifestyle  . Physical activity:    Days per week: Not on file    Minutes per session: Not on file  . Stress: Not on file  Relationships  . Social connections:    Talks on phone: Not on file    Gets together: Not on file    Attends religious service: Not on file    Active member of club or organization: Not on file    Attends meetings of clubs or organizations: Not on file    Relationship status: Not on file  . Intimate partner violence:    Fear of current or ex partner: Not on file    Emotionally abused: Not on file    Physically abused: Not on file    Forced sexual activity: Not on file  Other Topics Concern  . Not on file  Social History Narrative  . Not on file    Review of Systems: Pertinent negatives listed in HPI  Objective:   Vitals:   09/20/18 0902  BP: 116/76  Pulse: (!) 56  Resp: 17  Temp: 97.9 F (36.6 C)  SpO2: 94%    BP Readings from Last 3 Encounters:  09/20/18 116/76  07/04/18 (!) 156/76  12/06/14 130/70    Filed Weights   09/20/18 0902  Weight: 209 lb 12.8 oz (95.2 kg)      Physical Exam: Constitutional: Patient appears well-developed and well-nourished. No distress. HENT: Normocephalic, atraumatic, External right and left ear normal.  Eyes: Conjunctivae and EOM are normal. PERRLA, no scleral icterus. Neck: Normal ROM. Neck supple. No JVD. No tracheal deviation. No thyromegaly. CVS: RRR, S1/S2 +, no murmurs, no gallops, no carotid bruit.  Pulmonary: Effort and breath sounds normal, no stridor, rhonchi, wheezes, rales.  Abdominal: Soft. BS +, no distension, tenderness, rebound or guarding.  Musculoskeletal: Normal range of motion. No edema and no tenderness.  Neuro: Alert. Normal muscle tone coordination. Normal gait.  Skin: Skin is warm and dry. No rash noted. Not diaphoretic. No erythema. No pallor. Psychiatric: Normal mood and affect. Behavior, judgment, thought content  normal.  Lab Results (prior encounters)  Lab Results  Component Value Date   WBC 7.1 12/06/2014   HGB 10.4 (L) 12/06/2014   HCT 31.9 (L) 12/06/2014   MCV 84.7 12/06/2014   PLT 429.0 (H) 12/06/2014   Lab Results  Component Value Date   CREATININE 0.82 12/14/2014   BUN 8 12/14/2014   NA 137 12/14/2014   K 3.9 12/14/2014   CL 101 12/14/2014   CO2 29 12/14/2014   Assessment and plan:  1. Encounter to establish care  2. Screening for blood or protein in urine - POCT URINALYSIS DIP (CLINITEK) negative   3. Essential hypertension, malignant  Blood pressure normotensive today. Continue current medications.  Getting patient reestablish  with heart care for follow-up of history of thoracic aortic dissection. Home readings stable. Checking: - Comprehensive metabolic panel - CBC with Differential  4. Screening for diabetes mellitus, family history of DM - Hemoglobin A1c  5. Screening for thyroid disorder - Thyroid Panel With TSH  6. Hyperlipidemia, unspecified hyperlipidemia type Checking fasting lipid panel  7. Breast cancer screening - MM 3D SCREEN BREAST BILATERAL  8. H/O ascending aortic replacement - Ambulatory referral to Cardiology   Return in about 6 months (around 03/23/2019) for Hypertension follow-up.   The patient was given clear instructions to go to ER or return to medical center if symptoms don't improve, worsen or new problems develop. The patient verbalized understanding. The patient was advised  to call and obtain lab results if they haven't heard anything from out office within 7-10 business days.  Molli Barrows, FNP-C Primary Care at [facility] [address] [place] [address] [phone]: [phone]    This note has been created with Dragon speech recognition software and Engineer, materials. Any transcriptional errors are unintentional.

## 2018-09-20 NOTE — Addendum Note (Signed)
Addended by: Carylon Perches on: 09/20/2018 12:09 PM   Modules accepted: Orders

## 2018-09-20 NOTE — Telephone Encounter (Signed)
Patient called stating that she was not given refills on Vitamin D, patient stated that she addressed that she needed more with the provider and would like [facility] on [address]. Please follow up.

## 2018-09-20 NOTE — Telephone Encounter (Signed)
Patient would like test added on.

## 2018-09-20 NOTE — Telephone Encounter (Signed)
She may have mentioned to CMA, did not discuss any vitamin D nor did I check Vitamin D level with recent labs. She doesn't have vitamin D on her medication list today.  See when urgent care provider prescribed it back in December without any blood work.  She has the option of returning for lab visit today for a vitamin D level check or purchasing OTC vitamin D until her follow-up. For me to prescribed prescription dose of vitamin D prescription dose, I must have a vitamin D level.  If her vitamin D level is normal she would not require a prescription.

## 2018-09-21 LAB — COMPREHENSIVE METABOLIC PANEL
ALT: 16 IU/L (ref 0–32)
AST: 15 IU/L (ref 0–40)
Albumin/Globulin Ratio: 1.5 (ref 1.2–2.2)
Albumin: 4.3 g/dL (ref 3.8–4.9)
Alkaline Phosphatase: 97 IU/L (ref 39–117)
BUN/Creatinine Ratio: 11 (ref 9–23)
BUN: 10 mg/dL (ref 6–24)
Bilirubin Total: 0.3 mg/dL (ref 0.0–1.2)
CO2: 23 mmol/L (ref 20–29)
Calcium: 9.5 mg/dL (ref 8.7–10.2)
Chloride: 105 mmol/L (ref 96–106)
Creatinine, Ser: 0.87 mg/dL (ref 0.57–1.00)
GFR calc Af Amer: 89 mL/min/{1.73_m2} (ref >59)
GFR calc non Af Amer: 77 mL/min/{1.73_m2} (ref >59)
Globulin, Total: 2.8 g/dL (ref 1.5–4.5)
Glucose: 95 mg/dL (ref 65–99)
Potassium: 4.6 mmol/L (ref 3.5–5.2)
SODIUM: 142 mmol/L (ref 134–144)
Total Protein: 7.1 g/dL (ref 6.0–8.5)

## 2018-09-21 LAB — CBC WITH DIFFERENTIAL/PLATELET
Basophils Absolute: 0 10*3/uL (ref 0.0–0.2)
Basos: 1 %
EOS (ABSOLUTE): 0.2 10*3/uL (ref 0.0–0.4)
EOS: 4 %
Hematocrit: 40.6 % (ref 34.0–46.6)
Hemoglobin: 13.5 g/dL (ref 11.1–15.9)
Immature Grans (Abs): 0 10*3/uL (ref 0.0–0.1)
Immature Granulocytes: 0 %
Lymphocytes Absolute: 2.3 10*3/uL (ref 0.7–3.1)
Lymphs: 44 %
MCH: 31 pg (ref 26.6–33.0)
MCHC: 33.3 g/dL (ref 31.5–35.7)
MCV: 93 fL (ref 79–97)
MONOS ABS: 0.4 10*3/uL (ref 0.1–0.9)
Monocytes: 7 %
Neutrophils Absolute: 2.4 10*3/uL (ref 1.4–7.0)
Neutrophils: 44 %
Platelets: 286 10*3/uL (ref 150–450)
RBC: 4.35 x10E6/uL (ref 3.77–5.28)
RDW: 13.7 % (ref 11.7–15.4)
WBC: 5.3 10*3/uL (ref 3.4–10.8)

## 2018-09-21 LAB — VITAMIN D 25 HYDROXY (VIT D DEFICIENCY, FRACTURES): Vit D, 25-Hydroxy: 51.5 ng/mL (ref 30.0–100.0)

## 2018-09-21 LAB — LIPID PANEL
Chol/HDL Ratio: 3.3 ratio (ref 0.0–4.4)
Cholesterol, Total: 170 mg/dL (ref 100–199)
HDL: 51 mg/dL (ref >39)
LDL Calculated: 99 mg/dL (ref 0–99)
Triglycerides: 102 mg/dL (ref 0–149)
VLDL CHOLESTEROL CAL: 20 mg/dL (ref 5–40)

## 2018-09-21 LAB — THYROID PANEL WITH TSH
FREE THYROXINE INDEX: 2.7 (ref 1.2–4.9)
T3 Uptake Ratio: 31 % (ref 24–39)
T4, Total: 8.7 ug/dL (ref 4.5–12.0)
TSH: 1.27 u[IU]/mL (ref 0.450–4.500)

## 2018-09-21 LAB — HEMOGLOBIN A1C
Est. average glucose Bld gHb Est-mCnc: 114 mg/dL
Hgb A1c MFr Bld: 5.6 % (ref 4.8–5.6)

## 2018-09-26 NOTE — Progress Notes (Signed)
Patient notified of results & recommendations. Expressed understanding.

## 2018-10-18 ENCOUNTER — Other Ambulatory Visit (HOSPITAL_COMMUNITY): Payer: Self-pay | Admitting: *Deleted

## 2018-10-18 DIAGNOSIS — Z1231 Encounter for screening mammogram for malignant neoplasm of breast: Secondary | ICD-10-CM

## 2019-03-02 ENCOUNTER — Ambulatory Visit
Admission: RE | Admit: 2019-03-02 | Discharge: 2019-03-02 | Disposition: A | Discharge status: Home / Self Care | Payer: No Typology Code available for payment source | Source: Ambulatory Visit | Attending: Obstetrics and Gynecology | Admitting: Obstetrics and Gynecology

## 2019-03-02 ENCOUNTER — Other Ambulatory Visit: Payer: Self-pay

## 2019-03-02 ENCOUNTER — Encounter (HOSPITAL_COMMUNITY): Payer: Self-pay

## 2019-03-02 ENCOUNTER — Ambulatory Visit (HOSPITAL_COMMUNITY)
Admission: RE | Admit: 2019-03-02 | Discharge: 2019-03-02 | Disposition: A | Discharge status: Home / Self Care | Payer: Self-pay | Source: Ambulatory Visit | Attending: Obstetrics and Gynecology | Admitting: Obstetrics and Gynecology

## 2019-03-02 DIAGNOSIS — Z01419 Encounter for gynecological examination (general) (routine) without abnormal findings: Secondary | ICD-10-CM

## 2019-03-02 DIAGNOSIS — Z1231 Encounter for screening mammogram for malignant neoplasm of breast: Secondary | ICD-10-CM

## 2019-03-02 NOTE — Addendum Note (Signed)
Encounter addended by: Loletta Parish, RN on: 03/02/2019 12:06 PM  Actions taken: Clinical Note Signed

## 2019-03-02 NOTE — Patient Instructions (Addendum)
Explained breast self awareness with [employer]. Let patient know [facility] will cover Pap smears and HPV typing every 5 years unless has a history of abnormal Pap smears. Referred patient to the [facility] for a screening mammogram. Appointment scheduled for Thursday, March 02, 2019 at 1020. Patient aware of appointment and will be there. Let patient know will follow up with her within the next week with results of Pap smear and wet prep by phone. Informed patient that the [facility] will follow-up with her within the next couple of weeks with results of mammogram by letter or phone. Discussed smoking cessation with patient. Referred to the [facility] Quitline and gave resources to free smoking cessation classes at [facility]. [place] verbalized understanding.  Tonette Koehne, Arvil [place], RN 11:48 AM

## 2019-03-02 NOTE — Progress Notes (Signed)
Complaints of bilateral breast clear discharge x 20 years when expressed. Patient stated has been worked up and negative.  Pap Smear: Pap smear completed today. Last Pap smear was 5 years ago in [place] and normal per patient. Per patient has no history of an abnormal Pap smear. No Pap smear results are in Epic.  Physical exam: Breasts Breasts symmetrical. No skin abnormalities bilateral breasts. No nipple retraction bilateral breasts. No nipple discharge bilateral breasts. Unable to express any nipple discharge on exam. No lymphadenopathy. No lumps palpated bilateral breasts. No complaints of pain or tenderness on exam. Referred patient to the [facility] for a screening mammogram. Appointment scheduled for Thursday, March 02, 2019 at 1020.        Pelvic/Bimanual   Ext Genitalia No lesions, no swelling and no discharge observed on external genitalia.         Vagina Vagina pink and normal texture. No lesions and thick white discharge observed in vagina. Wet prep completed      Cervix Cervix is present. Cervix pink and of normal texture. Thick white discharge observed on cervix.    Uterus Uterus is present and palpable. Patient has a history of uterine fibroids. Uterus enlarged consistent with fibroids.     Adnexae Bilateral ovaries present and palpable. No tenderness on palpation.         Rectovaginal No rectal exam completed today since patient had no rectal complaints. No skin abnormalities observed on exam.    Smoking History: Patient is a current smoker. Discussed smoking cessation with patient. Referred to the [facility] Quitline and gave resources to free smoking cessation classes at [facility].  Patient Navigation: Patient education provided. Access to services provided for patient through [facility] program. Spanish interpreter provided.   Colorectal Cancer Screening: Per patient has never had a colonoscopy completed. No complaints today.   Breast and Cervical Cancer  Risk Assessment: Patient has a family history of her mother having breast cancer. Patient has no known genetic mutations or history of radiation treatment to the chest before age 72. Patient has no history of cervical dysplasia, immunocompromised, or DES exposure in-utero.  Risk Assessment    Risk Scores      03/02/2019   Last edited by: Armond Hang, LPN   5-year risk: 2.1 %   Lifetime risk: 12.8 %

## 2019-03-03 LAB — CYTOLOGY - PAP
Diagnosis: NEGATIVE
HPV: NOT DETECTED

## 2019-03-08 ENCOUNTER — Other Ambulatory Visit (HOSPITAL_COMMUNITY): Payer: Self-pay | Admitting: *Deleted

## 2019-03-08 ENCOUNTER — Telehealth (HOSPITAL_COMMUNITY): Payer: Self-pay | Admitting: *Deleted

## 2019-03-08 LAB — CERVICOVAGINAL ANCILLARY ONLY
Candida vaginitis: NEGATIVE
Chlamydia: NEGATIVE
Neisseria Gonorrhea: NEGATIVE
Trichomonas: NEGATIVE

## 2019-03-08 MED ORDER — METRONIDAZOLE 500 MG PO TABS: 500.0000 mg | ORAL_TABLET | Freq: Two times a day (BID) | ORAL | 0 refills | Status: DC

## 2019-03-08 NOTE — Telephone Encounter (Signed)
Patient called me about her results. Let her know that her Pap smear was normal and HPV negative. Since patient has no history of an abnormal Pap smear her next Pap smear is due in 5 years. Let patient know that the wet prep completed did show BV and that a prescription for Flagyl was called to her pharmacy. Confirmed the pharmacy that the prescription was called in. Explained Flagyl and the importance of avoiding alcohol while taking. Patient verbalized understanding.

## 2019-03-08 NOTE — Telephone Encounter (Signed)
Telephoned patient at home number and left message to return call to [employer]. Medication for bacterial vaginosis called into [facility] [employer].

## 2019-03-20 ENCOUNTER — Encounter: Payer: Self-pay | Admitting: *Deleted

## 2019-03-22 ENCOUNTER — Telehealth: Payer: Self-pay

## 2019-03-22 NOTE — Telephone Encounter (Signed)

## 2019-03-23 ENCOUNTER — Other Ambulatory Visit: Payer: Self-pay

## 2019-03-23 ENCOUNTER — Ambulatory Visit (INDEPENDENT_AMBULATORY_CARE_PROVIDER_SITE_OTHER): Payer: Self-pay | Admitting: Family Medicine

## 2019-03-23 VITALS — BP 143/83 | HR 58 | Temp 97.3°F | Resp 17 | Wt 205.0 lb

## 2019-03-23 DIAGNOSIS — Z1211 Encounter for screening for malignant neoplasm of colon: Secondary | ICD-10-CM

## 2019-03-23 DIAGNOSIS — I10 Essential (primary) hypertension: Secondary | ICD-10-CM

## 2019-03-23 DIAGNOSIS — Z6833 Body mass index (BMI) 33.0-33.9, adult: Secondary | ICD-10-CM

## 2019-03-23 DIAGNOSIS — Z95828 Presence of other vascular implants and grafts: Secondary | ICD-10-CM

## 2019-03-23 DIAGNOSIS — Z1159 Encounter for screening for other viral diseases: Secondary | ICD-10-CM

## 2019-03-23 DIAGNOSIS — Z23 Encounter for immunization: Secondary | ICD-10-CM

## 2019-03-23 DIAGNOSIS — E6609 Other obesity due to excess calories: Secondary | ICD-10-CM

## 2019-03-23 MED ORDER — FUROSEMIDE 20 MG PO TABS: 20.0000 mg | ORAL_TABLET | Freq: Every day | ORAL | 1 refills | Status: DC

## 2019-03-23 MED ORDER — CARVEDILOL 25 MG PO TABS: 25.0000 mg | ORAL_TABLET | Freq: Two times a day (BID) | ORAL | 1 refills | Status: DC

## 2019-03-23 MED ORDER — POTASSIUM CHLORIDE ER 10 MEQ PO TBCR: 10.0000 meq | EXTENDED_RELEASE_TABLET | Freq: Every day | ORAL | 1 refills | Status: DC

## 2019-03-23 MED ORDER — SIMVASTATIN 20 MG PO TABS: 20.0000 mg | ORAL_TABLET | Freq: Every day | ORAL | 1 refills | Status: DC

## 2019-03-23 MED ORDER — AMLODIPINE BESYLATE 10 MG PO TABS: 10.0000 mg | ORAL_TABLET | Freq: Every day | ORAL | 1 refills | Status: DC

## 2019-03-23 MED ORDER — LISINOPRIL 40 MG PO TABS: 40.0000 mg | ORAL_TABLET | Freq: Every day | ORAL | 1 refills | Status: DC

## 2019-03-23 NOTE — Progress Notes (Signed)
Subjective:  Patient ID: Ashley Guerrero, female    DOB: Jan 01, 1966  Age: 53 y.o. 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  Currently requests referral to her cardiac surgeon Dr.Gerhardt for regular follow-up. She is obese and endorses eating lots of fast foods and not exercising much but is promising to do better. Tolerating her antihypertensive with no adverse effects from her medication.  Past Medical History:  Diagnosis Date  . Aortic dissection ([place])    Status post repair 5/16  . Chronic diastolic CHF (congestive heart failure) ([place])    Echo 6/16:  Severe LVH, EF 60-65%, no RWMA, Gr 1 DD, LA upper limits of normal, mild RAE, aneurysmal intra-atrial septum  . Fibroid uterus   . Hypertension     Past Surgical History:  Procedure Laterality Date  . REPLACEMENT ASCENDING AORTA N/A 11/03/2014   Procedure: REPLACEMENT ASCENDING AORTA with a 15m hemashield graft,  resuspension of aortic valve, hypothermic circulatory arrest and cardiopulmonary bypass.;  Surgeon: EGrace Isaac MD;  Location: [facility]  Service: Open Heart Surgery;  Laterality: N/A;    Family History  Problem Relation Age of Onset  . Hypertension Mother   . Breast cancer Mother   . Stroke Father   . Heart failure Father   . Hypertension Father   . Diabetes Father   . Hyperlipidemia Father   . Hypertension Sister   . Hypertension Brother   . Cancer Sister   . Heart attack Neg Hx     Allergies  Allergen Reactions  . Influenza Vaccines     Outpatient Medications Prior to Visit  Medication Sig Dispense Refill  . aspirin EC 325 MG EC tablet Take 1 tablet (325 mg total) by mouth daily.    .Marland KitchenamLODipine (NORVASC) 10 MG tablet  Take 1 tablet (10 mg total) by mouth daily. 30 tablet 3  . carvedilol (COREG) 25 MG tablet Take 1 tablet (25 mg total) by mouth 2 (two) times daily with a meal. 60 tablet 3  . furosemide (LASIX) 20 MG tablet Take 1 tablet (20 mg total) by mouth daily. 30 tablet 3  . lisinopril (PRINIVIL,ZESTRIL) 40 MG tablet Take 1 tablet (40 mg total) by mouth daily. 30 tablet 3  . potassium chloride (K-DUR) 10 MEQ tablet Take 1 tablet (10 mEq total) by mouth daily. 30 tablet 3  . simvastatin (ZOCOR) 20 MG tablet Take 1 tablet (20 mg total) by mouth daily. 30 tablet 3  . metroNIDAZOLE (FLAGYL) 500 MG tablet Take 1 tablet (500 mg total) by mouth 2 (two) times daily. 14 tablet 0   No facility-administered medications prior to visit.      ROS Review of Systems  Constitutional: Negative for activity change, appetite change and fatigue.  HENT: Negative for congestion, sinus pressure and sore throat.   Eyes: Negative for visual disturbance.  Respiratory: Negative for cough, chest tightness, shortness of breath and wheezing.   Cardiovascular: Negative for chest pain and palpitations.  Gastrointestinal: Negative for abdominal distention, abdominal pain and constipation.  Endocrine: Negative for polydipsia.  Genitourinary: Negative for dysuria and frequency.  Musculoskeletal: Negative for arthralgias and back pain.  Skin: Negative for rash.  Neurological: Negative for tremors, light-headedness and numbness.  Hematological: Does not bruise/bleed easily.  Psychiatric/Behavioral: Negative  for agitation and behavioral problems.    Objective:  BP (!) 143/83   Pulse (!) 58   Temp (!) 97.3 F (36.3 C) (Temporal)   Resp 17   Wt 205 lb (93 kg)   LMP 10/29/2014   SpO2 97%   BMI 33.09 kg/m   BP/Weight 03/23/2019 03/02/2019 2/35/3614  Systolic BP 431 540 086  Diastolic BP 83 98 76  Wt. (Lbs) 205 208 209.8  BMI 33.09 33.57 33.86      Physical Exam Constitutional:      Appearance: She is well-developed.   Cardiovascular:     Rate and Rhythm: Normal rate.     Heart sounds: Normal heart sounds. No murmur.  Pulmonary:     Effort: Pulmonary effort is normal.     Breath sounds: Normal breath sounds. No wheezing or rales.     Comments: Healed vertical sternotomy scar Chest:     Chest wall: No tenderness.  Abdominal:     General: Bowel sounds are normal. There is no distension.     Palpations: Abdomen is soft. There is no mass.     Tenderness: There is no abdominal tenderness.  Musculoskeletal: Normal range of motion.  Neurological:     Mental Status: She is alert and oriented to person, place, and time.     CMP Latest Ref Rng & Units 09/20/2018 12/14/2014 12/06/2014  Glucose 65 - 99 mg/dL 95 94 92  BUN 6 - 24 mg/dL 10 8 9   Creatinine 0.57 - 1.00 mg/dL 0.87 0.82 0.72  Sodium 134 - 144 mmol/L 142 137 136  Potassium 3.5 - 5.2 mmol/L 4.6 3.9 3.7  Chloride 96 - 106 mmol/L 105 101 102  CO2 20 - 29 mmol/L 23 29 28   Calcium 8.7 - 10.2 mg/dL 9.5 9.5 9.2  Total Protein 6.0 - 8.5 g/dL 7.1 - -  Total Bilirubin 0.0 - 1.2 mg/dL 0.3 - -  Alkaline Phos 39 - 117 IU/L 97 - -  AST 0 - 40 IU/L 15 - -  ALT 0 - 32 IU/L 16 - -    Lipid Panel     Component Value Date/Time   CHOL 170 09/20/2018 0935   TRIG 102 09/20/2018 0935   HDL 51 09/20/2018 0935   CHOLHDL 3.3 09/20/2018 0935   LDLCALC 99 09/20/2018 0935    CBC    Component Value Date/Time   WBC 5.3 09/20/2018 0935   WBC 7.1 12/06/2014 1000   RBC 4.35 09/20/2018 0935   RBC 3.77 (L) 12/06/2014 1000   HGB 13.5 09/20/2018 0935   HCT 40.6 09/20/2018 0935   PLT 286 09/20/2018 0935   MCV 93 09/20/2018 0935   MCH 31.0 09/20/2018 0935   MCH 29.4 11/07/2014 0353   MCHC 33.3 09/20/2018 0935   MCHC 32.5 12/06/2014 1000   RDW 13.7 09/20/2018 0935   LYMPHSABS 2.3 09/20/2018 0935   MONOABS 0.7 12/06/2014 1000   EOSABS 0.2 09/20/2018 0935   BASOSABS 0.0 09/20/2018 0935    Lab Results  Component Value Date   HGBA1C 5.6 09/20/2018     Assessment & Plan:   1. Essential hypertension Slightly elevated No regimen change today Counseled on blood pressure goal of less than 130/80, low-sodium, DASH diet, medication compliance, 150 minutes of moderate intensity exercise per week. Discussed medication compliance, adverse effects. - amLODipine (NORVASC) 10 MG tablet; Take 1 tablet (10 mg total) by mouth daily.  Dispense: 90 tablet; Refill: 1 - carvedilol (COREG) 25 MG tablet; Take 1 tablet (  25 mg total) by mouth 2 (two) times daily with a meal.  Dispense: 180 tablet; Refill: 1 - lisinopril (ZESTRIL) 40 MG tablet; Take 1 tablet (40 mg total) by mouth daily.  Dispense: 90 tablet; Refill: 1 - potassium chloride (K-DUR) 10 MEQ tablet; Take 1 tablet (10 mEq total) by mouth daily.  Dispense: 90 tablet; Refill: 1 - CMP14+EGFR - Lipid panel  2. Screening for colon cancer - Ambulatory referral to Gastroenterology  3. Screening for viral disease - HIV Antibody (routine testing w rflx)  4. S/P ascending aortic replacement Stable, asymptomatic Referred back to [facility] for follow-up as per request - furosemide (LASIX) 20 MG tablet; Take 1 tablet (20 mg total) by mouth daily.  Dispense: 90 tablet; Refill: 1 - simvastatin (ZOCOR) 20 MG tablet; Take 1 tablet (20 mg total) by mouth daily.  Dispense: 90 tablet; Refill: 1 - Ambulatory referral to Cardiothoracic Surgery  5. Class 1 obesity due to excess calories with serious comorbidity and body mass index (BMI) of 33.0 to 33.9 in adult Counseled on weight loss, reducing portion sizes, increasing physical activity and she promises to work on this She has also been ingesting lots of fast food and will be cutting this out  6. Need for Tdap vaccination - Tdap vaccine greater than or equal to 7yo IM   Health Care Maintenance: See #2 above; well woman exam performed by GYN on 03/02/2019 Meds ordered this encounter  Medications  . amLODipine (NORVASC) 10 MG tablet    Sig:  Take 1 tablet (10 mg total) by mouth daily.    Dispense:  90 tablet    Refill:  1  . carvedilol (COREG) 25 MG tablet    Sig: Take 1 tablet (25 mg total) by mouth 2 (two) times daily with a meal.    Dispense:  180 tablet    Refill:  1  . furosemide (LASIX) 20 MG tablet    Sig: Take 1 tablet (20 mg total) by mouth daily.    Dispense:  90 tablet    Refill:  1  . lisinopril (ZESTRIL) 40 MG tablet    Sig: Take 1 tablet (40 mg total) by mouth daily.    Dispense:  90 tablet    Refill:  1  . potassium chloride (K-DUR) 10 MEQ tablet    Sig: Take 1 tablet (10 mEq total) by mouth daily.    Dispense:  90 tablet    Refill:  1  . simvastatin (ZOCOR) 20 MG tablet    Sig: Take 1 tablet (20 mg total) by mouth daily.    Dispense:  90 tablet    Refill:  1    Follow-up: Return in about 3 months (around 06/22/2019) for medical conditions.       Charlott Rakes, MD, FAAFP. [facility] [place], [place]   03/23/2019, 10:34 AM

## 2019-03-23 NOTE — Patient Instructions (Signed)

## 2019-03-27 LAB — CMP14+EGFR
ALT: 12 IU/L (ref 0–32)
AST: 12 IU/L (ref 0–40)
Albumin/Globulin Ratio: 1.6 (ref 1.2–2.2)
Albumin: 4.2 g/dL (ref 3.8–4.9)
Alkaline Phosphatase: 109 IU/L (ref 39–117)
BUN/Creatinine Ratio: 14 (ref 9–23)
BUN: 10 mg/dL (ref 6–24)
Bilirubin Total: 0.3 mg/dL (ref 0.0–1.2)
CO2: 25 mmol/L (ref 20–29)
Calcium: 9.3 mg/dL (ref 8.7–10.2)
Chloride: 105 mmol/L (ref 96–106)
Creatinine, Ser: 0.7 mg/dL (ref 0.57–1.00)
GFR calc Af Amer: 114 mL/min/{1.73_m2} (ref >59)
GFR calc non Af Amer: 99 mL/min/{1.73_m2} (ref >59)
Globulin, Total: 2.7 g/dL (ref 1.5–4.5)
Glucose: 98 mg/dL (ref 65–99)
Potassium: 4.2 mmol/L (ref 3.5–5.2)
Sodium: 144 mmol/L (ref 134–144)
Total Protein: 6.9 g/dL (ref 6.0–8.5)

## 2019-03-27 LAB — HIV ANTIBODY (ROUTINE TESTING W REFLEX): HIV Screen 4th Generation wRfx: NONREACTIVE

## 2019-03-27 LAB — LIPID PANEL
Chol/HDL Ratio: 3.2 ratio (ref 0.0–4.4)
Cholesterol, Total: 161 mg/dL (ref 100–199)
HDL: 51 mg/dL (ref >39)
LDL Chol Calc (NIH): 91 mg/dL (ref 0–99)
Triglycerides: 105 mg/dL (ref 0–149)
VLDL Cholesterol Cal: 19 mg/dL (ref 5–40)

## 2019-03-30 ENCOUNTER — Other Ambulatory Visit: Payer: Self-pay | Admitting: Family Medicine

## 2019-03-30 DIAGNOSIS — Z95828 Presence of other vascular implants and grafts: Secondary | ICD-10-CM

## 2019-03-30 NOTE — Progress Notes (Signed)
Patient is scheduled for CT scan at [facility] on 04/10/2019 @ 4:15 PM. Patient can only drink liquids after 12:15 PM.  Please inform patient of appointment.

## 2019-03-30 NOTE — Progress Notes (Signed)
Can you please schedule CTA chest for this patient? Thank you.

## 2019-04-10 ENCOUNTER — Other Ambulatory Visit: Payer: Self-pay

## 2019-04-10 ENCOUNTER — Ambulatory Visit (HOSPITAL_COMMUNITY)
Admission: RE | Admit: 2019-04-10 | Discharge: 2019-04-10 | Disposition: A | Discharge status: Home / Self Care | Payer: Self-pay | Source: Ambulatory Visit | Attending: Family Medicine | Admitting: Family Medicine

## 2019-04-10 DIAGNOSIS — Z95828 Presence of other vascular implants and grafts: Secondary | ICD-10-CM | POA: Insufficient documentation

## 2019-04-10 MED ORDER — IOHEXOL 350 MG/ML SOLN
100.0000 mL | Freq: Once | INTRAVENOUS | Status: AC | PRN
  Administered 2019-04-10: 100 mL via INTRAVENOUS

## 2019-04-13 ENCOUNTER — Ambulatory Visit (INDEPENDENT_AMBULATORY_CARE_PROVIDER_SITE_OTHER): Payer: Self-pay | Admitting: Cardiothoracic Surgery

## 2019-04-13 ENCOUNTER — Encounter: Payer: Self-pay | Admitting: Cardiothoracic Surgery

## 2019-04-13 ENCOUNTER — Other Ambulatory Visit: Payer: Self-pay

## 2019-04-13 VITALS — BP 136/60 | HR 54 | Temp 97.3°F | Resp 20 | Ht 66.0 in | Wt 205.0 lb

## 2019-04-13 DIAGNOSIS — Z95828 Presence of other vascular implants and grafts: Secondary | ICD-10-CM

## 2019-04-13 DIAGNOSIS — I71019 Dissection of thoracic aorta, unspecified: Secondary | ICD-10-CM

## 2019-04-13 DIAGNOSIS — I7101 Dissection of thoracic aorta: Secondary | ICD-10-CM

## 2019-04-13 NOTE — Progress Notes (Signed)
[address]       Eugenio Saenz,[place] [address]             [phone]      [place] Record [id number] Date of Birth: Sep 27, 1965  Referring: Darlin Coco, MD Primary Care: Scot Jun, FNP Primary Cardiologist: No primary care provider on file.   Chief Complaint:   POST OP FOLLOW UP DATE OF PROCEDURE:  11/03/2014 OPERATIVE REPORT PREOPERATIVE DIAGNOSIS:  Acute type 1 aortic dissection. POSTOPERATIVE DIAGNOSIS:  Acute type 1 aortic dissection. SURGICAL PROCEDURE:  Repair of ascending aortic dissection with replacement of ascending aorta with 30 mm Hemashield graft and resuspension of the aortic valve with hypothermic circulatory arrest and cardiopulmonary bypass. SURGEON:  Lanelle Bal, MD  History of Present Illness:     Patient originally presented in April 2016 with acute aortic dissection.  At that time she was visiting [place] for her ill father but living in [place].  Following surgery she returned to [place].  She now returns to reestablish care now that she is living full-time in [place].  She notes that no follow-up studies were done after her return to [place] other than the EKG.   She was originally seen by Dr. Mare Ferrari in 2016 postop.     Past Medical History:  Diagnosis Date  . Aortic dissection ([place])    Status post repair 5/16  . Chronic diastolic CHF (congestive heart failure) ([place])    Echo 6/16:  Severe LVH, EF 60-65%, no RWMA, Gr 1 DD, LA upper limits of normal, mild RAE, aneurysmal intra-atrial septum  . Fibroid uterus   . Hypertension      Social History   Tobacco Use  Smoking Status Current Every Day Smoker  . Packs/day: 1.00  . Types: Cigarettes  . Start date: 11/03/2014  Smokeless Tobacco Never Used    Social History   Substance and Sexual Activity  Alcohol Use Not Currently  . Alcohol/week: 0.0 standard drinks   Comment: occasional     Allergies  Allergen  Reactions  . Influenza Vaccines     Current Outpatient Medications  Medication Sig Dispense Refill  . amLODipine (NORVASC) 10 MG tablet Take 1 tablet (10 mg total) by mouth daily. 90 tablet 1  . aspirin EC 325 MG EC tablet Take 1 tablet (325 mg total) by mouth daily.    . carvedilol (COREG) 25 MG tablet Take 1 tablet (25 mg total) by mouth 2 (two) times daily with a meal. 180 tablet 1  . furosemide (LASIX) 20 MG tablet Take 1 tablet (20 mg total) by mouth daily. 90 tablet 1  . lisinopril (ZESTRIL) 40 MG tablet Take 1 tablet (40 mg total) by mouth daily. 90 tablet 1  . potassium chloride (K-DUR) 10 MEQ tablet Take 1 tablet (10 mEq total) by mouth daily. 90 tablet 1  . simvastatin (ZOCOR) 20 MG tablet Take 1 tablet (20 mg total) by mouth daily. 90 tablet 1   No current facility-administered medications for this visit.        Physical Exam: BP 136/60 (BP Location: Right Arm, Patient Position: Sitting, Cuff Size: Normal)   Pulse (!) 54   Temp (!) 97.3 F (36.3 C)   Resp 20   Ht 5\' 6"  (1.676 m)   Wt 205 lb (93 kg)   LMP 10/29/2014   SpO2 99% Comment: RA  BMI 33.09 kg/m   General appearance: alert, cooperative, appears stated age  and no distress Neurologic: intact Heart: regular rate and rhythm, S1, S2 normal, no murmur, click, rub or gallop Lungs: clear to auscultation bilaterally Abdomen: soft, non-tender; bowel sounds normal; no masses,  no organomegaly Extremities: extremities normal, atraumatic, no cyanosis or edema Wound: sterum stable   Diagnostic Studies & Laboratory data:     Recent Radiology Findings:  Ct Angio Chest W/cm &/or Wo Cm  Result Date: 04/10/2019 CLINICAL DATA:  Ascending aortic dissection, post replacement, follow-up. EXAM: CT ANGIOGRAPHY CHEST WITH CONTRAST TECHNIQUE: Multidetector CT imaging of the chest was performed using the standard protocol during bolus administration of intravenous contrast. Multiplanar CT image reconstructions and MIPs were  obtained to evaluate the vascular anatomy. CONTRAST:  164mL OMNIPAQUE IOHEXOL 350 MG/ML SOLN COMPARISON:  Chest CT angiogram dated 11/03/2014. FINDINGS: Cardiovascular: Surgical changes at the level of the ascending aorta. Mild aneurysmal dilatation of the ascending thoracic aorta measuring 3.5 cm diameter, decreased compared to the aneurysmal dilatation of 4.2 cm on chest CT angiogram of 11/03/2014. Aortic arch and descending thoracic aorta are normal in caliber. No residual dissection within the ascending thoracic aorta or aortic arch. Great vessel branches are patent. Expected evolution of the chronic aortic dissection within the descending thoracic aorta extending into the upper portion of the abdominal aorta (incompletely imaged), with patency of both the true and false lumens. Heart size is normal. No pericardial effusion. Dense coronary artery calcifications of the LAD. Mediastinum/Nodes: Esophagus is unremarkable. Trachea and central bronchi are unremarkable. Lungs/Pleura: Lungs are clear. No pleural effusion or pneumothorax. Upper Abdomen: Limited images of the upper abdomen are otherwise unremarkable. Musculoskeletal: Degenerative spondylosis of the thoracic spine, moderate in degree. Median sternotomy wires in place. Soft tissues of the chest wall are unremarkable. Review of the MIP images confirms the above findings. IMPRESSION: 1. Surgical changes at the level of the ascending aorta, compatible with the given history of previous dissection repair. 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 No residual dissection within the ascending thoracic aorta or aortic arch. 2. 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 3. No acute findings.  Lungs are clear. Aortic Atherosclerosis (ICD10-I70.0). Electronically Signed   By: Franki Cabot M.D.   On: 04/10/2019 17:32   I have independently reviewed the above radiology studies  and reviewed the findings with the patient.   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  We will plan to see her back with a CTA of the chest abdomen and pelvis in 1 year I stressed to her the importance of maintaining good long-term blood pressure control.   Medication Changes: No orders of the defined types were placed in this encounter.     [name] MD      [address] [place] [address] Office [phone]   Beeper [phone]  04/13/2019 10:08 PM

## 2019-06-08 ENCOUNTER — Ambulatory Visit: Payer: No Typology Code available for payment source

## 2019-06-13 ENCOUNTER — Other Ambulatory Visit: Payer: Self-pay

## 2019-06-13 ENCOUNTER — Ambulatory Visit (INDEPENDENT_AMBULATORY_CARE_PROVIDER_SITE_OTHER): Payer: Self-pay | Admitting: Cardiovascular Disease

## 2019-06-13 VITALS — BP 161/80 | HR 57 | Temp 96.6°F | Ht 67.0 in | Wt 213.0 lb

## 2019-06-13 DIAGNOSIS — I71019 Dissection of thoracic aorta, unspecified: Secondary | ICD-10-CM

## 2019-06-13 DIAGNOSIS — I7101 Dissection of thoracic aorta: Secondary | ICD-10-CM

## 2019-06-13 DIAGNOSIS — Z8679 Personal history of other diseases of the circulatory system: Secondary | ICD-10-CM

## 2019-06-13 DIAGNOSIS — E7800 Pure hypercholesterolemia, unspecified: Secondary | ICD-10-CM

## 2019-06-13 DIAGNOSIS — I10 Essential (primary) hypertension: Secondary | ICD-10-CM

## 2019-06-13 DIAGNOSIS — Z9889 Other specified postprocedural states: Secondary | ICD-10-CM

## 2019-06-13 MED ORDER — ASPIRIN EC 81 MG PO TBEC: 81.0000 mg | DELAYED_RELEASE_TABLET | Freq: Every day | ORAL | 3 refills | Status: AC

## 2019-06-13 NOTE — Patient Instructions (Addendum)
Medication Instructions:  DECREASE the Aspirin to 81 mg once daily  *If you need a refill on your cardiac medications before your next appointment, please call your pharmacy*  Lab Work: None ordered If you have labs (blood work) drawn today and your tests are completely normal, you will receive your results only by: Marland Kitchen MyChart Message (if you have MyChart) OR . A paper copy in the mail If you have any lab test that is abnormal or we need to change your treatment, we will call you to review the results.  Testing/Procedures: None ordered  Follow-Up: At [facility], you and your health needs are our priority.  As part of our continuing mission to provide you with exceptional heart care, we have created designated Provider Care Teams.  These Care Teams include your primary Cardiologist (physician) and Advanced Practice Providers (APPs -  Physician Assistants and Nurse Practitioners) who all work together to provide you with the care you need, when you need it.  Your next appointment:   12 month(s)  The format for your next appointment:   In Person  Provider:   Sanda Klein, MD   Dr. Sallyanne Kuster would like you to check your blood pressure DAILY for the next few weeks.  Keep a journal of these daily BP and heart rate readings and call our office or send a message through [place] with the results.

## 2019-06-13 NOTE — Progress Notes (Signed)
Cardiology Office Note:    Date:  06/18/2019   ID:  Ashley Guerrero, DOB [birth year], MRN [id number]  PCP:  Scot Jun, FNP  Cardiologist:  No primary care provider on file.  Electrophysiologist:  None   Referring MD: [name], MD   Chief Complaint  Patient presents with  . Thoracic Aortic Aneurysm    History of Present Illness:    Ashley Guerrero is a 53 y.o. female with a hx of acute type I aortic dissection in 2016, treated with emergency surgery and placement of a 30 mm Hemashield graft and resuspension of the aortic valve.  Her dissection extended down the descending aorta all the way to the left common iliac artery.  She has done well since that time without any recurrent chest symptoms and with adequate blood pressure control.  Her echocardiogram in 2016 showed preserved LVEF and severe left ventricular hypertrophy.  She bears a diagnosis of chronic diastolic heart failure, but has not had symptoms of heart failure on a very low-dose of loop diuretic.  She is on maximal doses of amlodipine and lisinopril and carvedilol.  She takes a statin for hyperlipidemia and her most recent LDL cholesterol was 99 in March 2020.  She is obese with a BMI of around 33.  The patient specifically denies any chest pain at rest exertion, dyspnea at rest or with exertion, orthopnea, paroxysmal nocturnal dyspnea, syncope, palpitations, focal neurological deficits, intermittent claudication, lower extremity edema, unexplained weight gain, cough, hemoptysis or wheezing.   Past Medical History:  Diagnosis Date  . Aortic dissection ([place])    Status post repair 5/16  . Chronic diastolic CHF (congestive heart failure) ([place])    Echo 6/16:  Severe LVH, EF 60-65%, no RWMA, Gr 1 DD, LA upper limits of normal, mild RAE, aneurysmal intra-atrial septum  . Fibroid uterus   . Hypertension     Past Surgical History:  Procedure Laterality Date  . REPLACEMENT ASCENDING AORTA N/A 11/03/2014   Procedure: REPLACEMENT ASCENDING AORTA with a 14mm hemashield graft,  resuspension of aortic valve, hypothermic circulatory arrest and cardiopulmonary bypass.;  Surgeon: [name], MD;  Location: [place];  Service: Open Heart Surgery;  Laterality: N/A;    Current Medications: Current Meds  Medication Sig  . amLODipine (NORVASC) 10 MG tablet Take 1 tablet (10 mg total) by mouth daily.  . carvedilol (COREG) 25 MG tablet Take 1 tablet (25 mg total) by mouth 2 (two) times daily with a meal.  . furosemide (LASIX) 20 MG tablet Take 1 tablet (20 mg total) by mouth daily.  Marland Kitchen lisinopril (ZESTRIL) 40 MG tablet Take 1 tablet (40 mg total) by mouth daily.  . potassium chloride (K-DUR) 10 MEQ tablet Take 1 tablet (10 mEq total) by mouth daily.  . simvastatin (ZOCOR) 20 MG tablet Take 1 tablet (20 mg total) by mouth daily.  . [DISCONTINUED] aspirin EC 325 MG EC tablet Take 1 tablet (325 mg total) by mouth daily.     Allergies:   Influenza vaccines   Social History   Socioeconomic History  . Marital status: Single    Spouse name: Not on file  . Number of children: 0  . Years of education: Not on file  . Highest education level: Some college, no degree  Occupational History  . Not on file  Tobacco Use  . Smoking status: Current Every Day Smoker    Packs/day: 1.00    Types: Cigarettes    Start date: 11/03/2014  . Smokeless  tobacco: Never Used  Substance and Sexual Activity  . Alcohol use: Not Currently    Alcohol/week: 0.0 standard drinks    Comment: occasional  . Drug use: Not Currently    Types: Marijuana  . Sexual activity: Not Currently  Other Topics Concern  . Not on file  Social History Narrative  . Not on file   Social Determinants of Health   Financial Resource Strain:   . Difficulty of Paying Living Expenses: Not on file  Food Insecurity:   . Worried About Charity fundraiser in the Last Year: Not on file  . Ran Out of Food in the Last Year: Not on file   Transportation Needs: No Transportation Needs  . Lack of Transportation (Medical): No  . Lack of Transportation (Non-Medical): No  Physical Activity:   . Days of Exercise per Week: Not on file  . Minutes of Exercise per Session: Not on file  Stress:   . Feeling of Stress : Not on file  Social Connections:   . Frequency of Communication with Friends and Family: Not on file  . Frequency of Social Gatherings with Friends and Family: Not on file  . Attends Religious Services: Not on file  . Active Member of Clubs or Organizations: Not on file  . Attends Archivist Meetings: Not on file  . Marital Status: Not on file     Family History: The patient's family history includes Breast cancer in her mother; Cancer in her sister; Diabetes in her father; Heart failure in her father; Hyperlipidemia in her father; Hypertension in her brother, father, mother, and sister; Stroke in her father. There is no history of Heart attack.  ROS:   Please see the history of present illness.     All other systems reviewed and are negative.  EKGs/Labs/Other Studies Reviewed:    The following studies were reviewed today: CT angiogram of the chest 04/10/2019 IMPRESSION: 1. Surgical changes at the level of the ascending aorta, compatible with the given history of previous dissection repair. 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 No residual dissection within the ascending thoracic aorta or aortic arch. 2. Expected evolution of the chronic aortic dissection within the descending thoracic aorta extending into the upper portion of the abdominal aorta (incompletely imaged), with commensurate aneurysm measuring 3.8 cm diameter, with patency of both the true and false lumens. 3. No acute findings.  Lungs are clear.  Echocardiogram  2016 - Left ventricle: The cavity size was normal. There was severe   concentric hypertrophy. Systolic function was normal. The   estimated ejection fraction was in the range of 60% to 65%. Wall   motion was normal; there were no regional wall motion   abnormalities. Doppler parameters are consistent with abnormal   left ventricular relaxation (grade 1 diastolic dysfunction). The   E/e&' ratio is between 8-15, suggseting indeterminate LV Filling   pressure. - Mitral valve: Calcified annulus. Mildly thickened leaflets . - Left atrium: The atrium was at the upper limits of normal in   size. - Right atrium: The atrium was mildly dilated. - Atrial septum: Aneurysmal IAS - cannot exclude PFO.  Impressions:  - LVEF 60-65%, severe LVH, diastolic dysfunction, indeterminate LV   filling pressure, ascending aorta measures 3.6 cm in size, upper   normal LA size, mildly dilated RA, aneurysmal IAS - PFO  Cannot be   excluded.  EKG:  EKG is  ordered today.  The ekg ordered today demonstrates normal sinus rhythm, the pattern is of LVH but voltage lacking due to obesity, typical secondary repolarization abnormality  Recent Labs: 09/20/2018: Hemoglobin 13.5; Platelets 286; TSH 1.270 03/23/2019: ALT 12; BUN 10; Creatinine, Ser 0.70; Potassium 4.2; Sodium 144  Recent Lipid Panel    Component Value Date/Time   CHOL 161 03/23/2019 0945   TRIG 105 03/23/2019 0945   HDL 51 03/23/2019 0945   CHOLHDL 3.2 03/23/2019 0945   LDLCALC 91 03/23/2019 0945    Physical Exam:    VS:  BP (!) 161/80   Pulse (!) 57   Temp (!) 96.6 F (35.9 C)   Ht 5\' 7"  (1.702 m)   Wt 213 lb (96.6 kg)   LMP 10/29/2014   SpO2 98%   BMI 33.36 kg/m     Wt Readings from Last 3 Encounters:  06/13/19 213 lb (96.6 kg)  04/13/19 205 lb (93 kg)  03/23/19 205 lb (93 kg)     GEN: mildly obese, well nourished, well developed in no acute distress HEENT: Normal NECK: No JVD; No carotid bruits LYMPHATICS: No lymphadenopathy  CARDIAC: RRR, no murmurs, rubs. S4 gallop is present RESPIRATORY:  Clear to auscultation without rales, wheezing or rhonchi  ABDOMEN: Soft, non-tender, non-distended MUSCULOSKELETAL:  No edema; No deformity  SKIN: Warm and dry NEUROLOGIC:  Alert and oriented x 3 PSYCHIATRIC:  Normal affect   ASSESSMENT:    1. Proximal aortic dissection ([place])   2. History of aortic valve repair   3. Essential hypertension   4. Hypercholesterolemia    PLAN:    In order of problems listed above:  1. Aortic dissection: Persistent false lumen from the mid thoracic aorta to the abdomen, asymptomatic.  The ascending aorta has decompressed and is now normal in size surrounding the Hemashield graft.  Will need yearly monitoring, especially to review the diameter of the unrepaired aorta (3.8 cm at the upper margin of the abdominal aorta).  Future angiography should include the abdominal and pelvic vessels. 2. AV repair: Valve was resuspended.  No clinical findings to suggest aortic insufficiency.  I suggested an echocardiogram, but at this point she does not have insurance coverage and we will delay this. 3. HTN: In her systolic blood pressure consistently less than 130 is critical.  She is on a high dose of beta-blocker as well as ACE inhibitor and amlodipine.  She also takes a loop diuretic.  She does not have heart failure.  Consider switching from furosemide to chlorthalidone for better blood pressure reduction. 4. HLP: He does not have known atherosclerosis, but ideally her LDL should be less than 70.  No indication to change his statin dose at this time, but encouraged changes in diet and weight loss.   Medication Adjustments/Labs and Tests Ordered: Current medicines are reviewed at length with the patient today.  Concerns regarding medicines are outlined above.  Orders Placed This Encounter  Procedures  . EKG 12-Lead   Meds ordered this encounter  Medications  . aspirin EC 81 MG tablet    Sig: Take 1  tablet (81 mg total) by mouth daily.    Dispense:  90 tablet    Refill:  3    Patient Instructions  Medication Instructions:  DECREASE the Aspirin to 81 mg once daily  *If you need a refill on your cardiac medications before your next appointment, please call your pharmacy*  Lab Work:  None ordered If you have labs (blood work) drawn today and your tests are completely normal, you will receive your results only by: Marland Kitchen MyChart Message (if you have MyChart) OR . A paper copy in the mail If you have any lab test that is abnormal or we need to change your treatment, we will call you to review the results.  Testing/Procedures: None ordered  Follow-Up: At [facility], you and your health needs are our priority.  As part of our continuing mission to provide you with exceptional heart care, we have created designated Provider Care Teams.  These Care Teams include your primary Cardiologist (physician) and Advanced Practice Providers (APPs -  Physician Assistants and Nurse Practitioners) who all work together to provide you with the care you need, when you need it.  Your next appointment:   12 month(s)  The format for your next appointment:   In Person  Provider:   Sanda Klein, MD   Dr. Sallyanne Kuster would like you to check your blood pressure DAILY for the next few weeks.  Keep a journal of these daily BP and heart rate readings and call our office or send a message through [place] with the results.         Signed, Sanda Klein, MD  06/18/2019 10:32 AM    [facility]

## 2019-06-18 ENCOUNTER — Encounter: Payer: Self-pay | Admitting: Cardiovascular Disease

## 2019-09-25 ENCOUNTER — Telehealth: Payer: Self-pay | Admitting: Family Medicine

## 2019-09-25 ENCOUNTER — Ambulatory Visit
Admission: EM | Admit: 2019-09-25 | Discharge: 2019-09-25 | Disposition: A | Discharge status: Home / Self Care | Payer: Self-pay

## 2019-09-25 DIAGNOSIS — I10 Essential (primary) hypertension: Secondary | ICD-10-CM

## 2019-09-25 DIAGNOSIS — Z95828 Presence of other vascular implants and grafts: Secondary | ICD-10-CM

## 2019-09-25 MED ORDER — FUROSEMIDE 20 MG PO TABS: 20.0000 mg | ORAL_TABLET | Freq: Every day | ORAL | 0 refills | Status: DC

## 2019-09-25 MED ORDER — CARVEDILOL 25 MG PO TABS: 25.0000 mg | ORAL_TABLET | Freq: Two times a day (BID) | ORAL | 0 refills | Status: DC

## 2019-09-25 MED ORDER — SIMVASTATIN 20 MG PO TABS: 20.0000 mg | ORAL_TABLET | Freq: Every day | ORAL | 0 refills | Status: DC

## 2019-09-25 MED ORDER — POTASSIUM CHLORIDE ER 10 MEQ PO TBCR: 10.0000 meq | EXTENDED_RELEASE_TABLET | Freq: Every day | ORAL | 0 refills | Status: DC

## 2019-09-25 MED ORDER — LISINOPRIL 40 MG PO TABS: 40.0000 mg | ORAL_TABLET | Freq: Every day | ORAL | 0 refills | Status: DC

## 2019-09-25 MED ORDER — AMLODIPINE BESYLATE 10 MG PO TABS: 10.0000 mg | ORAL_TABLET | Freq: Every day | ORAL | 0 refills | Status: DC

## 2019-09-25 NOTE — Telephone Encounter (Signed)
1 month supply sent to patient's pharmacy.  Patient was supposed to follow up in 06/2019. Please make patient a follow up visit prior to when she'll need refills again. Will be virtual appointment, will bring her in for labs if we need to repeat them.

## 2019-09-25 NOTE — Telephone Encounter (Signed)
1) Medication(s) Requested (by name): amLODipine (NORVASC) 10 MG tablet [id number]  carvedilol (COREG) 25 MG tablet [[id number]]  furosemide (LASIX) 20 MG tablet [id number]  lisinopril (ZESTRIL) 40 MG tablet [id number]  potassium chloride (K-DUR) 10 MEQ tablet [[id number]] simvastatin (ZOCOR) 20 MG tablet [id number]   2) Pharmacy of Choice: [facility] [address] [place], [place].  [place] [place] [address]     Approved medications will be sent to pharmacy, we will reach out to you if there is an issue.  Requests made after 3pm may not be addressed until following business day!

## 2019-09-27 ENCOUNTER — Ambulatory Visit: Payer: Self-pay | Attending: Internal Medicine

## 2019-09-27 DIAGNOSIS — Z23 Encounter for immunization: Secondary | ICD-10-CM

## 2019-09-27 NOTE — Progress Notes (Signed)
   Covid-19 Vaccination Clinic  Name:  Ashley Guerrero    MRN: [id number] DOB: 03/15/1966  09/27/2019  [name] was observed post Covid-19 immunization for 30 minutes based on pre-vaccination screening without incident. She was provided with Vaccine Information Sheet and instruction to access the V-Safe system.   [name] was instructed to call 911 with any severe reactions post vaccine: Marland Kitchen Difficulty breathing  . Swelling of face and throat  . A fast heartbeat  . A bad rash all over body  . Dizziness and weakness   Immunizations Administered    Name Date Dose VIS Date Route   Pfizer COVID-19 Vaccine 09/27/2019  4:05 PM 0.3 mL 06/16/2019 Intramuscular   Manufacturer: [place]   Lot: [id number]   [place]: [id number]    .

## 2019-10-18 ENCOUNTER — Ambulatory Visit: Payer: Self-pay | Attending: Internal Medicine

## 2019-10-18 DIAGNOSIS — Z23 Encounter for immunization: Secondary | ICD-10-CM

## 2019-10-18 NOTE — Progress Notes (Signed)
   Covid-19 Vaccination Clinic  Name:  Ashley Guerrero    MRN: [id number] DOB: 09-Apr-1966  10/18/2019  Ashley Guerrero was observed post Covid-19 immunization for 15 minutes without incident. She was provided with Vaccine Information Sheet and instruction to access the V-Safe system. Left the observation area without checking out, attempted to call patient several times, no answer.  Ashley Guerrero was instructed to call 911 with any severe reactions post vaccine: Marland Kitchen Difficulty breathing  . Swelling of face and throat  . A fast heartbeat  . A bad rash all over body  . Dizziness and weakness   Immunizations Administered    Name Date Dose VIS Date Route   Pfizer COVID-19 Vaccine 10/18/2019  4:36 PM 0.3 mL 06/16/2019 Intramuscular   Manufacturer: [place]   Lot: [id number]   [place]: [id number]

## 2019-10-30 ENCOUNTER — Telehealth (INDEPENDENT_AMBULATORY_CARE_PROVIDER_SITE_OTHER): Payer: Self-pay | Admitting: Internal Medicine

## 2019-10-30 ENCOUNTER — Encounter: Payer: Self-pay | Admitting: Internal Medicine

## 2019-10-30 DIAGNOSIS — I10 Essential (primary) hypertension: Secondary | ICD-10-CM

## 2019-10-30 DIAGNOSIS — Z95828 Presence of other vascular implants and grafts: Secondary | ICD-10-CM

## 2019-10-30 DIAGNOSIS — E785 Hyperlipidemia, unspecified: Secondary | ICD-10-CM

## 2019-10-30 DIAGNOSIS — M2061 Acquired deformities of toe(s), unspecified, right foot: Secondary | ICD-10-CM

## 2019-10-30 MED ORDER — SIMVASTATIN 20 MG PO TABS: 20.0000 mg | ORAL_TABLET | Freq: Every day | ORAL | 1 refills | Status: DC

## 2019-10-30 MED ORDER — LISINOPRIL 40 MG PO TABS: 40.0000 mg | ORAL_TABLET | Freq: Every day | ORAL | 1 refills | Status: DC

## 2019-10-30 MED ORDER — POTASSIUM CHLORIDE ER 10 MEQ PO TBCR: 10.0000 meq | EXTENDED_RELEASE_TABLET | Freq: Every day | ORAL | 1 refills | Status: DC

## 2019-10-30 MED ORDER — FUROSEMIDE 20 MG PO TABS: 20.0000 mg | ORAL_TABLET | Freq: Every day | ORAL | 1 refills | Status: DC

## 2019-10-30 MED ORDER — CARVEDILOL 25 MG PO TABS: 25.0000 mg | ORAL_TABLET | Freq: Two times a day (BID) | ORAL | 1 refills | Status: DC

## 2019-10-30 MED ORDER — AMLODIPINE BESYLATE 10 MG PO TABS: 10.0000 mg | ORAL_TABLET | Freq: Every day | ORAL | 1 refills | Status: DC

## 2019-10-30 NOTE — Progress Notes (Signed)
Virtual Visit via Telephone Note  I connected with [employer], on 10/30/2019 at 3:53 PM by telephone due to the COVID-19 pandemic and verified that I am speaking with the correct person using two identifiers.   Consent: I discussed the limitations, risks, security and privacy concerns of performing an evaluation and management service by telephone and the availability of in person appointments. I also discussed with the patient that there may be a patient responsible charge related to this service. The patient expressed understanding and agreed to proceed.   Location of Patient: Home   Location of Provider: Clinic    Persons participating in Telemedicine visit: [facility] Dr. Juleen China      History of Present Illness: Patient has a visit to f/u on HTN.   Chronic HTN Disease Monitoring:  Home BP Monitoring - 116/70 this AM and reports this is typical for her  Chest pain- no  Dyspnea- no Headache - no  Medications: Amlodipine 10 mg, Lisinopril 40 mg, Coreg 25 mg BID  Compliance- yes Lightheadedness- no  Edema- no   Denies any swelling, SOB, weight changes.   Patient reports that her right big toe is turning and about to twist over her second toe. She has bunions as well.     Past Medical History:  Diagnosis Date  . Aortic dissection ([place])    Status post repair 5/16  . Chronic diastolic CHF (congestive heart failure) ([place])    Echo 6/16:  Severe LVH, EF 60-65%, no RWMA, Gr 1 DD, LA upper limits of normal, mild RAE, aneurysmal intra-atrial septum  . Fibroid uterus   . Hypertension    Allergies  Allergen Reactions  . Influenza Vaccines     Current Outpatient Medications on File Prior to Visit  Medication Sig Dispense Refill  . amLODipine (NORVASC) 10 MG tablet Take 1 tablet (10 mg total) by mouth daily. 30 tablet 0  . aspirin EC 81 MG tablet Take 1 tablet (81 mg total) by mouth daily. 90 tablet 3  . carvedilol (COREG)  25 MG tablet Take 1 tablet (25 mg total) by mouth 2 (two) times daily with a meal. 60 tablet 0  . furosemide (LASIX) 20 MG tablet Take 1 tablet (20 mg total) by mouth daily. 30 tablet 0  . lisinopril (ZESTRIL) 40 MG tablet Take 1 tablet (40 mg total) by mouth daily. 30 tablet 0  . potassium chloride (KLOR-CON) 10 MEQ tablet Take 1 tablet (10 mEq total) by mouth daily. 30 tablet 0  . simvastatin (ZOCOR) 20 MG tablet Take 1 tablet (20 mg total) by mouth daily. 30 tablet 0   No current facility-administered medications on file prior to visit.    Observations/Objective: NAD. Speaking clearly.  Work of breathing normal.  Alert and oriented. Mood appropriate.   Assessment and Plan: 1. Essential hypertension BP is well controlled and at goal. Continue current medication regimen.  Counseled on blood pressure goal of less than 130/80, low-sodium, DASH diet, medication compliance, 150 minutes of moderate intensity exercise per week. Discussed medication compliance, adverse effects. - amLODipine (NORVASC) 10 MG tablet; Take 1 tablet (10 mg total) by mouth daily.  Dispense: 90 tablet; Refill: 1 - carvedilol (COREG) 25 MG tablet; Take 1 tablet (25 mg total) by mouth 2 (two) times daily with a meal.  Dispense: 180 tablet; Refill: 1 - lisinopril (ZESTRIL) 40 MG tablet; Take 1 tablet (40 mg total) by mouth daily.  Dispense: 90 tablet; Refill: 1 - potassium chloride (KLOR-CON) 10 MEQ  tablet; Take 1 tablet (10 mEq total) by mouth daily.  Dispense: 90 tablet; Refill: 1  2. S/P ascending aortic replacement Denies signs/symptoms of volume overload.  - furosemide (LASIX) 20 MG tablet; Take 1 tablet (20 mg total) by mouth daily.  Dispense: 90 tablet; Refill: 1 - simvastatin (ZOCOR) 20 MG tablet; Take 1 tablet (20 mg total) by mouth daily.  Dispense: 90 tablet; Refill: 1  3. Hyperlipidemia, unspecified hyperlipidemia type Continue statin therapy. Last LDL was within goal in Sept 2020.   4. Acquired deformity  of right toe - Ambulatory referral to Podiatry   Follow Up Instructions: 6 month f/u for annual exam and chronic medical conditions    I discussed the assessment and treatment plan with the patient. The patient was provided an opportunity to ask questions and all were answered. The patient agreed with the plan and demonstrated an understanding of the instructions.   The patient was advised to call back or seek an in-person evaluation if the symptoms worsen or if the condition fails to improve as anticipated.     I provided 12 minutes total of non-face-to-face time during this encounter including median intraservice time, reviewing previous notes, investigations, ordering medications, medical decision making, coordinating care and patient verbalized understanding at the end of the visit.    Phill Myron, D.O. Primary Care at [facility]  10/30/2019, 3:53 PM

## 2020-03-25 ENCOUNTER — Other Ambulatory Visit: Payer: Self-pay | Admitting: Cardiothoracic Surgery

## 2020-03-25 DIAGNOSIS — Z95828 Presence of other vascular implants and grafts: Secondary | ICD-10-CM

## 2020-04-15 ENCOUNTER — Other Ambulatory Visit: Payer: Self-pay

## 2020-04-15 ENCOUNTER — Ambulatory Visit
Admission: RE | Admit: 2020-04-15 | Discharge: 2020-04-15 | Disposition: A | Discharge status: Home / Self Care | Payer: No Typology Code available for payment source | Source: Ambulatory Visit | Attending: Internal Medicine | Admitting: Internal Medicine

## 2020-04-15 DIAGNOSIS — Z1231 Encounter for screening mammogram for malignant neoplasm of breast: Secondary | ICD-10-CM

## 2020-04-18 ENCOUNTER — Inpatient Hospital Stay: Admission: RE | Admit: 2020-04-18 | Payer: Self-pay | Source: Ambulatory Visit

## 2020-04-18 ENCOUNTER — Other Ambulatory Visit: Payer: Self-pay

## 2020-04-18 ENCOUNTER — Ambulatory Visit: Payer: Self-pay | Admitting: Cardiothoracic Surgery

## 2020-04-29 ENCOUNTER — Telehealth: Payer: Self-pay

## 2020-04-29 DIAGNOSIS — Z95828 Presence of other vascular implants and grafts: Secondary | ICD-10-CM

## 2020-04-29 DIAGNOSIS — I10 Essential (primary) hypertension: Secondary | ICD-10-CM

## 2020-04-29 NOTE — Telephone Encounter (Signed)
Patient called to request a medication refill for ALL her medication.   Patient uses   [facility] [address] [place], [place]   Please advice [phone]

## 2020-05-01 MED ORDER — CARVEDILOL 25 MG PO TABS: 25.0000 mg | ORAL_TABLET | Freq: Two times a day (BID) | ORAL | 0 refills | Status: DC

## 2020-05-01 MED ORDER — POTASSIUM CHLORIDE ER 10 MEQ PO TBCR: 10.0000 meq | EXTENDED_RELEASE_TABLET | Freq: Every day | ORAL | 0 refills | Status: DC

## 2020-05-01 MED ORDER — FUROSEMIDE 20 MG PO TABS: 20.0000 mg | ORAL_TABLET | Freq: Every day | ORAL | 0 refills | Status: DC

## 2020-05-01 MED ORDER — AMLODIPINE BESYLATE 10 MG PO TABS: 10.0000 mg | ORAL_TABLET | Freq: Every day | ORAL | 0 refills | Status: DC

## 2020-05-01 MED ORDER — SIMVASTATIN 20 MG PO TABS: 20.0000 mg | ORAL_TABLET | Freq: Every day | ORAL | 0 refills | Status: DC

## 2020-05-01 MED ORDER — LISINOPRIL 40 MG PO TABS: 40.0000 mg | ORAL_TABLET | Freq: Every day | ORAL | 0 refills | Status: DC

## 2020-05-01 NOTE — Telephone Encounter (Signed)
Short supply sent

## 2020-05-08 ENCOUNTER — Encounter: Payer: Self-pay | Admitting: Internal Medicine

## 2020-05-09 ENCOUNTER — Encounter: Payer: Self-pay | Admitting: Internal Medicine

## 2020-05-09 ENCOUNTER — Ambulatory Visit (INDEPENDENT_AMBULATORY_CARE_PROVIDER_SITE_OTHER): Payer: Self-pay | Admitting: Internal Medicine

## 2020-05-09 ENCOUNTER — Other Ambulatory Visit: Payer: Self-pay

## 2020-05-09 VITALS — BP 137/77 | HR 63 | Temp 97.3°F | Resp 17 | Wt 224.0 lb

## 2020-05-09 DIAGNOSIS — I10 Essential (primary) hypertension: Secondary | ICD-10-CM

## 2020-05-09 DIAGNOSIS — Z1211 Encounter for screening for malignant neoplasm of colon: Secondary | ICD-10-CM

## 2020-05-09 DIAGNOSIS — E782 Mixed hyperlipidemia: Secondary | ICD-10-CM

## 2020-05-09 DIAGNOSIS — Z95828 Presence of other vascular implants and grafts: Secondary | ICD-10-CM

## 2020-05-09 DIAGNOSIS — Z1159 Encounter for screening for other viral diseases: Secondary | ICD-10-CM

## 2020-05-09 NOTE — Progress Notes (Signed)
  Subjective:    Ashley Guerrero - 54 y.o. female MRN [id number]  Date of birth: [birth year]  HPI  Ashley Guerrero is here for follow up of chronic medical conditions.  Chronic HTN Disease Monitoring:  Home BP Monitoring - Monitors but says all over the place for her numbers.  Chest pain- no  Dyspnea- no Headache - no  Medications: Coreg 25 mg BID, Amlodipine 10 mg, Lasix 20 mg daily, Lisinopril 40 mg  Compliance- yes Lightheadedness- no  Edema- no         Health Maintenance:  Health Maintenance Due  Topic Date Due  . Hepatitis C Screening  Never done  . COLON CANCER SCREENING ANNUAL FOBT  Never done    -  reports that she has been smoking cigarettes. She started smoking about 5 years ago. She has been smoking about 1.00 pack per day. She has never used smokeless tobacco. - Review of Systems: Per HPI. - Past Medical History: Patient Active Problem List   Diagnosis Date Noted  . Well woman exam with routine gynecological exam 03/02/2019  . S/P ascending aortic replacement 11/04/2014  . Essential hypertension, malignant 11/04/2014  . Large  Abdominal mass- question uterine origin  11/04/2014   - Medications: reviewed and updated   Objective:   Physical Exam BP 137/77   Pulse 63   Temp (!) 97.3 F (36.3 C) (Temporal)   Resp 17   Wt 224 lb (101.6 kg)   LMP 10/29/2014   SpO2 96%   BMI 35.08 kg/m  Physical Exam Constitutional:      General: She is not in acute distress.    Appearance: She is not diaphoretic.  HENT:     Head: Normocephalic and atraumatic.  Eyes:     Conjunctiva/sclera: Conjunctivae normal.  Cardiovascular:     Rate and Rhythm: Normal rate and regular rhythm.     Heart sounds: Normal heart sounds. No murmur heard.   Pulmonary:     Effort: Pulmonary effort is normal. No respiratory distress.     Breath sounds: Normal breath sounds.  Chest:     Comments: Midline linear scar present over sternum.  Musculoskeletal:         General: Normal range of motion.  Skin:    General: Skin is warm and dry.  Neurological:     Mental Status: She is alert and oriented to person, place, and time.  Psychiatric:        Mood and Affect: Affect normal.        Judgment: Judgment normal.            Assessment & Plan:   1. Essential hypertension BP near goal today. Asympomatic. Encouraged continued compliance with regimen. Lab monitoring warranted especially given use of loop diuretic and Ace inhibitor.   - CBC with Differential - Comprehensive metabolic panel - LDL Cholesterol, Direct  2. Mixed hyperlipidemia Last LDL 91. Continue statin and ASA.  - LDL Cholesterol, Direct  3. S/P ascending aortic replacement Has follow up CTA chest and abdomen scheduled for Jan 2022.   4. Need for hepatitis C screening test - HCV Ab w/Rflx to Verification  5. Screening for colon cancer - Fecal occult blood, imunochemical([facility]); Future    Phill Myron, D.O. 05/09/2020, 1:40 PM Primary Care at [facility]

## 2020-05-10 LAB — CBC WITH DIFFERENTIAL/PLATELET
Basophils Absolute: 0 10*3/uL (ref 0.0–0.2)
Basos: 1 %
EOS (ABSOLUTE): 0.2 10*3/uL (ref 0.0–0.4)
Eos: 3 %
Hematocrit: 40.7 % (ref 34.0–46.6)
Hemoglobin: 13.8 g/dL (ref 11.1–15.9)
Immature Grans (Abs): 0 10*3/uL (ref 0.0–0.1)
Immature Granulocytes: 0 %
Lymphocytes Absolute: 2.8 10*3/uL (ref 0.7–3.1)
Lymphs: 47 %
MCH: 31 pg (ref 26.6–33.0)
MCHC: 33.9 g/dL (ref 31.5–35.7)
MCV: 92 fL (ref 79–97)
Monocytes Absolute: 0.5 10*3/uL (ref 0.1–0.9)
Monocytes: 8 %
Neutrophils Absolute: 2.4 10*3/uL (ref 1.4–7.0)
Neutrophils: 41 %
Platelets: 296 10*3/uL (ref 150–450)
RBC: 4.45 x10E6/uL (ref 3.77–5.28)
RDW: 13.2 % (ref 11.7–15.4)
WBC: 5.9 10*3/uL (ref 3.4–10.8)

## 2020-05-10 LAB — LDL CHOLESTEROL, DIRECT: LDL Direct: 99 mg/dL (ref 0–99)

## 2020-05-10 LAB — HCV INTERPRETATION

## 2020-05-10 LAB — COMPREHENSIVE METABOLIC PANEL
ALT: 11 IU/L (ref 0–32)
AST: 13 IU/L (ref 0–40)
Albumin/Globulin Ratio: 1.3 (ref 1.2–2.2)
Albumin: 4.1 g/dL (ref 3.8–4.9)
Alkaline Phosphatase: 121 IU/L (ref 44–121)
BUN/Creatinine Ratio: 19 (ref 9–23)
BUN: 13 mg/dL (ref 6–24)
Bilirubin Total: <0.2 mg/dL (ref 0.0–1.2)
CO2: 25 mmol/L (ref 20–29)
Calcium: 9.1 mg/dL (ref 8.7–10.2)
Chloride: 103 mmol/L (ref 96–106)
Creatinine, Ser: 0.68 mg/dL (ref 0.57–1.00)
GFR calc Af Amer: 115 mL/min/{1.73_m2} (ref >59)
GFR calc non Af Amer: 99 mL/min/{1.73_m2} (ref >59)
Globulin, Total: 3.1 g/dL (ref 1.5–4.5)
Glucose: 91 mg/dL (ref 65–99)
Potassium: 4.1 mmol/L (ref 3.5–5.2)
Sodium: 140 mmol/L (ref 134–144)
Total Protein: 7.2 g/dL (ref 6.0–8.5)

## 2020-05-10 LAB — HCV AB W/RFLX TO VERIFICATION: HCV Ab: <0.1 s/co ratio (ref 0.0–0.9)

## 2020-05-10 NOTE — Progress Notes (Signed)
Normal lab letter mailed.

## 2020-06-03 ENCOUNTER — Other Ambulatory Visit: Payer: Self-pay

## 2020-06-03 DIAGNOSIS — Z95828 Presence of other vascular implants and grafts: Secondary | ICD-10-CM

## 2020-06-03 DIAGNOSIS — I10 Essential (primary) hypertension: Secondary | ICD-10-CM

## 2020-06-03 MED ORDER — CARVEDILOL 25 MG PO TABS: 25.0000 mg | ORAL_TABLET | Freq: Two times a day (BID) | ORAL | 1 refills | Status: DC

## 2020-06-03 MED ORDER — POTASSIUM CHLORIDE ER 10 MEQ PO TBCR: 10.0000 meq | EXTENDED_RELEASE_TABLET | Freq: Every day | ORAL | 1 refills | Status: DC

## 2020-06-03 MED ORDER — LISINOPRIL 40 MG PO TABS: 40.0000 mg | ORAL_TABLET | Freq: Every day | ORAL | 1 refills | Status: DC

## 2020-06-03 MED ORDER — FUROSEMIDE 20 MG PO TABS: 20.0000 mg | ORAL_TABLET | Freq: Every day | ORAL | 1 refills | Status: DC

## 2020-06-03 MED ORDER — AMLODIPINE BESYLATE 10 MG PO TABS: 10.0000 mg | ORAL_TABLET | Freq: Every day | ORAL | 1 refills | Status: DC

## 2020-06-03 MED ORDER — SIMVASTATIN 20 MG PO TABS: 20.0000 mg | ORAL_TABLET | Freq: Every day | ORAL | 1 refills | Status: DC

## 2020-07-11 ENCOUNTER — Ambulatory Visit: Payer: Self-pay | Admitting: Cardiothoracic Surgery

## 2020-07-11 ENCOUNTER — Other Ambulatory Visit: Payer: Self-pay

## 2020-08-29 ENCOUNTER — Other Ambulatory Visit: Payer: Self-pay

## 2020-08-29 DIAGNOSIS — I10 Essential (primary) hypertension: Secondary | ICD-10-CM

## 2020-08-29 DIAGNOSIS — Z95828 Presence of other vascular implants and grafts: Secondary | ICD-10-CM

## 2020-08-29 MED ORDER — LISINOPRIL 40 MG PO TABS: 40.0000 mg | ORAL_TABLET | Freq: Every day | ORAL | 1 refills | Status: DC

## 2020-08-29 MED ORDER — CARVEDILOL 25 MG PO TABS: 25.0000 mg | ORAL_TABLET | Freq: Two times a day (BID) | ORAL | 1 refills | Status: DC

## 2020-08-29 MED ORDER — SIMVASTATIN 20 MG PO TABS: 20.0000 mg | ORAL_TABLET | Freq: Every day | ORAL | 1 refills | Status: DC

## 2020-08-29 MED ORDER — POTASSIUM CHLORIDE ER 10 MEQ PO TBCR: 10.0000 meq | EXTENDED_RELEASE_TABLET | Freq: Every day | ORAL | 1 refills | Status: DC

## 2020-08-29 MED ORDER — AMLODIPINE BESYLATE 10 MG PO TABS: 10.0000 mg | ORAL_TABLET | Freq: Every day | ORAL | 1 refills | Status: DC

## 2020-08-29 MED ORDER — FUROSEMIDE 20 MG PO TABS: 20.0000 mg | ORAL_TABLET | Freq: Every day | ORAL | 1 refills | Status: DC

## 2020-08-29 NOTE — Progress Notes (Signed)
Prescription request completed.

## 2020-09-04 ENCOUNTER — Ambulatory Visit
Admission: RE | Admit: 2020-09-04 | Discharge: 2020-09-04 | Disposition: A | Discharge status: Home / Self Care | Payer: Self-pay | Source: Ambulatory Visit | Attending: Cardiothoracic Surgery | Admitting: Cardiothoracic Surgery

## 2020-09-04 DIAGNOSIS — Z95828 Presence of other vascular implants and grafts: Secondary | ICD-10-CM

## 2020-09-04 MED ORDER — IOPAMIDOL (ISOVUE-370) INJECTION 76%
75.0000 mL | Freq: Once | INTRAVENOUS | Status: AC | PRN
  Administered 2020-09-04: 75 mL via INTRAVENOUS

## 2020-09-05 ENCOUNTER — Other Ambulatory Visit: Payer: Self-pay

## 2020-09-05 ENCOUNTER — Ambulatory Visit (INDEPENDENT_AMBULATORY_CARE_PROVIDER_SITE_OTHER): Payer: 59 | Admitting: Cardiothoracic Surgery

## 2020-09-05 VITALS — BP 144/86 | HR 63 | Resp 20 | Wt 233.0 lb

## 2020-09-05 DIAGNOSIS — Z95828 Presence of other vascular implants and grafts: Secondary | ICD-10-CM

## 2020-09-05 NOTE — Progress Notes (Signed)
[address]       [place] [address]             [phone]      Ashley Guerrero [place] Medical Record #[id number] Date of Birth: 1965-11-03  Referring: Darlin Coco, MD Primary Care: Nicolette Bang, DO Primary Cardiologist: Dr. Sallyanne Kuster   Chief Complaint:   POST OP FOLLOW UP DATE OF PROCEDURE:  11/03/2014 OPERATIVE REPORT PREOPERATIVE DIAGNOSIS:  Acute type 1 aortic dissection. POSTOPERATIVE DIAGNOSIS:  Acute type 1 aortic dissection. SURGICAL PROCEDURE:  Repair of ascending aortic dissection with replacement of ascending aorta with 30 mm Hemashield graft and resuspension of the aortic valve with hypothermic circulatory arrest and cardiopulmonary bypass. SURGEON:  Lanelle Bal, MD  History of Present Illness:     Patient originally presented in April 2016 with acute aortic dissection.  At that time she was visiting [place] for her ill father but living in [place].  Following surgery she returned to [place].  She now returns to reestablish care now that she is living full-time in [place].  She notes that no follow-up studies were done after her return to [place] other than the EKG.   She was originally seen by Dr. Mare Ferrari in 2016 postop.     Past Medical History:  Diagnosis Date  . Aortic dissection ([place])    Status post repair 5/16  . Chronic diastolic CHF (congestive heart failure) ([place])    Echo 6/16:  Severe LVH, EF 60-65%, no RWMA, Gr 1 DD, LA upper limits of normal, mild RAE, aneurysmal intra-atrial septum  . Fibroid uterus   . Hypertension      Social History   Tobacco Use  Smoking Status Current Every Day Smoker  . Packs/day: 1.00  . Types: Cigarettes  . Start date: 11/03/2014  Smokeless Tobacco Never Used    Social History   Substance and Sexual Activity  Alcohol Use Not Currently  . Alcohol/week: 0.0 standard drinks   Comment: occasional     Allergies  Allergen Reactions  .  Influenza Vaccines     Current Outpatient Medications  Medication Sig Dispense Refill  . amLODipine (NORVASC) 10 MG tablet Take 1 tablet (10 mg total) by mouth daily. 90 tablet 1  . aspirin EC 81 MG tablet Take 1 tablet (81 mg total) by mouth daily. 90 tablet 3  . carvedilol (COREG) 25 MG tablet Take 1 tablet (25 mg total) by mouth 2 (two) times daily with a meal. 180 tablet 1  . furosemide (LASIX) 20 MG tablet Take 1 tablet (20 mg total) by mouth daily. 90 tablet 1  . lisinopril (ZESTRIL) 40 MG tablet Take 1 tablet (40 mg total) by mouth daily. 90 tablet 1  . potassium chloride (KLOR-CON) 10 MEQ tablet Take 1 tablet (10 mEq total) by mouth daily. 90 tablet 1  . simvastatin (ZOCOR) 20 MG tablet Take 1 tablet (20 mg total) by mouth daily. 90 tablet 1   No current facility-administered medications for this visit.       Physical Exam: BP (!) 144/86 (BP Location: Left Arm, Patient Position: Sitting)   Pulse 63   Resp 20   Wt 233 lb (105.7 kg)   LMP 10/29/2014   SpO2 93% Comment: RA  BMI 36.49 kg/m  General appearance: alert, cooperative and no distress Head: Normocephalic, without obvious abnormality, atraumatic Neck: no adenopathy, no carotid bruit, no JVD, supple, symmetrical, trachea midline and thyroid not enlarged, symmetric, no  tenderness/mass/nodules Lymph nodes: Cervical, supraclavicular, and axillary nodes normal. Resp: clear to auscultation bilaterally Cardio: regular rate and rhythm, S1, S2 normal, no murmur, click, rub or gallop GI: soft, non-tender; bowel sounds normal; no masses,  no organomegaly Extremities: extremities normal, atraumatic, no cyanosis or edema and Homans sign is negative, no sign of DVT Neurologic: Grossly normal  Diagnostic Studies & Laboratory data:     Recent Radiology Findings:   CT ANGIO CHEST AORTA W/CM & OR WO/CM/CT Angio Abd/Pel w/ and/or w/o  Result Date: 09/04/2020 CLINICAL DATA:  Follow-up aortic dissection EXAM: CT ANGIOGRAPHY CHEST,  ABDOMEN AND PELVIS TECHNIQUE: Non-contrast CT of the chest was initially obtained. Multidetector CT imaging through the chest, abdomen and pelvis was performed using the standard protocol during bolus administration of intravenous contrast. Multiplanar reconstructed images and MIPs were obtained and reviewed to evaluate the vascular anatomy. CONTRAST:  34mL ISOVUE-370 IOPAMIDOL (ISOVUE-370) INJECTION 76% COMPARISON:  04/10/2019, 11/03/2014 FINDINGS: CTA CHEST FINDINGS Cardiovascular: Initial precontrast images show no hyperdense crescent to suggest acute aortic abnormality. Post-contrast images show no stable appearing ascending aorta replacement consistent with the given clinical history. The aortic arch appears within normal limits. In the descending aorta there is evidence of prior dissection with opacification of the distal most aspect of the false lumen. The predominance of the false lumen proximally is thrombosed. The overall appearance is similar to that seen in 2020. No cardiac enlargement is noted. Coronary calcifications are seen. The pulmonary artery as visualized is within normal limits. Left vertebral artery arises directly from the aortic arch. Mediastinum/Nodes: Thoracic inlet is within normal limits. No sizable hilar or mediastinal adenopathy is noted. The esophagus as visualized is within normal limits. Lungs/Pleura: Lungs are well aerated bilaterally. Mild scarring in the left lower lobe is seen. Mild emphysematous changes are noted. No sizable effusion is noted. Musculoskeletal: Degenerative changes of the thoracic spine are seen. Review of the MIP images confirms the above findings. CTA ABDOMEN AND PELVIS FINDINGS VASCULAR Aorta: The aortic dissection extends into the abdominal aorta and subsequently into the left common iliac artery terminating at the level of the iliac bifurcation. Celiac: Celiac axis arises from both the true and false lumen with the dissection flap extending into the most  proximal aspect of the celiac axis. SMA: Arises from both the true and false lumen with the known dissection flap extending deep into the superior mesenteric artery Renals: Single renal arteries are identified bilaterally. The left renal artery arises from the false lumen with the right renal artery arising from the true lumen. IMA: Arises from the true lumen and is widely patent. Iliacs: Iliacs are widely patent bilaterally. The dissection flap extends into the left common iliac artery and terminates at the level of the iliac bifurcation on the left. These changes are similar to that noted on prior CT examination from 2016. Hypertrophied uterine arteries are identified bilaterally. Veins: No specific venous abnormality is noted. Review of the MIP images confirms the above findings. NON-VASCULAR Hepatobiliary: No focal liver abnormality is seen. No gallstones, gallbladder wall thickening, or biliary dilatation. Pancreas: Unremarkable. No pancreatic ductal dilatation or surrounding inflammatory changes. Spleen: Normal in size without focal abnormality. Adrenals/Urinary Tract: Adrenal glands are within normal limits. Kidneys demonstrate a normal enhancement pattern bilaterally. Mild scarring is seen in the left kidney. No obstructive changes are noted. The bladder is well distended. Stomach/Bowel: Mild diverticular change of the colon is noted. No findings to suggest diverticulitis are seen. The appendix is not well visualized. No inflammatory  changes to suggest appendicitis are seen. The small bowel and stomach are within normal limits. Lymphatic: No significant lymphadenopathy is noted. Reproductive: The uterus is significantly enlarged and bulky similar to that seen on prior exams consistent with fibroid uterus. This measures at least 25 cm in greatest transverse dimension. It is significantly displaces the large and small bowel from the pelvis. Some scattered calcifications are noted within. No definitive adnexal  lesion is seen. Other: No abdominal wall hernia or abnormality. No abdominopelvic ascites. Musculoskeletal: No acute or significant osseous findings. Review of the MIP images confirms the above findings. IMPRESSION: Changes consistent with ascending aorta replacement with tube graft stable in appearance when compared with the prior exam from 2020. 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 Visceral origins are as described above. Enlarged uterus consistent with fibroid change stable in appearance from the prior exam from 2016. Diverticulosis without diverticulitis. Electronically Signed   By: Inez Catalina M.D.   On: 09/04/2020 09:31    I have independently reviewed the above radiology studies  and reviewed the findings with the patient.   Recent Lab Findings: Lab Results  Component Value Date   WBC 5.9 05/09/2020   HGB 13.8 05/09/2020   HCT 40.7 05/09/2020   PLT 296 05/09/2020   GLUCOSE 91 05/09/2020   CHOL 161 03/23/2019   TRIG 105 03/23/2019   HDL 51 03/23/2019   LDLDIRECT 99 05/09/2020   LDLCALC 91 03/23/2019   ALT 11 05/09/2020   AST 13 05/09/2020   NA 140 05/09/2020   K 4.1 05/09/2020   CL 103 05/09/2020   CREATININE 0.68 05/09/2020   BUN 13 05/09/2020   CO2 25 05/09/2020   TSH 1.270 09/20/2018   INR 1.36 11/04/2014   HGBA1C 5.6 09/20/2018      Assessment / Plan:   Follow-up visit from acute aortic dissection with persistent false lumen in the descending thoracic aorta, maximum diameter of 3.8 cm, no evidence of aortic insufficiency on physical exam-I recommended the patient have follow-up visit with cardiology and consider follow up echocardiogram -  now that she is living in [place].  We will plan to see her back with a CTA of the chest abdomen and pelvis in 1 year I stressed to her the importance of maintaining good long-term blood pressure  control.   Medication Changes: No orders of the defined types were placed in this encounter.     [name] MD      [address] [place] [address] Office [phone]   [place] [phone]  09/05/2020 1:58 PM

## 2020-09-09 ENCOUNTER — Ambulatory Visit (INDEPENDENT_AMBULATORY_CARE_PROVIDER_SITE_OTHER): Payer: 59 | Admitting: Internal Medicine

## 2020-09-09 ENCOUNTER — Other Ambulatory Visit: Payer: Self-pay

## 2020-09-09 ENCOUNTER — Encounter: Payer: Self-pay | Admitting: Internal Medicine

## 2020-09-09 VITALS — BP 154/82 | HR 54 | Temp 98.2°F | Resp 18 | Ht 66.0 in | Wt 233.4 lb

## 2020-09-09 DIAGNOSIS — Z1211 Encounter for screening for malignant neoplasm of colon: Secondary | ICD-10-CM | POA: Diagnosis not present

## 2020-09-09 DIAGNOSIS — I10 Essential (primary) hypertension: Secondary | ICD-10-CM
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
## 2020-11-08 ENCOUNTER — Encounter: Payer: Self-pay | Admitting: Cardiovascular Disease

## 2020-11-08 ENCOUNTER — Ambulatory Visit (INDEPENDENT_AMBULATORY_CARE_PROVIDER_SITE_OTHER): Payer: 59 | Admitting: Cardiovascular Disease

## 2020-11-08 ENCOUNTER — Other Ambulatory Visit: Payer: Self-pay

## 2020-11-08 VITALS — BP 152/82 | HR 57 | Ht 66.0 in | Wt 231.4 lb

## 2020-11-08 DIAGNOSIS — E7800 Pure hypercholesterolemia, unspecified: Secondary | ICD-10-CM

## 2020-11-08 DIAGNOSIS — I10 Essential (primary) hypertension: Secondary | ICD-10-CM

## 2020-11-08 DIAGNOSIS — Z95828 Presence of other vascular implants and grafts: Secondary | ICD-10-CM | POA: Diagnosis not present

## 2020-11-08 DIAGNOSIS — Z8679 Personal history of other diseases of the circulatory system: Secondary | ICD-10-CM | POA: Diagnosis not present

## 2020-11-08 DIAGNOSIS — Z9889 Other specified postprocedural states: Secondary | ICD-10-CM

## 2020-11-08 MED ORDER — FUROSEMIDE 20 MG PO TABS: 20.0000 mg | ORAL_TABLET | ORAL | 1 refills | Status: AC | PRN

## 2020-11-08 MED ORDER — HYDROCHLOROTHIAZIDE 12.5 MG PO CAPS: 12.5000 mg | ORAL_CAPSULE | Freq: Every day | ORAL | 3 refills | Status: DC

## 2020-11-08 NOTE — Patient Instructions (Addendum)
Medication Instructions:  START Hydrochlorothiazide 12.5 mg once daily TAKE the Furosemide 20 mg daily as needed  *If you need a refill on your cardiac medications before your next appointment, please call your pharmacy*   Lab Work: Your provider would like for you to return in 6 weeks to have the following labs drawn: BMET and Magnesium. You do not need an appointment for the lab. Once in our office lobby there is a podium where you can sign in and ring the doorbell to alert [place] that you are here. The lab is open from 8:00 am to 4:30 pm; closed for lunch from 12:45pm-1:45pm.  If you have labs (blood work) drawn today and your tests are completely normal, you will receive your results only by: Marland Kitchen MyChart Message (if you have MyChart) OR . A paper copy in the mail If you have any lab test that is abnormal or we need to change your treatment, we will call you to review the results.   Testing/Procedures: None ordered   Follow-Up: At [facility], you and your health needs are our priority.  As part of our continuing mission to provide you with exceptional heart care, we have created designated Provider Care Teams.  These Care Teams include your primary Cardiologist (physician) and Advanced Practice Providers (APPs -  Physician Assistants and Nurse Practitioners) who all work together to provide you with the care you need, when you need it.  We recommend signing up for the patient portal called "MyChart".  Sign up information is provided on this After Visit Summary.  MyChart is used to connect with patients for Virtual Visits (Telemedicine).  Patients are able to view lab/test results, encounter notes, upcoming appointments, etc.  Non-urgent messages can be sent to your provider as well.   To learn more about what you can do with MyChart, go to [email or web address].    Your next appointment:   10 month(s)  The format for your next appointment:   In Person  Provider:   You may see  Sanda Klein, MD or one of the following Advanced Practice Providers on your designated Care Team:    Almyra Deforest, PA-C  Fabian Sharp, [place] or   Roby Lofts, [place]    Other Instructions Dr. Sallyanne Kuster would like you to check your blood pressure daily for the next 2 weeks.  Keep a journal of these daily blood pressure and heart rate readings and call our office or send a message through [place] with the results. Thank you!  It is best to check your BP 1-2 hours after taking your medications to see the medications effectiveness on your BP.    Here are some tips that our clinical pharmacists share for home BP monitoring:          Rest 10 minutes before taking your blood pressure.          Don't smoke or drink caffeinated beverages for at least 30 minutes before.          Take your blood pressure before (not after) you eat.          Sit comfortably with your back supported and both feet on the floor (don't cross your legs).          Elevate your arm to heart level on a table or a desk.          Use the proper sized cuff. It should fit smoothly and snugly around your bare upper arm. There should be enough room to  slip a fingertip under the cuff. The bottom edge of the cuff should be 1 inch above the crease of the elbow.

## 2020-11-08 NOTE — Progress Notes (Signed)
Cardiology Office Note:    Date:  11/10/2020   ID:  [name], DOB [birth year], MRN [id number]  PCP:  Nicolette Bang, DO  Cardiologist:  Sanda Klein, MD  Electrophysiologist:  None   Referring MD: [name], MD   No chief complaint on file.   History of Present Illness:    Ashley Guerrero is a 55 y.o. female with a hx of acute type I aortic dissection in 2016, treated with emergency surgery and placement of a 30 mm Hemashield graft and resuspension of the aortic valve.  Her dissection extended down the descending aorta all the way to the left common iliac artery.  She just had a CT angiogram of the aorta performed in March 2022 showing stable changes.  The proximal portion of the false lumen is mostly thrombosed.  Her echocardiogram in 2016 showed preserved LVEF and severe left ventricular hypertrophy.  She bears a diagnosis of chronic diastolic heart failure, but has not had symptoms of heart failure on a very low-dose of loop diuretic.  As before, in the distal aorta the dissection flap continues to the level of the left common iliac bifurcation.  The dissection flap also extends into the celiac artery and the superior mesenteric artery.  The left renal artery arises from the false lumen while the right renal artery arises from the true lumen.  She is taking high doses of amlodipine and carvedilol and lisinopril as well as a very low-dose of loop diuretic.  In the past these used to control her blood pressure well, but recently her blood pressure has typically been in the 140s/65-78 range.  Her systolic blood pressure is never less than 130.  The patient specifically denies any chest pain at rest exertion, dyspnea at rest or with exertion, orthopnea, paroxysmal nocturnal dyspnea, syncope, palpitations, focal neurological deficits, intermittent claudication, lower extremity edema, unexplained weight gain, cough, hemoptysis or wheezing.   Past Medical History:   Diagnosis Date  . Aortic dissection ([place])    Status post repair 5/16  . Chronic diastolic CHF (congestive heart failure) ([place])    Echo 6/16:  Severe LVH, EF 60-65%, no RWMA, Gr 1 DD, LA upper limits of normal, mild RAE, aneurysmal intra-atrial septum  . Fibroid uterus   . Hypertension     Past Surgical History:  Procedure Laterality Date  . REPLACEMENT ASCENDING AORTA N/A 11/03/2014   Procedure: REPLACEMENT ASCENDING AORTA with a 32mm hemashield graft,  resuspension of aortic valve, hypothermic circulatory arrest and cardiopulmonary bypass.;  Surgeon: [name], MD;  Location: [facility];  Service: Open Heart Surgery;  Laterality: N/A;    Current Medications: Current Meds  Medication Sig  . amLODipine (NORVASC) 10 MG tablet Take 1 tablet (10 mg total) by mouth daily.  Marland Kitchen aspirin EC 81 MG tablet Take 1 tablet (81 mg total) by mouth daily.  . carvedilol (COREG) 25 MG tablet Take 1 tablet (25 mg total) by mouth 2 (two) times daily with a meal.  . hydrochlorothiazide (MICROZIDE) 12.5 MG capsule Take 1 capsule (12.5 mg total) by mouth daily.  Marland Kitchen lisinopril (ZESTRIL) 40 MG tablet Take 1 tablet (40 mg total) by mouth daily.  . potassium chloride (KLOR-CON) 10 MEQ tablet Take 1 tablet (10 mEq total) by mouth daily.  . simvastatin (ZOCOR) 20 MG tablet Take 1 tablet (20 mg total) by mouth daily.  . [DISCONTINUED] furosemide (LASIX) 20 MG tablet Take 1 tablet (20 mg total) by mouth daily.     Allergies:  Influenza vaccines   Social History   Socioeconomic History  . Marital status: Single    Spouse name: Not on file  . Number of children: 0  . Years of education: Not on file  . Highest education level: Some college, no degree  Occupational History  . Not on file  Tobacco Use  . Smoking status: Current Some Day Smoker    Packs/day: 1.00    Types: Cigarettes    Start date: 11/03/2014  . Smokeless tobacco: Never Used  Vaping Use  . Vaping Use: Never used  Substance and Sexual  Activity  . Alcohol use: Not Currently    Alcohol/week: 0.0 standard drinks    Comment: occasional  . Drug use: Not Currently    Types: Marijuana  . Sexual activity: Not Currently  Other Topics Concern  . Not on file  Social History Narrative  . Not on file   Social Determinants of Health   Financial Resource Strain: Not on file  Food Insecurity: Not on file  Transportation Needs: Not on file  Physical Activity: Not on file  Stress: Not on file  Social Connections: Not on file     Family History: The patient's family history includes Brain cancer in her mother; Breast cancer in her maternal aunt, maternal grandmother, mother, paternal aunt, and paternal grandmother; Cancer in her sister; Diabetes in her father; Heart failure in her father; Hyperlipidemia in her father; Hypertension in her brother, father, mother, and sister; Stroke in her father. There is no history of Heart attack.  ROS:   Please see the history of present illness.     All other systems reviewed and are negative.  EKGs/Labs/Other Studies Reviewed:    The following studies were reviewed today:  CT angiogram of the chest 09/04/2020 Changes consistent with ascending aorta replacement with tube graft stable in appearance when compared with the prior exam from 2020.  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 Visceral origins are as described above.  Enlarged uterus consistent with fibroid change stable in appearance from the prior exam from 2016.  Diverticulosis without diverticulitis.   CT angiogram of the chest 04/10/2019 IMPRESSION: 1. Surgical changes at the level of the ascending aorta, compatible with the given history of previous dissection repair. 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 No residual dissection within the ascending thoracic aorta or aortic arch. 2. Expected evolution of the chronic aortic dissection within the descending thoracic aorta extending into the upper portion of the abdominal aorta (incompletely imaged), with commensurate aneurysm measuring 3.8 cm diameter, with patency of both the true and false lumens. 3. No acute findings.  Lungs are clear.  Echocardiogram 2016 - Left ventricle: The cavity size was normal. There was severe   concentric hypertrophy. Systolic function was normal. The   estimated ejection fraction was in the range of 60% to 65%. Wall   motion was normal; there were no regional wall motion   abnormalities. Doppler parameters are consistent with abnormal   left ventricular relaxation (grade 1 diastolic dysfunction). The   E/e&' ratio is between 8-15, suggseting indeterminate LV Filling   pressure. - Mitral valve: Calcified annulus. Mildly thickened leaflets . - Left atrium: The atrium was at the upper limits of normal in   size. -  Right atrium: The atrium was mildly dilated. - Atrial septum: Aneurysmal IAS - cannot exclude PFO.  Impressions:  - LVEF 60-65%, severe LVH, diastolic dysfunction, indeterminate LV   filling pressure, ascending aorta measures 3.6 cm in size, upper   normal LA size, mildly dilated RA, aneurysmal IAS - PFO Cannot be   excluded.  EKG:  EKG is  ordered today.  It shows sinus rhythm and typical changes of LVH but with voltage reduced due to the presence of obesity.  Recent Labs: 05/09/2020: ALT 11; BUN 13; Creatinine, Ser 0.68; Hemoglobin 13.8; Platelets 296; Potassium 4.1; Sodium 140  Recent Lipid Panel    Component Value Date/Time   CHOL 161 03/23/2019 0945   TRIG 105 03/23/2019 0945   HDL 51 03/23/2019 0945   CHOLHDL 3.2 03/23/2019 0945   LDLCALC 91 03/23/2019 0945   LDLDIRECT 99 05/09/2020 1357    Physical Exam:     VS:  BP (!) 152/82 (BP Location: Left Arm, Patient Position: Sitting, Cuff Size: Normal)   Pulse (!) 57   Ht 5\' 6"  (1.676 m)   Wt 231 lb 6.4 oz (105 kg)   LMP 10/29/2014   BMI 37.35 kg/m     Wt Readings from Last 3 Encounters:  11/08/20 231 lb 6.4 oz (105 kg)  09/09/20 233 lb 6.4 oz (105.9 kg)  09/05/20 233 lb (105.7 kg)     General: Alert, oriented x3, no distress, severely obese Head: no evidence of trauma, PERRL, EOMI, no exophtalmos or lid lag, no myxedema, no xanthelasma; normal ears, nose and oropharynx Neck: normal jugular venous pulsations and no hepatojugular reflux; brisk carotid pulses without delay and no carotid bruits Chest: clear to auscultation, no signs of consolidation by percussion or palpation, normal fremitus, symmetrical and full respiratory excursions Cardiovascular: normal position and quality of the apical impulse, regular rhythm, normal first and second heart sounds, no murmurs, rubs or gallops Abdomen: no tenderness or distention, no masses by palpation, no abnormal pulsatility or arterial bruits, normal bowel sounds, no hepatosplenomegaly Extremities: no clubbing, cyanosis or edema; 2+ radial, ulnar and brachial pulses bilaterally; 2+ right femoral, posterior tibial and dorsalis pedis pulses; 2+ left femoral, posterior tibial and dorsalis pedis pulses; no subclavian or femoral bruits Neurological: grossly nonfocal Psych: Normal mood and affect   ASSESSMENT:    1. History of aortic dissection   2. S/P ascending aortic replacement   3. History of aortic valve repair   4. Essential hypertension   5. Hypercholesterolemia    PLAN:    In order of problems listed above:  1. Aortic dissection: No changes in anatomy over the last several years with persistent false lumen from the mid thoracic aorta to the left iliac artery, also extending into the celiac and superior mesenteric artery, with left renal artery arising from the false lumen.  Asymptomatic.   Since the aorta appears to be relatively stable over time, we will plan to reevaluate every other year: Slightly increased diameter of the unrepaired aorta (3.8 cm at the upper margin of the abdominal aorta).   2. AV repair: Resuspended the aortic valve was functioning normally in 2016.  At some point we should reevaluate this by echo.  Clinically there is no evidence of aortic insufficiency. 3. HTN: Her blood pressure is too high in the setting of a history of aortic dissection.  Continue the amlodipine, carvedilol and lisinopril, all of which are at maximum doses.  The switch from furosemide to hydrochlorothiazide which should provide improved blood  pressure reduction.  If she develops edema we can administer furosemide intermittently.  Moderate aerobic exercises encouraged, intense isometric exercises discouraged.  Weight loss would be highly beneficial. 4. HLP: Minor coronary calcifications are described in her coronary arteries but she has not had clinically meaningful atherosclerosis.  Most recent LDL cholesterol level that I have on file was 91 which I think is acceptable.  Continue simvastatin.   Medication Adjustments/Labs and Tests Ordered: Current medicines are reviewed at length with the patient today.  Concerns regarding medicines are outlined above.  Orders Placed This Encounter  Procedures  . Basic metabolic panel  . Magnesium  . EKG 12-Lead   Meds ordered this encounter  Medications  . hydrochlorothiazide (MICROZIDE) 12.5 MG capsule    Sig: Take 1 capsule (12.5 mg total) by mouth daily.    Dispense:  90 capsule    Refill:  3  . furosemide (LASIX) 20 MG tablet    Sig: Take 1 tablet (20 mg total) by mouth as needed for edema.    Dispense:  90 tablet    Refill:  1    Patient Instructions  Medication Instructions:  START Hydrochlorothiazide 12.5 mg once daily TAKE the Furosemide 20 mg daily as needed  *If you need a refill on your cardiac medications before your next  appointment, please call your pharmacy*   Lab Work: Your provider would like for you to return in 6 weeks to have the following labs drawn: BMET and Magnesium. You do not need an appointment for the lab. Once in our office lobby there is a podium where you can sign in and ring the doorbell to alert [place] that you are here. The lab is open from 8:00 am to 4:30 pm; closed for lunch from 12:45pm-1:45pm.  If you have labs (blood work) drawn today and your tests are completely normal, you will receive your results only by: Marland Kitchen MyChart Message (if you have MyChart) OR . A paper copy in the mail If you have any lab test that is abnormal or we need to change your treatment, we will call you to review the results.   Testing/Procedures: None ordered   Follow-Up: At [facility], you and your health needs are our priority.  As part of our continuing mission to provide you with exceptional heart care, we have created designated Provider Care Teams.  These Care Teams include your primary Cardiologist (physician) and Advanced Practice Providers (APPs -  Physician Assistants and Nurse Practitioners) who all work together to provide you with the care you need, when you need it.  We recommend signing up for the patient portal called "MyChart".  Sign up information is provided on this After Visit Summary.  MyChart is used to connect with patients for Virtual Visits (Telemedicine).  Patients are able to view lab/test results, encounter notes, upcoming appointments, etc.  Non-urgent messages can be sent to your provider as well.   To learn more about what you can do with MyChart, go to [email or web address].    Your next appointment:   10 month(s)  The format for your next appointment:   In Person  Provider:   You may see Sanda Klein, MD or one of the following Advanced Practice Providers on your designated Care Team:    Almyra Deforest, PA-C  Fabian Sharp, [place] or   Roby Lofts, [place]    Other  Instructions Dr. Sallyanne Kuster would like you to check your blood pressure daily for the next 2 weeks.  Keep a journal  of these daily blood pressure and heart rate readings and call our office or send a message through [place] with the results. Thank you!  It is best to check your BP 1-2 hours after taking your medications to see the medications effectiveness on your BP.    Here are some tips that our clinical pharmacists share for home BP monitoring:          Rest 10 minutes before taking your blood pressure.          Don't smoke or drink caffeinated beverages for at least 30 minutes before.          Take your blood pressure before (not after) you eat.          Sit comfortably with your back supported and both feet on the floor (don't cross your legs).          Elevate your arm to heart level on a table or a desk.          Use the proper sized cuff. It should fit smoothly and snugly around your bare upper arm. There should be enough room to slip a fingertip under the cuff. The bottom edge of the cuff should be 1 inch above the crease of the elbow.      Signed, Sanda Klein, MD  11/10/2020 5:40 PM    [facility]

## 2020-11-22 ENCOUNTER — Telehealth: Payer: Self-pay | Admitting: *Deleted

## 2020-11-22 NOTE — Telephone Encounter (Signed)
Call placed to number listed.  Left voicemail : reminded to turn in Cologuard sample.

## 2020-12-10 ENCOUNTER — Other Ambulatory Visit: Payer: Self-pay

## 2020-12-10 ENCOUNTER — Encounter: Payer: Self-pay | Admitting: Internal Medicine

## 2020-12-10 ENCOUNTER — Ambulatory Visit (INDEPENDENT_AMBULATORY_CARE_PROVIDER_SITE_OTHER): Payer: 59 | Admitting: Internal Medicine

## 2020-12-10 VITALS — BP 145/85 | HR 59 | Resp 16 | Wt 228.0 lb

## 2020-12-10 DIAGNOSIS — I10 Essential (primary) hypertension: Secondary | ICD-10-CM | POA: Diagnosis not present

## 2020-12-10 DIAGNOSIS — I7100 Dissection of unspecified site of aorta: Secondary | ICD-10-CM

## 2020-12-10 DIAGNOSIS — F17200 Nicotine dependence, unspecified, uncomplicated: Secondary | ICD-10-CM | POA: Diagnosis not present

## 2020-12-10 MED ORDER — HYDROCHLOROTHIAZIDE 25 MG PO TABS: 25.0000 mg | ORAL_TABLET | Freq: Every day | ORAL | 3 refills | Status: DC

## 2020-12-10 NOTE — Patient Instructions (Signed)

## 2020-12-10 NOTE — Progress Notes (Signed)
  Subjective:    Ashley Guerrero - 55 y.o. female MRN [id number]  Date of birth: 05-18-1966  HPI  Ailyne Lauritsen is here for HTN f/u. Patient reports that her cardiologist gave her strict parameters for BP goals. However, she only remembers that DBP should be <70. She reports her BP has not been near this.    Chronic HTN Disease Monitoring:  Home BP Monitoring - 120s/upper 70s  Chest pain- no  Dyspnea- no Headache - no  Medications: Amlodipine 10 mg, Coreg 25 mg BID, Lisinopril 40 mg, HCTZ 12.5 mg (started at cardiology visit in May), Lasix 20 mg prn (takes 2-3 times per week)  Compliance- yes Lightheadedness- no  Edema- no    Health Maintenance:  Health Maintenance Due  Topic Date Due  . Pneumococcal Vaccine 55-23 Years old (1 of 2 - PPSV23) Never done  . COLON CANCER SCREENING ANNUAL FOBT  Never done  . Zoster Vaccines- Shingrix (1 of 2) Never done    -  reports that she has been smoking cigarettes. She started smoking about 6 years ago. She has been smoking about 1.00 pack per day. She has never used smokeless tobacco. - Review of Systems: Per HPI. - Past Medical History: Patient Active Problem List   Diagnosis Date Noted  . S/P ascending aortic replacement 11/04/2014  . Essential hypertension, malignant 11/04/2014  . Large  Abdominal mass- question uterine origin  11/04/2014   - Medications: reviewed and updated   Objective:   Physical Exam BP (!) 145/85 (BP Location: Right Arm, Patient Position: Sitting, Cuff Size: Large)   Pulse (!) 59   Resp 16   Wt 228 lb (103.4 kg)   LMP 10/29/2014   SpO2 95%   BMI 36.80 kg/m  Physical Exam Constitutional:      General: She is not in acute distress.    Appearance: She is not diaphoretic.  HENT:     Head: Normocephalic and atraumatic.  Eyes:     Conjunctiva/sclera: Conjunctivae normal.  Cardiovascular:     Rate and Rhythm: Normal rate and regular rhythm.     Heart sounds: Normal heart sounds. No  murmur heard.   Pulmonary:     Effort: Pulmonary effort is normal. No respiratory distress.     Breath sounds: Normal breath sounds.  Musculoskeletal:        General: Normal range of motion.  Skin:    General: Skin is warm and dry.  Neurological:     Mental Status: She is alert and oriented to person, place, and time.  Psychiatric:        Mood and Affect: Affect normal.        Judgment: Judgment normal.     Assessment & Plan:   1. Essential hypertension, malignant Given history of aortic dissection, striving for more tight P control. BP initially 154/86 at presentation, still above goal on repeat. Increase HCTZ to 25 mg. Instructed to follow up with cardiology and appears due for repeat labs with them in about 1-2 weeks. Reemphasized DASH diet.  - hydrochlorothiazide (HYDRODIURIL) 25 MG tablet; Take 1 tablet (25 mg total) by mouth daily.  Dispense: 90 tablet; Refill: 3  2. Dissection of aorta, unspecified portion of aorta ([place])  3. Tobacco use disorder Patient has quit smoking, >2 months ago. Congratulated her on this achievement.     Phill Myron, D.O. 12/10/2020, 3:01 PM Primary Care at [facility]

## 2020-12-10 NOTE — Progress Notes (Signed)
Back and leg pain, unable to move in the morning.  Takes tylenol arthritis around the clock Pain 7/10

## 2021-01-01 ENCOUNTER — Telehealth: Payer: Self-pay | Admitting: Cardiovascular Disease

## 2021-01-01 NOTE — Telephone Encounter (Signed)
Pt called stating she is worried because her diastolic is running greater than 70. Pt state she has a history of aorta dissection and was informed by Dr. Loletha Grayer that he would like her diastolic to always be less than 70. Pt state pcp increased HCTZ to 25 mg  two weeks ago but it still remains greater than 70.   117/83 6/29 118/77 6/28 117/74  6/28

## 2021-01-01 NOTE — Telephone Encounter (Signed)
I think that was a misunderstanding - I probably said that I would like her BP to be "in the 70s". Current blood pressure looks great

## 2021-01-01 NOTE — Telephone Encounter (Signed)
Pt c/o BP issue: STAT if pt c/o blurred vision, one-sided weakness or slurred speech  1. What are your last 5 BP readings? This morning  it was 117/83, last night it was 118/77 and 117/74- these are high readings for her- she said her primary doctor increased her Hydrochlorothiazide to 25 mg  2. Are you having any other symptoms (ex. Dizziness, headache, blurred vision, passed out)? no  3. What is your BP issue? Blood pressure is running high

## 2021-01-01 NOTE — Telephone Encounter (Signed)
The patient came in for lab work today. She has been made aware of Dr. Victorino December message. She will keep track of her blood pressure and if she has any concerns she has been advised to call back.

## 2021-01-02 LAB — BASIC METABOLIC PANEL
BUN/Creatinine Ratio: 17 (ref 9–23)
BUN: 15 mg/dL (ref 6–24)
CO2: 26 mmol/L (ref 20–29)
Calcium: 9.6 mg/dL (ref 8.7–10.2)
Chloride: 99 mmol/L (ref 96–106)
Creatinine, Ser: 0.86 mg/dL (ref 0.57–1.00)
Glucose: 98 mg/dL (ref 65–99)
Potassium: 3.9 mmol/L (ref 3.5–5.2)
Sodium: 141 mmol/L (ref 134–144)
eGFR: 80 mL/min/{1.73_m2} (ref >59)

## 2021-01-02 LAB — MAGNESIUM: Magnesium: 2.4 mg/dL — ABNORMAL HIGH (ref 1.6–2.3)

## 2021-01-29 ENCOUNTER — Emergency Department (HOSPITAL_COMMUNITY): Payer: 59

## 2021-01-29 ENCOUNTER — Observation Stay (HOSPITAL_COMMUNITY)
Admission: EM | Admit: 2021-01-29 | Discharge: 2021-01-30 | Disposition: A | Discharge status: Home / Self Care | Payer: 59 | Attending: Internal Medicine | Admitting: Internal Medicine

## 2021-01-29 ENCOUNTER — Other Ambulatory Visit: Payer: Self-pay

## 2021-01-29 ENCOUNTER — Observation Stay (HOSPITAL_COMMUNITY): Payer: 59

## 2021-01-29 DIAGNOSIS — Y900 Blood alcohol level of less than 20 mg/100 ml: Secondary | ICD-10-CM | POA: Diagnosis not present

## 2021-01-29 DIAGNOSIS — I5032 Chronic diastolic (congestive) heart failure: Secondary | ICD-10-CM | POA: Insufficient documentation

## 2021-01-29 DIAGNOSIS — Z79899 Other long term (current) drug therapy: Secondary | ICD-10-CM | POA: Insufficient documentation

## 2021-01-29 DIAGNOSIS — I110 Hypertensive heart disease with heart failure: Secondary | ICD-10-CM | POA: Diagnosis not present

## 2021-01-29 DIAGNOSIS — F17210 Nicotine dependence, cigarettes, uncomplicated: Secondary | ICD-10-CM | POA: Insufficient documentation

## 2021-01-29 DIAGNOSIS — G459 Transient cerebral ischemic attack, unspecified: Secondary | ICD-10-CM | POA: Diagnosis not present

## 2021-01-29 DIAGNOSIS — R410 Disorientation, unspecified: Secondary | ICD-10-CM

## 2021-01-29 DIAGNOSIS — Z8673 Personal history of transient ischemic attack (TIA), and cerebral infarction without residual deficits: Secondary | ICD-10-CM | POA: Insufficient documentation

## 2021-01-29 DIAGNOSIS — Z7982 Long term (current) use of aspirin: Secondary | ICD-10-CM | POA: Insufficient documentation

## 2021-01-29 DIAGNOSIS — R569 Unspecified convulsions: Secondary | ICD-10-CM | POA: Diagnosis not present

## 2021-01-29 DIAGNOSIS — Z20822 Contact with and (suspected) exposure to covid-19: Secondary | ICD-10-CM | POA: Diagnosis not present

## 2021-01-29 DIAGNOSIS — I10 Essential (primary) hypertension: Secondary | ICD-10-CM | POA: Diagnosis not present

## 2021-01-29 DIAGNOSIS — Z95828 Presence of other vascular implants and grafts: Secondary | ICD-10-CM

## 2021-01-29 DIAGNOSIS — R4701 Aphasia: Principal | ICD-10-CM | POA: Insufficient documentation

## 2021-01-29 LAB — COMPREHENSIVE METABOLIC PANEL
ALT: 18 U/L (ref 0–44)
AST: 21 U/L (ref 15–41)
Albumin: 4 g/dL (ref 3.5–5.0)
Alkaline Phosphatase: 89 U/L (ref 38–126)
Anion gap: 7 (ref 5–15)
BUN: 9 mg/dL (ref 6–20)
CO2: 27 mmol/L (ref 22–32)
Calcium: 9.4 mg/dL (ref 8.9–10.3)
Chloride: 101 mmol/L (ref 98–111)
Creatinine, Ser: 0.9 mg/dL (ref 0.44–1.00)
GFR, Estimated: >60 mL/min (ref >60)
Glucose, Bld: 110 mg/dL — ABNORMAL HIGH (ref 70–99)
Potassium: 3.3 mmol/L — ABNORMAL LOW (ref 3.5–5.1)
Sodium: 135 mmol/L (ref 135–145)
Total Bilirubin: 0.5 mg/dL (ref 0.3–1.2)
Total Protein: 7.5 g/dL (ref 6.5–8.1)

## 2021-01-29 LAB — I-STAT CHEM 8, ED
BUN: 12 mg/dL (ref 6–20)
Calcium, Ion: 1.16 mmol/L (ref 1.15–1.40)
Chloride: 101 mmol/L (ref 98–111)
Creatinine, Ser: 0.8 mg/dL (ref 0.44–1.00)
Glucose, Bld: 108 mg/dL — ABNORMAL HIGH (ref 70–99)
HCT: 44 % (ref 36.0–46.0)
Hemoglobin: 15 g/dL (ref 12.0–15.0)
Potassium: 3.3 mmol/L — ABNORMAL LOW (ref 3.5–5.1)
Sodium: 138 mmol/L (ref 135–145)
TCO2: 26 mmol/L (ref 22–32)

## 2021-01-29 LAB — CBC
HCT: 43.1 % (ref 36.0–46.0)
Hemoglobin: 14.2 g/dL (ref 12.0–15.0)
MCH: 31.3 pg (ref 26.0–34.0)
MCHC: 32.9 g/dL (ref 30.0–36.0)
MCV: 94.9 fL (ref 80.0–100.0)
Platelets: 289 10*3/uL (ref 150–400)
RBC: 4.54 MIL/uL (ref 3.87–5.11)
RDW: 14.3 % (ref 11.5–15.5)
WBC: 6 10*3/uL (ref 4.0–10.5)
nRBC: 0 % (ref 0.0–0.2)

## 2021-01-29 LAB — URINALYSIS, ROUTINE W REFLEX MICROSCOPIC
Bacteria, UA: NONE SEEN
Bilirubin Urine: NEGATIVE
Glucose, UA: NEGATIVE mg/dL
Ketones, ur: NEGATIVE mg/dL
Leukocytes,Ua: NEGATIVE
Nitrite: NEGATIVE
Protein, ur: NEGATIVE mg/dL
Specific Gravity, Urine: 1.021 (ref 1.005–1.030)
pH: 7 (ref 5.0–8.0)

## 2021-01-29 LAB — DIFFERENTIAL
Abs Immature Granulocytes: 0.01 10*3/uL (ref 0.00–0.07)
Basophils Absolute: 0 10*3/uL (ref 0.0–0.1)
Basophils Relative: 1 %
Eosinophils Absolute: 0.2 10*3/uL (ref 0.0–0.5)
Eosinophils Relative: 4 %
Immature Granulocytes: 0 %
Lymphocytes Relative: 40 %
Lymphs Abs: 2.4 10*3/uL (ref 0.7–4.0)
Monocytes Absolute: 0.5 10*3/uL (ref 0.1–1.0)
Monocytes Relative: 9 %
Neutro Abs: 2.8 10*3/uL (ref 1.7–7.7)
Neutrophils Relative %: 46 %

## 2021-01-29 LAB — RAPID URINE DRUG SCREEN, HOSP PERFORMED
Amphetamines: NOT DETECTED
Barbiturates: NOT DETECTED
Benzodiazepines: NOT DETECTED
Cocaine: NOT DETECTED
Opiates: NOT DETECTED
Tetrahydrocannabinol: POSITIVE — AB

## 2021-01-29 LAB — RESP PANEL BY RT-PCR (FLU A&B, COVID) ARPGX2
Influenza A by PCR: NEGATIVE
Influenza B by PCR: NEGATIVE
SARS Coronavirus 2 by RT PCR: NEGATIVE

## 2021-01-29 LAB — I-STAT BETA HCG BLOOD, ED (MC, WL, AP ONLY): I-stat hCG, quantitative: <5 m[IU]/mL (ref <5)

## 2021-01-29 LAB — PROTIME-INR
INR: 1 (ref 0.8–1.2)
Prothrombin Time: 12.9 seconds (ref 11.4–15.2)

## 2021-01-29 LAB — ETHANOL: Alcohol, Ethyl (B): <10 mg/dL (ref <10)

## 2021-01-29 LAB — APTT: aPTT: 30 seconds (ref 24–36)

## 2021-01-29 LAB — CBG MONITORING, ED: Glucose-Capillary: 127 mg/dL — ABNORMAL HIGH (ref 70–99)

## 2021-01-29 MED ORDER — IOHEXOL 350 MG/ML SOLN
100.0000 mL | Freq: Once | INTRAVENOUS | Status: AC | PRN
  Administered 2021-01-29: 100 mL via INTRAVENOUS

## 2021-01-29 MED ORDER — ONDANSETRON HCL 4 MG/2ML IJ SOLN
4.0000 mg | Freq: Four times a day (QID) | INTRAMUSCULAR | Status: DC | PRN
  Administered 2021-01-30: 4 mg via INTRAVENOUS
  Filled 2021-01-29: qty 2

## 2021-01-29 MED ORDER — SENNOSIDES-DOCUSATE SODIUM 8.6-50 MG PO TABS: 1.0000 | ORAL_TABLET | Freq: Every evening | ORAL | Status: DC | PRN

## 2021-01-29 MED ORDER — SIMVASTATIN 20 MG PO TABS
20.0000 mg | ORAL_TABLET | Freq: Every day | ORAL | Status: DC
  Administered 2021-01-29 – 2021-01-30 (×2): 20 mg via ORAL
  Filled 2021-01-29 (×2): qty 1

## 2021-01-29 MED ORDER — LORAZEPAM 2 MG/ML IJ SOLN
INTRAMUSCULAR | Status: AC
  Administered 2021-01-29: 1 mg
  Filled 2021-01-29: qty 1

## 2021-01-29 MED ORDER — STROKE: EARLY STAGES OF RECOVERY BOOK: Freq: Once | Status: DC

## 2021-01-29 MED ORDER — ENOXAPARIN SODIUM 40 MG/0.4ML IJ SOSY
40.0000 mg | PREFILLED_SYRINGE | INTRAMUSCULAR | Status: DC
  Administered 2021-01-29: 40 mg via SUBCUTANEOUS
  Filled 2021-01-29: qty 0.4

## 2021-01-29 MED ORDER — IOHEXOL 350 MG/ML SOLN
50.0000 mL | Freq: Once | INTRAVENOUS | Status: AC | PRN
  Administered 2021-01-29: 40 mL via INTRAVENOUS

## 2021-01-29 MED ORDER — LEVETIRACETAM IN NACL 500 MG/100ML IV SOLN
500.0000 mg | Freq: Two times a day (BID) | INTRAVENOUS | Status: DC
  Administered 2021-01-30: 500 mg via INTRAVENOUS
  Filled 2021-01-29 (×2): qty 100

## 2021-01-29 MED ORDER — LEVETIRACETAM IN NACL 1000 MG/100ML IV SOLN
1000.0000 mg | INTRAVENOUS | Status: AC
  Administered 2021-01-29 (×2): 1000 mg via INTRAVENOUS

## 2021-01-29 MED ORDER — ASPIRIN EC 81 MG PO TBEC: 81.0000 mg | DELAYED_RELEASE_TABLET | Freq: Every day | ORAL | Status: DC

## 2021-01-29 MED ORDER — LISINOPRIL 20 MG PO TABS
40.0000 mg | ORAL_TABLET | Freq: Every day | ORAL | Status: DC
  Administered 2021-01-30: 40 mg via ORAL
  Filled 2021-01-29: qty 2

## 2021-01-29 MED ORDER — CARVEDILOL 3.125 MG PO TABS
25.0000 mg | ORAL_TABLET | Freq: Two times a day (BID) | ORAL | Status: DC
  Administered 2021-01-30: 25 mg via ORAL
  Filled 2021-01-29: qty 8

## 2021-01-29 MED ORDER — ACETAMINOPHEN 500 MG PO TABS
500.0000 mg | ORAL_TABLET | Freq: Four times a day (QID) | ORAL | Status: DC | PRN
  Administered 2021-01-29: 500 mg via ORAL
  Filled 2021-01-29: qty 1

## 2021-01-29 MED ORDER — SODIUM CHLORIDE 0.9 % IV SOLN: 2000.0000 mg | INTRAVENOUS | Status: DC

## 2021-01-29 MED ORDER — ASPIRIN EC 81 MG PO TBEC
81.0000 mg | DELAYED_RELEASE_TABLET | Freq: Every day | ORAL | Status: DC
  Administered 2021-01-30: 81 mg via ORAL
  Filled 2021-01-29: qty 1

## 2021-01-29 MED ORDER — POTASSIUM CHLORIDE CRYS ER 20 MEQ PO TBCR
40.0000 meq | EXTENDED_RELEASE_TABLET | Freq: Once | ORAL | Status: AC
  Administered 2021-01-29: 40 meq via ORAL
  Filled 2021-01-29: qty 2

## 2021-01-29 MED ORDER — KETOROLAC TROMETHAMINE 30 MG/ML IJ SOLN
30.0000 mg | Freq: Once | INTRAMUSCULAR | Status: AC
  Administered 2021-01-30: 30 mg via INTRAVENOUS
  Filled 2021-01-29: qty 1

## 2021-01-29 NOTE — ED Notes (Addendum)
This RN went in to reassess pt pain. Pt exhibiting word salad. Cannot rate her pain. Can only tell me her name. Responses not taking sense. On call admit paged as well as neuro. Waunita Schooner, rapid reponse at bedside

## 2021-01-29 NOTE — ED Notes (Signed)
Pt back from MRI at this time. Pt remains AO. Dr. Malen Gauze at bedside. MRI reports that pt vomited in transport back to room.

## 2021-01-29 NOTE — Progress Notes (Signed)
She was working with coworkers at the [employer] when she suddenly had incoherent speech.  Upon my arrival patient is globally aphasic with mild right facial droop but no extremity weakness.  Per coworker her last known well was 1250.  Transported to ED for further evaluation.  Code Stroke called at 40

## 2021-01-29 NOTE — ED Notes (Signed)
Pt ambulatory to restroom

## 2021-01-29 NOTE — Progress Notes (Addendum)
HOSPITAL MEDICINE OVERNIGHT EVENT NOTE    Notified by nursing that patient began to exhibit recurrent confusion and what seems to be an expressive aphasia at approximately 9:35 PM.  Neurology and rapid response have already properly come to the patient's bedside and evaluated the patient.  Dr. Rory Percy with neurology recommended 1 mg of Ativan administration followed by initiation of Keppra.  Of note, EEG was performed earlier on 7/27 revealing a area epileptogenicity and cortical dysfunction arising the left frontotemporal region.    Dr.  Rory Percy additionally recommended repeat MRI brain and order has been placed.  Continue to monitor patient neurologically with serial neurologic checks.  Patient is currently sleeping.  Vernelle Emerald  MD [facility]   ADDENDUM (7/27 11:58pm)  Patient has returned from MRI.   Dr. Rory Percy has already briefly evaluated the patient and briefly reviewed the MRI images and states that the MRI does not reveal anything acute.  According to nursing, garbled speech seems to be improved.  Patient does not recall experiencing this earlier in the evening.  This leads me to believe that Ativan/Keppra may be effective.   Patient complaining of headache and experienced 1 episode of vomiting and MRI.  We will administer 30 mg of intravenous Toradol, as needed IV Zofran.  Sherryll Burger Earlisha Sharples

## 2021-01-29 NOTE — ED Notes (Addendum)
Dr. Marlyce Huge notified that pt is back from MRI and is having 9/10 frontal headache and is nauseated. Med orders to be put in

## 2021-01-29 NOTE — Code Documentation (Signed)
Stroke Response Nurse Documentation Code Documentation  Ashley Guerrero is a 55 y.o. female arriving to [place]. [facility] ED via  RRT transporting to ED  on 01/29/21 with past medical hx of hypertension, hyperlipidemia, history of aortic dissection status post aortic graft placement and resuspension of aortic valve. Reports recent change in blood pressure medication. Code stroke was activated by RRT. Patient from work in the [employer] where she was LKW at 1250 and now complaining of not feeling right, facial droop, aphasia.   Stroke team met patient and RRT in CT. Labs drawn prior to CT with RRT. NIHSS 7, see documentation for details and code stroke times. Patient with disoriented, not following commands, right facial droop, and Global aphasia  on exam.   The following imaging was completed: CT, CTA head and neck, CTP, MRI. Patient is not a candidate for tPA due to symptoms resolving. Care/Plan Q30 assessments until out of tpa window or Dr. Cheral Marker states otherwise based on MRI results. Bedside handoff with ED RN Joellen Jersey.    Leverne Humbles Stroke Response RN

## 2021-01-29 NOTE — Progress Notes (Signed)
EEG complete - results pending 

## 2021-01-29 NOTE — Consult Note (Addendum)
NEURO HOSPITALIST CONSULT NOTE   Reason for Consult: Altered Mental status  Requesting Physician: Dr. Darl Householder   HPI:                                                                                                                                          Ashley Guerrero is a 55 y.o. female with hypertension, hyperlipidemia, diastolic heart failure and remote history of type 1 aortic dissection (2016), s/p aortic graft placement and resuspension of the aortic valve.   History obtained from patient and her colleague who was present at the time of the event.   She presented to the ED as a Code Stroke this afternoon after she developed sudden onset altered mentation while at work. 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 After checking a gentleman in, her colleagues noticed that she was staring straight ahead and talking non-sensically. They note that her articulation was clear. They attempted to talk to her; however, she did not respond to them and she did not seem to know they were present. She was not shaking or having rapid blinking at that time. They called rapid response and she was sent to the ED as a code stroke.   CT head was obtained and negative for acute infarct or hemorrhage. On initial evaluation prior to CTA, she was disoriented, had confused responses to questions and in some cases her speech was with a similar quality to, but atypical for, word salad. CTA was negative for LVO. CTP showed no perfusion deficit. She then went directly to MRI. Upon arriving at MRI, she became more oriented and speech was improving. By the time of my secondary evaluation, after MRI, symptoms had completely resolved and she was able to provide further history.  She is unable to recollect what occurred, starting from the time that she had checked in the gentleman until she was in the CT room. She notes feeling "foggy" and "difficulty finding the right word" after that.  After MRI, she feels like she is back to her baseline.   She notes a family history of strokes in her maternal grandmother and several maternal aunts. She denies a personal history of strokes. She denies a personal or family history of seizures. Denies recent head trauma. Denies increased stress at work.   She denies any illicit drug use. She reports smoking around 3 cigarretes per week. She drinks a glass or two of wine each month.   She was recently changed from lasix to hydrochlorothiazide a couple of weeks ago. Denies any issues with hypotension or dizziness.    Past Medical History:  Diagnosis Date   Aortic dissection (HCC)    Status post repair 5/16   Chronic diastolic CHF (congestive heart failure) ([place])    Echo 6/16:  Severe LVH, EF 60-65%, no RWMA, Gr 1 DD, LA upper limits of normal, mild RAE,  aneurysmal intra-atrial septum   Fibroid uterus    Hypertension     Past Surgical History:  Procedure Laterality Date   REPLACEMENT ASCENDING AORTA N/A 11/03/2014   Procedure: REPLACEMENT ASCENDING AORTA with a 41m hemashield graft,  resuspension of aortic valve, hypothermic circulatory arrest and cardiopulmonary bypass.;  Surgeon: EGrace Isaac MD;  [facility]  Service: Open Heart Surgery;  Laterality: N/A;    Family History  Problem Relation Age of Onset   Hypertension Mother    Breast cancer Mother    Brain cancer Mother    Stroke Father    Heart failure Father    Hypertension Father    Diabetes Father    Hyperlipidemia Father    Hypertension Sister    Hypertension Brother    Cancer Sister    Breast cancer Maternal Aunt    Breast cancer Paternal Aunt    Breast cancer Maternal Grandmother    Breast cancer Paternal Grandmother    Heart attack Neg Hx        Social History: [place] Works as a rResearch scientist (physical sciences)at the [employer]  ~30 pack year smoking history. Currently smokes ~3 cigarettes  per week. Smoked around a pack a day for 30 years until 2016.  Consumes ~2 glasses of wine per month. Denies illicit drug use.   Allergies  Allergen Reactions   Influenza Vaccines     Can tolerate egg free vaccine     MEDICATIONS:                                                                                                                     No current facility-administered medications on file prior to encounter.   Current Outpatient Medications on File Prior to Encounter  Medication Sig Dispense Refill   acetaminophen (TYLENOL) 500 MG tablet Take 500 mg by mouth every 6 (six) hours as needed for mild pain.     amLODipine (NORVASC) 10 MG tablet Take 1 tablet (10 mg total) by mouth daily. 90 tablet 1   aspirin EC 81 MG tablet Take 1 tablet (81 mg total) by mouth daily. 90 tablet 3   carvedilol (COREG) 25 MG tablet Take 1 tablet (25 mg total) by mouth 2 (two) times daily with a meal. 180 tablet 1   furosemide (LASIX) 20 MG tablet Take 1 tablet (20 mg total) by mouth as needed for edema. 90 tablet 1   hydrochlorothiazide (HYDRODIURIL) 25 MG tablet Take 1 tablet (25 mg total) by mouth daily. 90 tablet 3   lisinopril (ZESTRIL) 40 MG tablet Take 1 tablet (40 mg total) by mouth daily. 90 tablet 1   potassium chloride (KLOR-CON) 10 MEQ tablet Take 1 tablet (10 mEq total) by mouth daily. 90 tablet 1   simvastatin (ZOCOR) 20 MG tablet Take 1 tablet (20 mg total) by mouth daily. 90 tablet 1      ROS:  Unable to be obtained due to acute cognitive/communication deficit.    Blood pressure (!) 150/63, pulse (!) 55, temperature 98.1 F (36.7 C), temperature source Oral, resp. rate 18, height '5\' 6"'$  (1.676 m), weight 104 kg, last menstrual period 10/29/2014, SpO2 100 %.   General Examination:                                                                                                        Physical Exam  General: obese. In no acute distress Cardiovascular- RRR Lungs- breathing comfortably on room air, no rales or rhonchi Abdomen- non-distended, soft Extremities- Warm, dry and intact Musculoskeletal-no joint tenderness, deformity or swelling Skin-warm and dry  Neurological Examination Mental Status: Oriented to person only on initial exam. Oriented x 4 on repeat exam. Expressive dysphasia present on initial exam but followed commands appropriately. Speech pattern and articulation normal on repeat exam Cranial Nerves: II: PERRL III,IV, VI: EOMs intact. Inconsistent peripheral visual field defect on the right on initial exam. Normal visual fields on repeat exam.  V,VII: smile symmetric, facial light touch sensation normal bilaterally VIII: hearing normal bilaterally IX,X: uvula rises symmetrically XI: bilateral shoulder shrug XII: tongue midline Motor: Right : Upper extremity   5/5    Left:     Upper extremity   5/5  Lower extremity   5/5     Lower extremity   5/5 Tone and bulk:normal tone throughout; no atrophy noted Sensory: Pinprick and light touch intact throughout, bilaterally Deep Tendon Reflexes: 1+ and symmetric throughout Plantars: Down going bilaterally.  Cerebellar: normal finger-to-nose Gait: steady, normal speed   Lab Results: Basic Metabolic Panel: Recent Labs  Lab 01/29/21 1312 01/29/21 1315  NA 135 138  K 3.3* 3.3*  CL 101 101  CO2 27  --   GLUCOSE 110* 108*  BUN 9 12  CREATININE 0.90 0.80  CALCIUM 9.4  --     CBC: Recent Labs  Lab 01/29/21 1312 01/29/21 1315  WBC 6.0  --   NEUTROABS 2.8  --   HGB 14.2 15.0  HCT 43.1 44.0  MCV 94.9  --   PLT 289  --     Cardiac Enzymes: No results for input(s): CKTOTAL, CKMB, CKMBINDEX, TROPONINI in the last 168 hours.  Lipid Panel: No results for input(s): CHOL, TRIG, HDL, CHOLHDL, VLDL, LDLCALC in the last 168 hours.  Imaging: MR BRAIN WO  CONTRAST  Result Date: 01/29/2021 CLINICAL DATA:  Neuro deficit, acute stroke suspected. EXAM: MRI HEAD WITHOUT CONTRAST TECHNIQUE: Multiplanar, multiecho pulse sequences of the brain and surrounding structures were obtained without intravenous contrast. COMPARISON:  Same day CT exams. FINDINGS: Brain: No acute infarction, hemorrhage, hydrocephalus, extra-axial collection or mass lesion. Mild scattered T2/FLAIR hyperintensities within the white matter, which are nonspecific but most likely related to chronic microvascular ischemic disease. Partially empty and expanded sella. Vascular: Major arterial flow voids are maintained at the skull base. See CTA for further evaluation. Skull and upper cervical spine: Normal marrow signal. Sinuses/Orbits: Clear sinuses.  Unremarkable orbits. Other: Small left and trace right mastoid effusions. IMPRESSION:  1. No acute intracranial abnormality. Specifically, no acute infarct. 2. 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 3. Partially empty and expanded sella, which is often a normal anatomic variant but can be associated with idiopathic intracranial hypertension (pseudotumor cerebri). Electronically Signed   By: Margaretha Sheffield MD   On: 01/29/2021 14:26   CT CEREBRAL PERFUSION W CONTRAST  Result Date: 01/29/2021 CLINICAL DATA:  Right facial droop. Aphasia. Code stroke. Neuro deficit, acute, stroke suspected. EXAM: CT ANGIOGRAPHY HEAD AND NECK CT PERFUSION BRAIN TECHNIQUE: Multidetector CT imaging of the head and neck was performed using the standard protocol during bolus administration of intravenous contrast. Multiplanar CT image reconstructions and MIPs were obtained to evaluate the vascular anatomy. Carotid stenosis measurements (when applicable) are obtained utilizing NASCET criteria, using the distal internal carotid diameter as the denominator. Multiphase CT imaging of the brain was performed following IV bolus contrast  injection. Subsequent parametric perfusion maps were calculated using RAPID software. CONTRAST:  12m OMNIPAQUE IOHEXOL 350 MG/ML SOLN; 471mOMNIPAQUE IOHEXOL 350 MG/ML SOLN COMPARISON:  None. FINDINGS: CTA NECK FINDINGS Aortic arch: The left vertebral artery originates directly from the aortic arch. Mild atherosclerotic calcifications are present at the arch without significant stenosis of the great vessels. No aneurysm is present. Right carotid system: The right common carotid artery is within normal limits. Bifurcation is unremarkable. Minimal calcification is present at the bifurcation. Cervical right ICA is normal. Left carotid system: The left common carotid artery is mildly tortuous without significant stenosis. Bifurcation is unremarkable. Cervical left ICA normal. Vertebral arteries: The right vertebral artery is the dominant vessel. Both vertebral arteries originate from the scratched at the right vertebral artery originates from the subclavian artery. There is no significant stenosis of either vertebral artery. Skeleton: Degenerative endplate changes are most evident C5-6 and C6-7. No focal lytic or blastic lesions are present. Patient is status post median sternotomy. Other neck: Soft tissues the neck are otherwise unremarkable. Salivary glands are within normal limits. Thyroid is normal. No significant adenopathy is present. No focal mucosal or submucosal lesions are present. Upper chest: Minimal dependent atelectasis is present in the lungs. Thoracic inlet is normal. Review of the MIP images confirms the above findings CTA HEAD FINDINGS Anterior circulation: Atherosclerotic changes are present within the cavernous internal carotid arteries bilaterally without significant stenosis through the ICA termini. The A1 and M1 segments are normal. The anterior communicating artery is patent. MCA bifurcations are within normal limits. ACA and MCA branch vessels are normal. Posterior circulation: The right  vertebral artery is slightly dominant to the left. PICA origins are visualized and normal. Vertebrobasilar junction is normal. The basilar artery is normal. Both posterior cerebral arteries originate from the basilar tip. PCA branch vessels are within normal limits bilaterally. Venous sinuses: The dural sinuses are patent. The straight sinus deep cerebral veins are intact. Cortical veins are within normal limits. No significant vascular malformation is evident. Anatomic variants: None Review of the MIP images confirms the above findings CT Brain Perfusion Findings: ASPECTS: 10/10 CBF (<30%) Volume: 9m45merfusion (Tmax>6.0s) volume: 9mL24msmatch Volume: 9mL 52mRESSION: 1. Normal variant CTA Circle of Willis without significant proximal stenosis, aneurysm, or branch vessel occlusion. 2. Minimal atherosclerotic changes at the right carotid bifurcation and cavernous internal carotid arteries bilaterally without significant stenosis. 3. Minimal degenerative changes in the cervical spine. 4. CT perfusion demonstrates no evidence for acute infarct or significant ischemia. The above was relayed via text pager to Dr. Kataryna Mcquilkin Kerney Elbe/27/2022 at  13:48 . Electronically Signed   By: San Morelle M.D.   On: 01/29/2021 13:53   CT HEAD CODE STROKE WO CONTRAST  Result Date: 01/29/2021 CLINICAL DATA:  Code stroke. Aphasia. Right facial droop. Neuro deficit, acute, stroke suspected. EXAM: CT HEAD WITHOUT CONTRAST TECHNIQUE: Contiguous axial images were obtained from the base of the skull through the vertex without intravenous contrast. COMPARISON:  None. FINDINGS: Brain: Posterior right ICA scratched at posterior right cerebellar hypodensity noted. No other focal cortical hypoattenuation present. No significant white matter disease. The ventricles are of normal size. No significant extraaxial fluid collection is present. Brainstem and cerebellum are otherwise within normal limits. Vascular: No hyperdense vessel or  unexpected calcification. Skull: Calvarium is intact. No focal lytic or blastic lesions are present. No significant extracranial soft tissue lesion is present. Sinuses/Orbits: The paranasal sinuses and mastoid air cells are clear. The globes and orbits are within normal limits. ASPECTS [facility] Stroke Program Early CT Score) - Ganglionic level infarction (caudate, lentiform nuclei, internal capsule, insula, M1-M3 cortex): 7/7 - Supraganglionic infarction (M4-M6 cortex): 3/3 Total score (0-10 with 10 being normal): 10/10 IMPRESSION: 1. Posterior right cerebellar hypodensity consistent with age indeterminate nonhemorrhagic infarct. 2. ASPECTS is 10/10. The above was relayed via text pager to Dr. Cheral Marker on 01/29/2021 at 13:27 . Electronically Signed   By: San Morelle M.D.   On: 01/29/2021 13:27   CT ANGIO HEAD NECK W WO CM (CODE STROKE)  Result Date: 01/29/2021 CLINICAL DATA:  Right facial droop. Aphasia. Code stroke. Neuro deficit, acute, stroke suspected. EXAM: CT ANGIOGRAPHY HEAD AND NECK CT PERFUSION BRAIN TECHNIQUE: Multidetector CT imaging of the head and neck was performed using the standard protocol during bolus administration of intravenous contrast. Multiplanar CT image reconstructions and MIPs were obtained to evaluate the vascular anatomy. Carotid stenosis measurements (when applicable) are obtained utilizing NASCET criteria, using the distal internal carotid diameter as the denominator. Multiphase CT imaging of the brain was performed following IV bolus contrast injection. Subsequent parametric perfusion maps were calculated using RAPID software. CONTRAST:  168m OMNIPAQUE IOHEXOL 350 MG/ML SOLN; 435mOMNIPAQUE IOHEXOL 350 MG/ML SOLN COMPARISON:  None. FINDINGS: CTA NECK FINDINGS Aortic arch: The left vertebral artery originates directly from the aortic arch. Mild atherosclerotic calcifications are present at the arch without significant stenosis of the great vessels. No aneurysm is present.  Right carotid system: The right common carotid artery is within normal limits. Bifurcation is unremarkable. Minimal calcification is present at the bifurcation. Cervical right ICA is normal. Left carotid system: The left common carotid artery is mildly tortuous without significant stenosis. Bifurcation is unremarkable. Cervical left ICA normal. Vertebral arteries: The right vertebral artery is the dominant vessel. Both vertebral arteries originate from the scratched at the right vertebral artery originates from the subclavian artery. There is no significant stenosis of either vertebral artery. Skeleton: Degenerative endplate changes are most evident C5-6 and C6-7. No focal lytic or blastic lesions are present. Patient is status post median sternotomy. Other neck: Soft tissues the neck are otherwise unremarkable. Salivary glands are within normal limits. Thyroid is normal. No significant adenopathy is present. No focal mucosal or submucosal lesions are present. Upper chest: Minimal dependent atelectasis is present in the lungs. Thoracic inlet is normal. Review of the MIP images confirms the above findings CTA HEAD FINDINGS Anterior circulation: Atherosclerotic changes are present within the cavernous internal carotid arteries bilaterally without significant stenosis through the ICA termini. The A1 and M1 segments are normal. The anterior communicating artery is  patent. MCA bifurcations are within normal limits. ACA and MCA branch vessels are normal. Posterior circulation: The right vertebral artery is slightly dominant to the left. PICA origins are visualized and normal. Vertebrobasilar junction is normal. The basilar artery is normal. Both posterior cerebral arteries originate from the basilar tip. PCA branch vessels are within normal limits bilaterally. Venous sinuses: The dural sinuses are patent. The straight sinus deep cerebral veins are intact. Cortical veins are within normal limits. No significant vascular  malformation is evident. Anatomic variants: None Review of the MIP images confirms the above findings CT Brain Perfusion Findings: ASPECTS: 10/10 CBF (<30%) Volume: 469m Perfusion (Tmax>6.0s) volume: 068mMismatch Volume: 69m76mMPRESSION: 1. Normal variant CTA Circle of Willis without significant proximal stenosis, aneurysm, or branch vessel occlusion. 2. Minimal atherosclerotic changes at the right carotid bifurcation and cavernous internal carotid arteries bilaterally without significant stenosis. 3. Minimal degenerative changes in the cervical spine. 4. CT perfusion demonstrates no evidence for acute infarct or significant ischemia. The above was relayed via text pager to Dr. ERIKerney Elbe 01/29/2021 at 13:48 . Electronically Signed   By: ChrSan MorelleD.   On: 01/29/2021 13:53     Assessment: [age]ar old female with hypertension, hyperlipidemia, and remote history of type 1 aortic dissection s/p repair (2016) who presented to the ED after sudden onset of altered mental status, at which time she was staring straight ahead, speaking non-sensically (but with no speech slurring), and not responding to onlookers.  She had word finding difficulty on initial exam. Code stroke was called. Imaging, including CTH, CTA head/neck with perfusion and MRI brain were negative for an acute infarct or bleed. - MRI brain reveals no acute intracranial abnormality. 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  - CTA of head: Normal variant CTA Circle of Willis without significant proximal stenosis, aneurysm, or branch vessel occlusion. - CTA of neck: Minimal atherosclerotic changes at the right carotid bifurcation and cavernous internal carotid arteries bilaterally without significant stenosis.  - CTP: No evidence for acute infarct or significant ischemia.   Recommendations:  Seizure with post-ictal state vs TIA.  -FaBurtis Junese former in the context of history and exam. No  known seizure history; however, suspect that may have been post-ictal on initial exam.  -Overall quality of her speech deficit would be atypical for TIA or stroke -She does have risk factors for vascular disease including evidence of prior stroke on imaging, hypertension, hyperlipidemia, heart failure, and a 30 pack year smoking history.  -Imaging suggestive of chronic microvascular disease.  -Currently takes a baby aspirin daily -No significant lab derangements. Potassium slightly low at 3.3  -UDS + for THC. Alcohol <10.  Recommendations: - Admit to the hospitalist service for ongoing monitoring regarding possible partial complex seizure activity - EEG ordered - Seizure precautions - Check lipid panel and A1C  Addendum: -EEG: ABNORMALITY: -Spike,left frontotemporal region. - Intermittent slow, generalized, left frontotemporal region.  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 No seizures were seen throughout the recording. -Loaded with Keppra and started on 500 mg BID   RylMitzi HansenD Internal Medicine Resident PGY-3 [facility] Pager: #[phone]/2022 3:38 PM     I have seen and examined the patient. I have formulated the assessment and recommendations. [age]ar old female presenting with acute onset of confusion and speech deficit. Exam reveals no motor or sensory deficits. Overall presentation is atypical  for stroke. Favor new onset complex partial seizure as the etiology in the context of history and exam. No known seizure history; however, suspect that may have been post-ictal on initial exam. EEG has been ordered.  Electronically signed: Dr. Kerney Elbe

## 2021-01-29 NOTE — ED Notes (Signed)
Pt more alert at this time - pt to go to MRI - Pt able to tell me her name, location, age, and year

## 2021-01-29 NOTE — ED Provider Notes (Signed)
Feeling fine, difficulty finding words  No droop noted on exam   Care assumed from [facility], PA-C at shift change.   In brief, this patient is a 55 year old female past ministry of aortic dissection, hypertension, heart failure who presents for evaluation as a code stroke.  At approximately 12:15 PM, patient started having word salad.  Colleagues noticed that she had aphasia and appeared to have some right-sided facial droop.  Patient had had some previous improvement but does not remember what had happened and states that she just remembers feeling foggy.  Please see note from previous provider for full history/physical exam.   Physical Exam  BP (!) 147/77   Pulse 62   Temp 98.1 F (36.7 C) (Oral)   Resp (!) 27   Ht '5\' 6"'$  (1.676 m)   Wt 104 kg   LMP 10/29/2014   SpO2 99%   BMI 37.01 kg/m   Physical Exam  ED Course/Procedures     Procedures  Results for orders placed or performed during the hospital encounter of 01/29/21 (from the past 24 hour(s))  CBG monitoring, ED     Status: Abnormal   Collection Time: 01/29/21  1:03 PM  Result Value Ref Range   Glucose-Capillary 127 (H) 70 - 99 mg/dL  Resp Panel by RT-PCR (Flu A&B, Covid) Nasopharyngeal Swab     Status: None   Collection Time: 01/29/21  1:12 PM   Specimen: Nasopharyngeal Swab; Nasopharyngeal(NP) swabs in vial transport medium  Result Value Ref Range   SARS Coronavirus 2 by RT PCR NEGATIVE NEGATIVE   Influenza A by PCR NEGATIVE NEGATIVE   Influenza B by PCR NEGATIVE NEGATIVE  Ethanol     Status: None   Collection Time: 01/29/21  1:12 PM  Result Value Ref Range   Alcohol, Ethyl (B) <10 <10 mg/dL  Protime-INR     Status: None   Collection Time: 01/29/21  1:12 PM  Result Value Ref Range   Prothrombin Time 12.9 11.4 - 15.2 seconds   INR 1.0 0.8 - 1.2  APTT     Status: None   Collection Time: 01/29/21  1:12 PM  Result Value Ref Range   aPTT 30 24 - 36 seconds  CBC     Status: None   Collection Time: 01/29/21   1:12 PM  Result Value Ref Range   WBC 6.0 4.0 - 10.5 K/uL   RBC 4.54 3.87 - 5.11 MIL/uL   Hemoglobin 14.2 12.0 - 15.0 g/dL   HCT 43.1 36.0 - 46.0 %   MCV 94.9 80.0 - 100.0 fL   MCH 31.3 26.0 - 34.0 pg   MCHC 32.9 30.0 - 36.0 g/dL   RDW 14.3 11.5 - 15.5 %   Platelets 289 150 - 400 K/uL   nRBC 0.0 0.0 - 0.2 %  Differential     Status: None   Collection Time: 01/29/21  1:12 PM  Result Value Ref Range   Neutrophils Relative % 46 %   Neutro Abs 2.8 1.7 - 7.7 K/uL   Lymphocytes Relative 40 %   Lymphs Abs 2.4 0.7 - 4.0 K/uL   Monocytes Relative 9 %   Monocytes Absolute 0.5 0.1 - 1.0 K/uL   Eosinophils Relative 4 %   Eosinophils Absolute 0.2 0.0 - 0.5 K/uL   Basophils Relative 1 %   Basophils Absolute 0.0 0.0 - 0.1 K/uL   Immature Granulocytes 0 %   Abs Immature Granulocytes 0.01 0.00 - 0.07 K/uL  Comprehensive metabolic panel  Status: Abnormal   Collection Time: 01/29/21  1:12 PM  Result Value Ref Range   Sodium 135 135 - 145 mmol/L   Potassium 3.3 (L) 3.5 - 5.1 mmol/L   Chloride 101 98 - 111 mmol/L   CO2 27 22 - 32 mmol/L   Glucose, Bld 110 (H) 70 - 99 mg/dL   BUN 9 6 - 20 mg/dL   Creatinine, Ser 0.90 0.44 - 1.00 mg/dL   Calcium 9.4 8.9 - 10.3 mg/dL   Total Protein 7.5 6.5 - 8.1 g/dL   Albumin 4.0 3.5 - 5.0 g/dL   AST 21 15 - 41 U/L   ALT 18 0 - 44 U/L   Alkaline Phosphatase 89 38 - 126 U/L   Total Bilirubin 0.5 0.3 - 1.2 mg/dL   GFR, Estimated >60 >60 mL/min   Anion gap 7 5 - 15  Urine rapid drug screen (hosp performed)     Status: Abnormal   Collection Time: 01/29/21  1:12 PM  Result Value Ref Range   Opiates NONE DETECTED NONE DETECTED   Cocaine NONE DETECTED NONE DETECTED   Benzodiazepines NONE DETECTED NONE DETECTED   Amphetamines NONE DETECTED NONE DETECTED   Tetrahydrocannabinol POSITIVE (A) NONE DETECTED   Barbiturates NONE DETECTED NONE DETECTED  Urinalysis, Routine w reflex microscopic     Status: Abnormal   Collection Time: 01/29/21  1:12 PM  Result  Value Ref Range   Color, Urine STRAW (A) YELLOW   APPearance CLEAR CLEAR   Specific Gravity, Urine 1.021 1.005 - 1.030   pH 7.0 5.0 - 8.0   Glucose, UA NEGATIVE NEGATIVE mg/dL   Hgb urine dipstick MODERATE (A) NEGATIVE   Bilirubin Urine NEGATIVE NEGATIVE   Ketones, ur NEGATIVE NEGATIVE mg/dL   Protein, ur NEGATIVE NEGATIVE mg/dL   Nitrite NEGATIVE NEGATIVE   Leukocytes,Ua NEGATIVE NEGATIVE   RBC / HPF 6-10 0 - 5 RBC/hpf   WBC, UA 0-5 0 - 5 WBC/hpf   Bacteria, UA NONE SEEN NONE SEEN   Squamous Epithelial / LPF 0-5 0 - 5  I-stat chem 8, ED     Status: Abnormal   Collection Time: 01/29/21  1:15 PM  Result Value Ref Range   Sodium 138 135 - 145 mmol/L   Potassium 3.3 (L) 3.5 - 5.1 mmol/L   Chloride 101 98 - 111 mmol/L   BUN 12 6 - 20 mg/dL   Creatinine, Ser 0.80 0.44 - 1.00 mg/dL   Glucose, Bld 108 (H) 70 - 99 mg/dL   Calcium, Ion 1.16 1.15 - 1.40 mmol/L   TCO2 26 22 - 32 mmol/L   Hemoglobin 15.0 12.0 - 15.0 g/dL   HCT 44.0 36.0 - 46.0 %  I-Stat beta hCG blood, ED     Status: None   Collection Time: 01/29/21  1:19 PM  Result Value Ref Range   I-stat hCG, quantitative <5.0 <5 mIU/mL   Comment 3            MDM    PLAN: Plan to admit for TIA workout.   MDM: I have been negative.  UA shows moderate hemoglobin.  No infectious etiology.  UDS is positive for marijuana.  COVID-negative.  Ethanol negative.  CMP unremarkable.  CBC shows no leukocytosis or anemia.  CTA negative.  MRI shows remote infarct.  No acute.  The  Per neuro, patient needs admission for TIA work up.   Discussed patient with Dr. Karleen Hampshire (hospitalist) who accepts patient for asmission.     1. Aphasia  Portions of this note were generated with [employer]. Dictation errors may occur despite best attempts at proofreading.    Volanda Napoleon, PA-C 01/29/21 [id number]    Drenda Freeze, MD 01/30/21 [phone]

## 2021-01-29 NOTE — H&P (Signed)
History and Physical    Ashley Guerrero [id number] DOB: [birth year] DOA: 01/29/2021  PCP: Nicolette Bang, MD  Patient coming from: Home.   I have personally briefly reviewed patient's old medical records in [place]  Chief Complaint: altered mental status, right facial droop.   HPI: Ashley Guerrero is a 55 y.o. female with medical history significant of hypertension, hyperlipidemia, chronic diastolic heart failure, h/o aortic dissection s/p repair was working in the [employer]	[place] at [facility], when her colleagues noticed that she was staring and talking without making sense. She was brought in to ED as code stroke, and was evaluated by neurology, recommended medical admission for evaluation of TIA and seizures.  She denies any headache, nausea, vomiting or abdominal pain. She denies chest pain or sob, cough, fever or chills . She denies remembering what has happened, but she remembers coming down to the ED.  She denies dysuria, hematemesis, dizziness, syncope or orthopnea or PND.    ED Course: lab work unremarkable except for potassium of 3.3, cbg of 108. Respiratory panel is negative. UA is negative for infection. UDS is positive for tetra hydro cannabinol.  CT head without contrast showed Posterior right cerebellar hypodensity consistent with age indeterminate nonhemorrhagic infarct. 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 Partially empty and expanded sella, which is often a normal anatomic variant but can be associated with idiopathic intracranial hypertension (pseudotumor cerebri). No acute stroke.    CTA of the head and neck is unremarkable for significant stenosis.   EEG ordered and pending.   She is referred to medical service for admission for evaluation of TIA  and seizures.   Review of Systems: As per HPI otherwise " "All others reviewed and are negative,"  Past Medical History:   Diagnosis Date   Aortic dissection ([place])    Status post repair 5/16   Chronic diastolic CHF (congestive heart failure) ([place])    Echo 6/16:  Severe LVH, EF 60-65%, no RWMA, Gr 1 DD, LA upper limits of normal, mild RAE, aneurysmal intra-atrial septum   Fibroid uterus    Hypertension     Past Surgical History:  Procedure Laterality Date   REPLACEMENT ASCENDING AORTA N/A 11/03/2014   Procedure: REPLACEMENT ASCENDING AORTA with a 16m hemashield graft,  resuspension of aortic valve, hypothermic circulatory arrest and cardiopulmonary bypass.;  Surgeon: EGrace Isaac MD;  [facility]  Service: Open Heart Surgery;  Laterality: N/A;    Social History  reports that she has been smoking cigarettes. She started smoking about 6 years ago. She has been smoking an average of 1 pack per day. She has never used smokeless tobacco. She reports previous alcohol use. She reports previous drug use. Drug: Marijuana.  Allergies  Allergen Reactions   Influenza Vaccines     Can tolerate egg free vaccine     Family History  Problem Relation Age of Onset   Hypertension Mother    Breast cancer Mother    Brain cancer Mother    Stroke Father    Heart failure Father    Hypertension Father    Diabetes Father    Hyperlipidemia Father    Hypertension Sister    Hypertension Brother    Cancer Sister    Breast cancer Maternal Aunt    Breast cancer Paternal Aunt    Breast cancer Maternal Grandmother    Breast cancer Paternal Grandmother    Heart attack Neg Hx    Family  history reviewed and pertinent.   Prior to Admission medications   Medication Sig Start Date End Date Taking? Authorizing Provider  acetaminophen (TYLENOL) 500 MG tablet Take 500 mg by mouth every 6 (six) hours as needed for mild pain.   Yes [provider]  amLODipine (NORVASC) 10 MG tablet Take 1 tablet (10 mg total) by mouth daily. 08/29/20  Yes Nicolette Bang, MD  aspirin EC 81 MG tablet Take 1 tablet (81 mg  total) by mouth daily. 06/13/19  Yes Croitoru, Mihai, MD  carvedilol (COREG) 25 MG tablet Take 1 tablet (25 mg total) by mouth 2 (two) times daily with a meal. 08/29/20  Yes Nicolette Bang, MD  furosemide (LASIX) 20 MG tablet Take 1 tablet (20 mg total) by mouth as needed for edema. 11/08/20  Yes Croitoru, Mihai, MD  hydrochlorothiazide (HYDRODIURIL) 25 MG tablet Take 1 tablet (25 mg total) by mouth daily. 12/10/20  Yes Nicolette Bang, MD  lisinopril (ZESTRIL) 40 MG tablet Take 1 tablet (40 mg total) by mouth daily. 08/29/20  Yes Nicolette Bang, MD  potassium chloride (KLOR-CON) 10 MEQ tablet Take 1 tablet (10 mEq total) by mouth daily. 08/29/20  Yes Nicolette Bang, MD  simvastatin (ZOCOR) 20 MG tablet Take 1 tablet (20 mg total) by mouth daily. 08/29/20  Yes Nicolette Bang, MD    Physical Exam: Vitals:   01/29/21 1450 01/29/21 1500 01/29/21 1605 01/29/21 1700  BP: (!) 142/70 (!) 150/63 138/65 139/67  Pulse: (!) 54 (!) 55 (!) 54 (!) 52  Resp: '20 18 20 19  '$ Temp:      TempSrc:      SpO2: 100% 100% 99% 99%  Weight:      Height:        Constitutional: NAD, calm, comfortable Vitals:   01/29/21 1450 01/29/21 1500 01/29/21 1605 01/29/21 1700  BP: (!) 142/70 (!) 150/63 138/65 139/67  Pulse: (!) 54 (!) 55 (!) 54 (!) 52  Resp: '20 18 20 19  '$ Temp:      TempSrc:      SpO2: 100% 100% 99% 99%  Weight:      Height:       Eyes: PERRL, lids and conjunctivae normal ENMT: Mucous membranes are moist. Posterior pharynx clear of any exudate or lesions.Normal dentition.  Neck: normal, supple, no masses, no thyromegaly Respiratory: clear to auscultation bilaterally, no wheezing, no crackles. Normal respiratory effort. No accessory muscle use.  Cardiovascular: Regular rate and rhythm, no murmurs / rubs / gallops. No extremity edema. 2+ pedal pulses. No carotid bruits.  Abdomen: no tenderness, no masses palpated. No hepatosplenomegaly. Bowel sounds positive.   Musculoskeletal: no clubbing / cyanosis. No joint deformity upper and lower extremities. Good ROM, no contractures. Normal muscle tone.  Skin: no rashes, lesions, ulcers. No induration Neurologic: CN 2-12 grossly intact. Sensation intact, DTR normal. Strength 5/5 in all 4.  Psychiatric: Normal judgment and insight. Alert and oriented x 3. Normal mood.   Labs on Admission: I have personally reviewed following labs and imaging studies  CBC: Recent Labs  Lab 01/29/21 1312 01/29/21 1315  WBC 6.0  --   NEUTROABS 2.8  --   HGB 14.2 15.0  HCT 43.1 44.0  MCV 94.9  --   PLT 289  --     Basic Metabolic Panel: Recent Labs  Lab 01/29/21 1312 01/29/21 1315  NA 135 138  K 3.3* 3.3*  CL 101 101  CO2 27  --   GLUCOSE 110*  108*  BUN 9 12  CREATININE 0.90 0.80  CALCIUM 9.4  --     GFR: Estimated Creatinine Clearance: 96.8 mL/min (by C-G formula based on SCr of 0.8 mg/dL).  Liver Function Tests: Recent Labs  Lab 01/29/21 1312  AST 21  ALT 18  ALKPHOS 89  BILITOT 0.5  PROT 7.5  ALBUMIN 4.0    Urine analysis:    Component Value Date/Time   COLORURINE STRAW (A) 01/29/2021 1312   APPEARANCEUR CLEAR 01/29/2021 1312   LABSPEC 1.021 01/29/2021 1312   PHURINE 7.0 01/29/2021 1312   GLUCOSEU NEGATIVE 01/29/2021 1312   HGBUR MODERATE (A) 01/29/2021 1312   BILIRUBINUR NEGATIVE 01/29/2021 1312   BILIRUBINUR negative 09/20/2018 [place] 01/29/2021 1312   PROTEINUR NEGATIVE 01/29/2021 1312   UROBILINOGEN 0.2 09/20/2018 0922   UROBILINOGEN 0.2 11/03/2014 1534   NITRITE NEGATIVE 01/29/2021 1312   LEUKOCYTESUR NEGATIVE 01/29/2021 1312    Radiological Exams on Admission: MR BRAIN WO CONTRAST  Result Date: 01/29/2021 CLINICAL DATA:  Neuro deficit, acute stroke suspected. EXAM: MRI HEAD WITHOUT CONTRAST TECHNIQUE: Multiplanar, multiecho pulse sequences of the brain and surrounding structures were obtained without intravenous contrast. COMPARISON:  Same day CT exams.  FINDINGS: Brain: No acute infarction, hemorrhage, hydrocephalus, extra-axial collection or mass lesion. Mild scattered T2/FLAIR hyperintensities within the white matter, which are nonspecific but most likely related to chronic microvascular ischemic disease. Partially empty and expanded sella. Vascular: Major arterial flow voids are maintained at the skull base. See CTA for further evaluation. Skull and upper cervical spine: Normal marrow signal. Sinuses/Orbits: Clear sinuses.  Unremarkable orbits. Other: Small left and trace right mastoid effusions. IMPRESSION: 1. No acute intracranial abnormality. Specifically, no acute infarct. 2. 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 3. Partially empty and expanded sella, which is often a normal anatomic variant but can be associated with idiopathic intracranial hypertension (pseudotumor cerebri). Electronically Signed   By: Margaretha Sheffield MD   On: 01/29/2021 14:26   CT CEREBRAL PERFUSION W CONTRAST  Result Date: 01/29/2021 CLINICAL DATA:  Right facial droop. Aphasia. Code stroke. Neuro deficit, acute, stroke suspected. EXAM: CT ANGIOGRAPHY HEAD AND NECK CT PERFUSION BRAIN TECHNIQUE: Multidetector CT imaging of the head and neck was performed using the standard protocol during bolus administration of intravenous contrast. Multiplanar CT image reconstructions and MIPs were obtained to evaluate the vascular anatomy. Carotid stenosis measurements (when applicable) are obtained utilizing NASCET criteria, using the distal internal carotid diameter as the denominator. Multiphase CT imaging of the brain was performed following IV bolus contrast injection. Subsequent parametric perfusion maps were calculated using RAPID software. CONTRAST:  14m OMNIPAQUE IOHEXOL 350 MG/ML SOLN; 482mOMNIPAQUE IOHEXOL 350 MG/ML SOLN COMPARISON:  None. FINDINGS: CTA NECK FINDINGS Aortic arch: The left vertebral artery originates directly from the aortic  arch. Mild atherosclerotic calcifications are present at the arch without significant stenosis of the great vessels. No aneurysm is present. Right carotid system: The right common carotid artery is within normal limits. Bifurcation is unremarkable. Minimal calcification is present at the bifurcation. Cervical right ICA is normal. Left carotid system: The left common carotid artery is mildly tortuous without significant stenosis. Bifurcation is unremarkable. Cervical left ICA normal. Vertebral arteries: The right vertebral artery is the dominant vessel. Both vertebral arteries originate from the scratched at the right vertebral artery originates from the subclavian artery. There is no significant stenosis of either vertebral artery. Skeleton: Degenerative endplate changes are most evident C5-6 and C6-7. No focal  lytic or blastic lesions are present. Patient is status post median sternotomy. Other neck: Soft tissues the neck are otherwise unremarkable. Salivary glands are within normal limits. Thyroid is normal. No significant adenopathy is present. No focal mucosal or submucosal lesions are present. Upper chest: Minimal dependent atelectasis is present in the lungs. Thoracic inlet is normal. Review of the MIP images confirms the above findings CTA HEAD FINDINGS Anterior circulation: Atherosclerotic changes are present within the cavernous internal carotid arteries bilaterally without significant stenosis through the ICA termini. The A1 and M1 segments are normal. The anterior communicating artery is patent. MCA bifurcations are within normal limits. ACA and MCA branch vessels are normal. Posterior circulation: The right vertebral artery is slightly dominant to the left. PICA origins are visualized and normal. Vertebrobasilar junction is normal. The basilar artery is normal. Both posterior cerebral arteries originate from the basilar tip. PCA branch vessels are within normal limits bilaterally. Venous sinuses: The  dural sinuses are patent. The straight sinus deep cerebral veins are intact. Cortical veins are within normal limits. No significant vascular malformation is evident. Anatomic variants: None Review of the MIP images confirms the above findings CT Brain Perfusion Findings: ASPECTS: 10/10 CBF (<30%) Volume: 59m Perfusion (Tmax>6.0s) volume: 0180mMismatch Volume: 80m180mMPRESSION: 1. Normal variant CTA Circle of Willis without significant proximal stenosis, aneurysm, or branch vessel occlusion. 2. Minimal atherosclerotic changes at the right carotid bifurcation and cavernous internal carotid arteries bilaterally without significant stenosis. 3. Minimal degenerative changes in the cervical spine. 4. CT perfusion demonstrates no evidence for acute infarct or significant ischemia. The above was relayed via text pager to Dr. ERIKerney Elbe 01/29/2021 at 13:48 . Electronically Signed   By: ChrSan MorelleD.   On: 01/29/2021 13:53   CT HEAD CODE STROKE WO CONTRAST  Result Date: 01/29/2021 CLINICAL DATA:  Code stroke. Aphasia. Right facial droop. Neuro deficit, acute, stroke suspected. EXAM: CT HEAD WITHOUT CONTRAST TECHNIQUE: Contiguous axial images were obtained from the base of the skull through the vertex without intravenous contrast. COMPARISON:  None. FINDINGS: Brain: Posterior right ICA scratched at posterior right cerebellar hypodensity noted. No other focal cortical hypoattenuation present. No significant white matter disease. The ventricles are of normal size. No significant extraaxial fluid collection is present. Brainstem and cerebellum are otherwise within normal limits. Vascular: No hyperdense vessel or unexpected calcification. Skull: Calvarium is intact. No focal lytic or blastic lesions are present. No significant extracranial soft tissue lesion is present. Sinuses/Orbits: The paranasal sinuses and mastoid air cells are clear. The globes and orbits are within normal limits. ASPECTS ([facility] Early CT Score) - Ganglionic level infarction (caudate, lentiform nuclei, internal capsule, insula, M1-M3 cortex): 7/7 - Supraganglionic infarction (M4-M6 cortex): 3/3 Total score (0-10 with 10 being normal): 10/10 IMPRESSION: 1. Posterior right cerebellar hypodensity consistent with age indeterminate nonhemorrhagic infarct. 2. ASPECTS is 10/10. The above was relayed via text pager to Dr. LINCheral Marker 01/29/2021 at 13:27 . Electronically Signed   By: ChrSan MorelleD.   On: 01/29/2021 13:27   CT ANGIO HEAD NECK W WO CM (CODE STROKE)  Result Date: 01/29/2021 CLINICAL DATA:  Right facial droop. Aphasia. Code stroke. Neuro deficit, acute, stroke suspected. EXAM: CT ANGIOGRAPHY HEAD AND NECK CT PERFUSION BRAIN TECHNIQUE: Multidetector CT imaging of the head and neck was performed using the standard protocol during bolus administration of intravenous contrast. Multiplanar CT image reconstructions and MIPs were obtained to evaluate the vascular anatomy. Carotid stenosis measurements (when applicable) are  obtained utilizing NASCET criteria, using the distal internal carotid diameter as the denominator. Multiphase CT imaging of the brain was performed following IV bolus contrast injection. Subsequent parametric perfusion maps were calculated using RAPID software. CONTRAST:  164m OMNIPAQUE IOHEXOL 350 MG/ML SOLN; 443mOMNIPAQUE IOHEXOL 350 MG/ML SOLN COMPARISON:  None. FINDINGS: CTA NECK FINDINGS Aortic arch: The left vertebral artery originates directly from the aortic arch. Mild atherosclerotic calcifications are present at the arch without significant stenosis of the great vessels. No aneurysm is present. Right carotid system: The right common carotid artery is within normal limits. Bifurcation is unremarkable. Minimal calcification is present at the bifurcation. Cervical right ICA is normal. Left carotid system: The left common carotid artery is mildly tortuous without significant stenosis. Bifurcation  is unremarkable. Cervical left ICA normal. Vertebral arteries: The right vertebral artery is the dominant vessel. Both vertebral arteries originate from the scratched at the right vertebral artery originates from the subclavian artery. There is no significant stenosis of either vertebral artery. Skeleton: Degenerative endplate changes are most evident C5-6 and C6-7. No focal lytic or blastic lesions are present. Patient is status post median sternotomy. Other neck: Soft tissues the neck are otherwise unremarkable. Salivary glands are within normal limits. Thyroid is normal. No significant adenopathy is present. No focal mucosal or submucosal lesions are present. Upper chest: Minimal dependent atelectasis is present in the lungs. Thoracic inlet is normal. Review of the MIP images confirms the above findings CTA HEAD FINDINGS Anterior circulation: Atherosclerotic changes are present within the cavernous internal carotid arteries bilaterally without significant stenosis through the ICA termini. The A1 and M1 segments are normal. The anterior communicating artery is patent. MCA bifurcations are within normal limits. ACA and MCA branch vessels are normal. Posterior circulation: The right vertebral artery is slightly dominant to the left. PICA origins are visualized and normal. Vertebrobasilar junction is normal. The basilar artery is normal. Both posterior cerebral arteries originate from the basilar tip. PCA branch vessels are within normal limits bilaterally. Venous sinuses: The dural sinuses are patent. The straight sinus deep cerebral veins are intact. Cortical veins are within normal limits. No significant vascular malformation is evident. Anatomic variants: None Review of the MIP images confirms the above findings CT Brain Perfusion Findings: ASPECTS: 10/10 CBF (<30%) Volume: 5m28merfusion (Tmax>6.0s) volume: 5mL55msmatch Volume: 5mL 29mRESSION: 1. Normal variant CTA Circle of Willis without significant proximal  stenosis, aneurysm, or branch vessel occlusion. 2. Minimal atherosclerotic changes at the right carotid bifurcation and cavernous internal carotid arteries bilaterally without significant stenosis. 3. Minimal degenerative changes in the cervical spine. 4. CT perfusion demonstrates no evidence for acute infarct or significant ischemia. The above was relayed via text pager to Dr. ERIC Kerney Elbe/27/2022 at 13:48 . Electronically Signed   By: ChrisSan Morelle   On: 01/29/2021 13:53    EKG: Independently reviewed. SINUS RHYTHM with repolarization abn.   Assessment/Plan Active Problems:   S/P ascending aortic replacement   TIA (transient ischemic attack)   Essential hypertension     Altered mental status/acute encephalopathy of unclear etiology/ speech abnormality :  Resolved.  Diferential include seizures vs TIA.  TIA work up pending.  When she passes bedside swallow evaluation, she can be started on heart healthy diet  Echocardiogram is pending.  Resume aspirin.  EEG is pending.  Appreciate neurology input.   Hypokalemia:  Replaced.  Recheck labs in am.    Hypertension:  Well controlled.  Permissive hypertension.    Hyperlipidemia:  Lipid  panel is pending.  Resume statin.    Chronic diastolic heart failure;  She appears euvolemic, states she is not on lasix anymore.  Echocardiogram ordered.   H/o aortic dissection with repair In 5/ 2016.     DVT prophylaxis: LOVENOX.  Code Status:   FULL CODE.  Family Communication:  NONE AT BEDSIDE.  Disposition Plan:   Patient is from:  Home.    Anticipated DC to:  Home.   Anticipated DC date:  Tomorrow.   Anticipated DC barriers: Evaluation of TIA.   Consults called:  Neurology.  Admission status:  Obs/ tele  Severity of Illness: The appropriate patient status for this patient is OBSERVATION. Observation status is judged to be reasonable and necessary in order to provide the required intensity of service to ensure  the patient's safety. The patient's presenting symptoms, physical exam findings, and initial radiographic and laboratory data in the context of their medical condition is felt to place them at decreased risk for further clinical deterioration. Furthermore, it is anticipated that the patient will be medically stable for discharge from the hospital within 2 midnights of admission.   Hosie Poisson MD [facility]  How to contact the [facility] Attending or Consulting provider [place] or covering provider during after hours [place], for this patient?   Check the care team in [facility] and look for a) attending/consulting [facility] provider listed and b) the [facility] team listed Log into [email or web address] and use [place]'s universal password to access. If you do not have the password, please contact the hospital operator. Locate the [facility] provider you are looking for under [facility] and page to a number that you can be directly reached. If you still have difficulty reaching the provider, please page the [facility] (Director on Call) for the Hospitalists listed on amion for assistance.  01/29/2021, 6:27 PM

## 2021-01-29 NOTE — Procedures (Signed)
Patient Name: Ashley Guerrero  MRN: [id number]  Epilepsy Attending: Lora Havens  Referring Physician/Provider: Dr Mitzi Hansen Date: 01/29/2021 Duration: 25.20 mins  Patient history: 55yo F with ams. EEG to evaluate for seizure  Level of alertness: Awake  AEDs during EEG study: None  Technical aspects: This EEG study was done with scalp electrodes positioned according to the 10-20 International system of electrode placement. Electrical activity was acquired at a sampling rate of '500Hz'$  and reviewed with a high frequency filter of '70Hz'$  and a low frequency filter of '1Hz'$ . EEG data were recorded continuously and digitally stored.   Description: The posterior dominant rhythm consists of 9 Hz activity of moderate voltage (25-35 uV) seen predominantly in posterior head regions, symmetric and reactive to eye opening and eye closing. EEG showed intermittent 2-'3hz'$  delta slowing in left frontotemporal region. Spikes was also noted in left frontotemporal region. Hyperventilation and photic stimulation were not performed.     ABNORMALITY -Spike,left frontotemporal region.  - Intermittent slow, generalized, left frontotemporal region.   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 No seizures were seen throughout the recording.  [name] Barbra Sarks

## 2021-01-29 NOTE — ED Triage Notes (Signed)
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 Rapid response called and brought to ER for evaluation.

## 2021-01-29 NOTE — ED Notes (Signed)
Patient ambulatory to restroom with stand by assist.

## 2021-01-29 NOTE — ED Provider Notes (Signed)
[facility] EMERGENCY DEPARTMENT Provider Note   CSN: [id number] Arrival date & time: 01/29/21  1315     History No chief complaint on file.   Ashley Guerrero is a 55 y.o. female w/ a pmh of aortic dissection,, hypertension, diastolic heart failure, status post a sending aortic arch repair who presents the emergency department as a code stroke.  Patient was last seen normal at approximately 12:15 PM.  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  She had sudden onset of vacant stare, garbled, nonsensical speech.  Patient initially presented with word salad aphasia and disorientation with improvement in speech to word finding difficulty.  Patient now back to baseline.  Initial CT/CT angiogram negative for LVO and stat MRI ordered.  Patient is able to recall her symptoms states that she just felt foggy and had trouble finding words.  She has no previous history of TIA or stroke.  She has a history of seizures.  She smokes about 3 cigarettes/week.   HPI     Past Medical History:  Diagnosis Date   Aortic dissection (HCC)    Status post repair 5/16   Chronic diastolic CHF (congestive heart failure) ([place])    Echo 6/16:  Severe LVH, EF 60-65%, no RWMA, Gr 1 DD, LA upper limits of normal, mild RAE, aneurysmal intra-atrial septum   Fibroid uterus    Hypertension     Patient Active Problem List   Diagnosis Date Noted   TIA (transient ischemic attack) 01/29/2021   Essential hypertension 01/29/2021   S/P ascending aortic replacement 11/04/2014   Essential hypertension, malignant 11/04/2014   Large  Abdominal mass- question uterine origin  11/04/2014    Past Surgical History:  Procedure Laterality Date   REPLACEMENT ASCENDING AORTA N/A 11/03/2014   Procedure: REPLACEMENT ASCENDING AORTA with a 88m hemashield graft,  resuspension of aortic valve, hypothermic circulatory arrest and cardiopulmonary bypass.;  Surgeon: EGrace Isaac MD;  [place] [facility]   Service: Open Heart Surgery;  Laterality: N/A;     OB History     Gravida  1   Para      Term      Preterm      AB  1   Living         SAB  1   IAB      Ectopic      Multiple      Live Births  0           Family History  Problem Relation Age of Onset   Hypertension Mother    Breast cancer Mother    Brain cancer Mother    Stroke Father    Heart failure Father    Hypertension Father    Diabetes Father    Hyperlipidemia Father    Hypertension Sister    Hypertension Brother    Cancer Sister    Breast cancer Maternal Aunt    Breast cancer Paternal Aunt    Breast cancer Maternal Grandmother    Breast cancer Paternal Grandmother    Heart attack Neg Hx     Social History   Tobacco Use   Smoking status: Some Days    Packs/day: 1.00    Types: Cigarettes    Start date: 11/03/2014   Smokeless tobacco: Never  Vaping Use   Vaping Use: Never used  Substance Use Topics   Alcohol use: Not Currently    Alcohol/week: 0.0 standard drinks    Comment: occasional  Drug use: Not Currently    Types: Marijuana    Home Medications Prior to Admission medications   Medication Sig Start Date End Date Taking? Authorizing Provider  acetaminophen (TYLENOL) 500 MG tablet Take 500 mg by mouth every 6 (six) hours as needed for mild pain.   Yes [provider]  aspirin EC 81 MG tablet Take 1 tablet (81 mg total) by mouth daily. 06/13/19  Yes Croitoru, Mihai, MD  carvedilol (COREG) 25 MG tablet Take 1 tablet (25 mg total) by mouth 2 (two) times daily with a meal. 08/29/20  Yes Nicolette Bang, MD  furosemide (LASIX) 20 MG tablet Take 1 tablet (20 mg total) by mouth as needed for edema. 11/08/20  Yes Croitoru, Mihai, MD  hydrochlorothiazide (HYDRODIURIL) 25 MG tablet Take 1 tablet (25 mg total) by mouth daily. 12/10/20  Yes Nicolette Bang, MD  potassium chloride (KLOR-CON) 10 MEQ tablet Take 1 tablet (10 mEq total) by mouth daily. 08/29/20  Yes Nicolette Bang, MD  simvastatin (ZOCOR) 20 MG tablet Take 1 tablet (20 mg total) by mouth daily. 08/29/20  Yes Nicolette Bang, MD  amLODipine (NORVASC) 10 MG tablet Take 1 tablet (10 mg total) by mouth daily. 08/29/20 01/30/21 Yes Nicolette Bang, MD  lisinopril (ZESTRIL) 40 MG tablet Take 1 tablet (40 mg total) by mouth daily. 08/29/20 01/30/21 Yes Nicolette Bang, MD  levETIRAcetam (KEPPRA) 500 MG tablet Take 1 tablet (500 mg total) by mouth 2 (two) times daily. 01/30/21   Geradine Girt, DO    Allergies    Influenza vaccines  Review of Systems   Review of Systems  Unable to perform ROS: Mental status change    Physical Exam Updated Vital Signs BP 137/82 (BP Location: Left Arm)   Pulse 65   Temp 99 F (37.2 C) (Oral)   Resp 14   Ht '5\' 6"'$  (1.676 m)   Wt 104 kg   LMP 10/29/2014   SpO2 98%   BMI 37.01 kg/m   Physical Exam Vitals and nursing note reviewed.  Constitutional:      General: She is not in acute distress.    Appearance: She is well-developed. She is not diaphoretic.  HENT:     Head: Normocephalic and atraumatic.     Right Ear: External ear normal.     Left Ear: External ear normal.     Nose: Nose normal.     Mouth/Throat:     Mouth: Mucous membranes are moist.  Eyes:     General: No visual field deficit or scleral icterus.    Extraocular Movements: Extraocular movements intact.     Conjunctiva/sclera: Conjunctivae normal.     Pupils: Pupils are equal, round, and reactive to light.  Cardiovascular:     Rate and Rhythm: Normal rate and regular rhythm.     Heart sounds: Normal heart sounds. No murmur heard.   No friction rub. No gallop.  Pulmonary:     Effort: Pulmonary effort is normal. No respiratory distress.     Breath sounds: Normal breath sounds.  Abdominal:     General: Bowel sounds are normal. There is no distension.     Palpations: Abdomen is soft. There is no mass.     Tenderness: There is no abdominal tenderness.  There is no guarding.  Musculoskeletal:     Cervical back: Normal range of motion.  Skin:    General: Skin is warm and dry.  Neurological:     Mental Status:  She is alert and oriented to person, place, and time.     GCS: GCS eye subscore is 4. GCS verbal subscore is 4. GCS motor subscore is 6.     Cranial Nerves: No cranial nerve deficit, dysarthria or facial asymmetry.     Sensory: Sensation is intact.     Motor: Motor function is intact.     Coordination: Coordination is intact.     Comments: Word finding difficulty on exam.  Psychiatric:        Behavior: Behavior normal.    ED Results / Procedures / Treatments   Labs (all labs ordered are listed, but only abnormal results are displayed) Labs Reviewed  COMPREHENSIVE METABOLIC PANEL - Abnormal; Notable for the following components:      Result Value   Potassium 3.3 (*)    Glucose, Bld 110 (*)    All other components within normal limits  RAPID URINE DRUG SCREEN, HOSP PERFORMED - Abnormal; Notable for the following components:   Tetrahydrocannabinol POSITIVE (*)    All other components within normal limits  URINALYSIS, ROUTINE W REFLEX MICROSCOPIC - Abnormal; Notable for the following components:   Color, Urine STRAW (*)    Hgb urine dipstick MODERATE (*)    All other components within normal limits  BASIC METABOLIC PANEL - Abnormal; Notable for the following components:   Glucose, Bld 111 (*)    All other components within normal limits  CBG MONITORING, ED - Abnormal; Notable for the following components:   Glucose-Capillary 127 (*)    All other components within normal limits  I-STAT CHEM 8, ED - Abnormal; Notable for the following components:   Potassium 3.3 (*)    Glucose, Bld 108 (*)    All other components within normal limits  RESP PANEL BY RT-PCR (FLU A&B, COVID) ARPGX2  ETHANOL  PROTIME-INR  APTT  CBC  DIFFERENTIAL  HIV ANTIBODY (ROUTINE TESTING W REFLEX)  CREATININE, SERUM  LIPID PANEL  HEMOGLOBIN A1C   I-STAT BETA HCG BLOOD, ED ([facility], [facility], [facility] ONLY)    EKG EKG Interpretation  Date/Time:  Wednesday January 29 2021 14:22:44 EDT Ventricular Rate:  59 PR Interval:  178 QRS Duration: 93 QT Interval:  451 QTC Calculation: 447 R Axis:   92 Text Interpretation: Sinus rhythm Borderline right axis deviation Nonspecific repol abnormality, diffuse leads Confirmed by Randal Buba, April ([id number]) on 01/30/2021 9:34:26 AM ED ECG REPORT   Date: 01/29/2021  Rate: 59  Rhythm: normal sinus rhythm  QRS Axis: normal  Intervals: normal  ST/T Wave abnormalities: early repolarization  Conduction Disutrbances:none  Narrative Interpretation:   Old EKG Reviewed:    Radiology MR BRAIN WO CONTRAST  Result Date: 01/30/2021 CLINICAL DATA:  Initial evaluation for neuro deficit, stroke suspected. EXAM: MRI HEAD WITHOUT CONTRAST TECHNIQUE: Multiplanar, multiecho pulse sequences of the brain and surrounding structures were obtained without intravenous contrast. COMPARISON:  Prior MRI from earlier the same day. FINDINGS: Brain: Examination limited as the patient was unable to tolerate the full length of the study. Additionally, images are somewhat degraded by motion artifact. Cerebral volume within normal limits. 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 No abnormal foci of restricted diffusion to suggest acute or subacute infarct. Gray-white matter differentiation otherwise maintained. No other areas of remote cortical infarction. No foci of susceptibility artifact to suggest acute or chronic intracranial hemorrhage. No mass lesion, midline shift or mass effect. No hydrocephalus or extra-axial fluid collection. Empty sella again noted. Vascular: Major intracranial  vascular flow voids are grossly maintained at the skull base. Skull and upper cervical spine: Craniocervical junction grossly within normal limits. Bone marrow signal intensity normal. No scalp soft tissue  abnormality. Sinuses/Orbits: Globes and orbital soft tissues demonstrate no acute finding. Mild mucosal thickening noted within the ethmoidal air cells. Trace bilateral mastoid effusions, of doubtful significance. Other: None. IMPRESSION: 1. Stable MRI from earlier the same day. No acute intracranial abnormality. 2. 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 Electronically Signed   By: Jeannine Boga M.D.   On: 01/30/2021 00:42   MR BRAIN WO CONTRAST  Result Date: 01/29/2021 CLINICAL DATA:  Neuro deficit, acute stroke suspected. EXAM: MRI HEAD WITHOUT CONTRAST TECHNIQUE: Multiplanar, multiecho pulse sequences of the brain and surrounding structures were obtained without intravenous contrast. COMPARISON:  Same day CT exams. FINDINGS: Brain: No acute infarction, hemorrhage, hydrocephalus, extra-axial collection or mass lesion. Mild scattered T2/FLAIR hyperintensities within the white matter, which are nonspecific but most likely related to chronic microvascular ischemic disease. Partially empty and expanded sella. Vascular: Major arterial flow voids are maintained at the skull base. See CTA for further evaluation. Skull and upper cervical spine: Normal marrow signal. Sinuses/Orbits: Clear sinuses.  Unremarkable orbits. Other: Small left and trace right mastoid effusions. IMPRESSION: 1. No acute intracranial abnormality. Specifically, no acute infarct. 2. 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 3. Partially empty and expanded sella, which is often a normal anatomic variant but can be associated with idiopathic intracranial hypertension (pseudotumor cerebri). Electronically Signed   By: Margaretha Sheffield MD   On: 01/29/2021 14:26   MR BRAIN W CONTRAST  Result Date: 01/30/2021 CLINICAL DATA:  Seizure, nontraumatic (Age >= 41y) EXAM: MRI HEAD WITH CONTRAST TECHNIQUE: Multiplanar, multiecho pulse sequences of the brain and  surrounding structures were obtained with intravenous contrast. CONTRAST:  52m GADAVIST GADOBUTROL 1 MMOL/ML IV SOLN COMPARISON:  MRI head from yesterday. FINDINGS: Postcontrast imaging only was performed given recent MRI from yesterday. No abnormal enhancement. Incidental developmental venous anomaly in the left parietal lobe. IMPRESSION: No abnormal enhancement. Electronically Signed   By: FMargaretha SheffieldMD   On: 01/30/2021 14:17   CT CEREBRAL PERFUSION W CONTRAST  Result Date: 01/29/2021 CLINICAL DATA:  Right facial droop. Aphasia. Code stroke. Neuro deficit, acute, stroke suspected. EXAM: CT ANGIOGRAPHY HEAD AND NECK CT PERFUSION BRAIN TECHNIQUE: Multidetector CT imaging of the head and neck was performed using the standard protocol during bolus administration of intravenous contrast. Multiplanar CT image reconstructions and MIPs were obtained to evaluate the vascular anatomy. Carotid stenosis measurements (when applicable) are obtained utilizing NASCET criteria, using the distal internal carotid diameter as the denominator. Multiphase CT imaging of the brain was performed following IV bolus contrast injection. Subsequent parametric perfusion maps were calculated using RAPID software. CONTRAST:  1064mOMNIPAQUE IOHEXOL 350 MG/ML SOLN; 4037mMNIPAQUE IOHEXOL 350 MG/ML SOLN COMPARISON:  None. FINDINGS: CTA NECK FINDINGS Aortic arch: The left vertebral artery originates directly from the aortic arch. Mild atherosclerotic calcifications are present at the arch without significant stenosis of the great vessels. No aneurysm is present. Right carotid system: The right common carotid artery is within normal limits. Bifurcation is unremarkable. Minimal calcification is present at the bifurcation. Cervical right ICA is normal. Left carotid system: The left common carotid artery is mildly tortuous without significant stenosis. Bifurcation is unremarkable. Cervical left ICA normal. Vertebral arteries: The right  vertebral artery is the dominant vessel. Both vertebral arteries originate from the scratched  at the right vertebral artery originates from the subclavian artery. There is no significant stenosis of either vertebral artery. Skeleton: Degenerative endplate changes are most evident C5-6 and C6-7. No focal lytic or blastic lesions are present. Patient is status post median sternotomy. Other neck: Soft tissues the neck are otherwise unremarkable. Salivary glands are within normal limits. Thyroid is normal. No significant adenopathy is present. No focal mucosal or submucosal lesions are present. Upper chest: Minimal dependent atelectasis is present in the lungs. Thoracic inlet is normal. Review of the MIP images confirms the above findings CTA HEAD FINDINGS Anterior circulation: Atherosclerotic changes are present within the cavernous internal carotid arteries bilaterally without significant stenosis through the ICA termini. The A1 and M1 segments are normal. The anterior communicating artery is patent. MCA bifurcations are within normal limits. ACA and MCA branch vessels are normal. Posterior circulation: The right vertebral artery is slightly dominant to the left. PICA origins are visualized and normal. Vertebrobasilar junction is normal. The basilar artery is normal. Both posterior cerebral arteries originate from the basilar tip. PCA branch vessels are within normal limits bilaterally. Venous sinuses: The dural sinuses are patent. The straight sinus deep cerebral veins are intact. Cortical veins are within normal limits. No significant vascular malformation is evident. Anatomic variants: None Review of the MIP images confirms the above findings CT Brain Perfusion Findings: ASPECTS: 10/10 CBF (<30%) Volume: 63m Perfusion (Tmax>6.0s) volume: 083mMismatch Volume: 3m46mMPRESSION: 1. Normal variant CTA Circle of Willis without significant proximal stenosis, aneurysm, or branch vessel occlusion. 2. Minimal atherosclerotic  changes at the right carotid bifurcation and cavernous internal carotid arteries bilaterally without significant stenosis. 3. Minimal degenerative changes in the cervical spine. 4. CT perfusion demonstrates no evidence for acute infarct or significant ischemia. The above was relayed via text pager to Dr. ERIKerney Elbe 01/29/2021 at 13:48 . Electronically Signed   By: ChrSan MorelleD.   On: 01/29/2021 13:53   EEG adult  Result Date: 01/29/2021 YadLora HavensD     01/29/2021  6:33 PM Patient Name: FanSamauria GlaabN: [id number] Attending: PriLora Havensferring Physician/Provider: Dr RylMitzi Hansente: 01/29/2021 Duration: 25.20 mins Patient history: 55yo F with ams. EEG to evaluate for seizure Level of alertness: Awake AEDs during EEG study: None Technical aspects: This EEG study was done with scalp electrodes positioned according to the 10-20 International system of electrode placement. Electrical activity was acquired at a sampling rate of '500Hz'$  and reviewed with a high frequency filter of '70Hz'$  and a low frequency filter of '1Hz'$ . EEG data were recorded continuously and digitally stored. Description: The posterior dominant rhythm consists of 9 Hz activity of moderate voltage (25-35 uV) seen predominantly in posterior head regions, symmetric and reactive to eye opening and eye closing. EEG showed intermittent 2-'3hz'$  delta slowing in left frontotemporal region. Spikes was also noted in left frontotemporal region. Hyperventilation and photic stimulation were not performed.   ABNORMALITY -Spike,left frontotemporal region. - Intermittent slow, generalized, left frontotemporal region. 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 No seizures were seen throughout the recording. PriLora HavensECHOCARDIOGRAM COMPLETE  Result Date: 01/30/2021    ECHOCARDIOGRAM REPORT   Patient Name:   FANELLNORA ROCKMOREate of Exam: 01/30/2021 Medical Rec #:  [id number]     Height:       66.0 in Accession #:    [id number]  Weight:       229.3 lb Date of Birth:  July 15, 1965        BSA:          2.119 m Patient Age:    68 years         BP:           102/57 mmHg Patient Gender: F                HR:           61 bpm. Exam Location:  Inpatient Procedure: 2D Echo, Cardiac Doppler and Color Doppler Indications:    TIA G45.9  History:        Patient has prior history of Echocardiogram examinations, most                 recent 12/14/2014. CHF; Risk Factors:Hypertension. Aortic                 dissection repair 5/16.  Sonographer:    Vickie Epley RDCS Referring Phys: Theola Sequin IMPRESSIONS  1. Left ventricular ejection fraction, by estimation, is 60 to 65%. The left ventricle has normal function. The left ventricle has no regional wall motion abnormalities. There is mild concentric left ventricular hypertrophy. Left ventricular diastolic parameters were normal.  2. Right ventricular systolic function is normal. The right ventricular size is normal. Tricuspid regurgitation signal is inadequate for assessing PA pressure.  3. The mitral valve is grossly normal. No evidence of mitral valve regurgitation. No evidence of mitral stenosis.  4. The aortic valve is tricuspid. Aortic valve regurgitation is not visualized. No aortic stenosis is present.  5. S/p ascending aortic repair. Normal measurements of the aorta are noted. Aortic root/ascending aorta has been repaired/replaced.  6. The inferior vena cava is normal in size with greater than 50% respiratory variability, suggesting right atrial pressure of 3 mmHg. Conclusion(s)/Recommendation(s): No intracardiac source of embolism detected on this transthoracic study. A transesophageal echocardiogram is recommended to exclude cardiac source of embolism if clinically indicated. FINDINGS  Left Ventricle: Left ventricular ejection fraction, by estimation, is 60 to 65%. The left ventricle has  normal function. The left ventricle has no regional wall motion abnormalities. The left ventricular internal cavity size was normal in size. There is  mild concentric left ventricular hypertrophy. Abnormal (paradoxical) septal motion consistent with post-operative status. Left ventricular diastolic parameters were normal. Right Ventricle: The right ventricular size is normal. No increase in right ventricular wall thickness. Right ventricular systolic function is normal. Tricuspid regurgitation signal is inadequate for assessing PA pressure. Left Atrium: Left atrial size was normal in size. Right Atrium: Right atrial size was normal in size. Pericardium: Trivial pericardial effusion is present. Mitral Valve: The mitral valve is grossly normal. Mild mitral annular calcification. No evidence of mitral valve regurgitation. No evidence of mitral valve stenosis. Tricuspid Valve: The tricuspid valve is grossly normal. Tricuspid valve regurgitation is trivial. No evidence of tricuspid stenosis. Aortic Valve: The aortic valve is tricuspid. Aortic valve regurgitation is not visualized. No aortic stenosis is present. Pulmonic Valve: The pulmonic valve was grossly normal. Pulmonic valve regurgitation is not visualized. No evidence of pulmonic stenosis. Aorta: S/p ascending aortic repair. Normal measurements of the aorta are noted. The aortic root/ascending aorta has been repaired/replaced. Venous: The inferior vena cava is normal in size with greater than 50% respiratory variability, suggesting right atrial pressure of 3 mmHg. IAS/Shunts: The atrial septum is grossly normal.  LEFT VENTRICLE PLAX 2D LVIDd:  3.40 cm      Diastology LVIDs:         2.40 cm      LV e' medial:    7.83 cm/s LV PW:         1.20 cm      LV E/e' medial:  10.1 LV IVS:        1.30 cm      LV e' lateral:   9.90 cm/s LVOT diam:     2.10 cm      LV E/e' lateral: 8.0 LV SV:         95 LV SV Index:   45 LVOT Area:     3.46 cm  LV Volumes (MOD) LV vol d,  MOD A2C: 105.0 ml LV vol d, MOD A4C: 114.0 ml LV vol s, MOD A2C: 39.3 ml LV vol s, MOD A4C: 35.8 ml LV SV MOD A2C:     65.7 ml LV SV MOD A4C:     114.0 ml LV SV MOD BP:      74.0 ml RIGHT VENTRICLE RV S prime:     11.00 cm/s TAPSE (M-mode): 1.9 cm LEFT ATRIUM           Index       RIGHT ATRIUM           Index LA diam:      3.90 cm 1.84 cm/m  RA Area:     16.80 cm LA Vol (A2C): 51.4 ml 24.25 ml/m RA Volume:   39.10 ml  18.45 ml/m LA Vol (A4C): 57.0 ml 26.89 ml/m  AORTIC VALVE LVOT Vmax:   128.00 cm/s LVOT Vmean:  90.800 cm/s LVOT VTI:    0.274 m  AORTA Ao Root diam: 3.40 cm Ao Asc diam:  3.30 cm Ao Desc diam: 2.70 cm MITRAL VALVE MV Area (PHT): 2.58 cm    SHUNTS MV Decel Time: 294 msec    Systemic VTI:  0.27 m MV E velocity: 79.30 cm/s  Systemic Diam: 2.10 cm MV A velocity: 87.80 cm/s MV E/A ratio:  0.90 Eleonore Chiquito MD Electronically signed by Eleonore Chiquito MD Signature Date/Time: 01/30/2021/10:41:17 AM    Final    CT HEAD CODE STROKE WO CONTRAST  Result Date: 01/29/2021 CLINICAL DATA:  Code stroke. Aphasia. Right facial droop. Neuro deficit, acute, stroke suspected. EXAM: CT HEAD WITHOUT CONTRAST TECHNIQUE: Contiguous axial images were obtained from the base of the skull through the vertex without intravenous contrast. COMPARISON:  None. FINDINGS: Brain: Posterior right ICA scratched at posterior right cerebellar hypodensity noted. No other focal cortical hypoattenuation present. No significant white matter disease. The ventricles are of normal size. No significant extraaxial fluid collection is present. Brainstem and cerebellum are otherwise within normal limits. Vascular: No hyperdense vessel or unexpected calcification. Skull: Calvarium is intact. No focal lytic or blastic lesions are present. No significant extracranial soft tissue lesion is present. Sinuses/Orbits: The paranasal sinuses and mastoid air cells are clear. The globes and orbits are within normal limits. ASPECTS [facility]  Early CT Score) - Ganglionic level infarction (caudate, lentiform nuclei, internal capsule, insula, M1-M3 cortex): 7/7 - Supraganglionic infarction (M4-M6 cortex): 3/3 Total score (0-10 with 10 being normal): 10/10 IMPRESSION: 1. Posterior right cerebellar hypodensity consistent with age indeterminate nonhemorrhagic infarct. 2. ASPECTS is 10/10. The above was relayed via text pager to Dr. Cheral Marker on 01/29/2021 at 13:27 . Electronically Signed   By: San Morelle M.D.   On: 01/29/2021 13:27   CT ANGIO HEAD  NECK W WO CM (CODE STROKE)  Result Date: 01/29/2021 CLINICAL DATA:  Right facial droop. Aphasia. Code stroke. Neuro deficit, acute, stroke suspected. EXAM: CT ANGIOGRAPHY HEAD AND NECK CT PERFUSION BRAIN TECHNIQUE: Multidetector CT imaging of the head and neck was performed using the standard protocol during bolus administration of intravenous contrast. Multiplanar CT image reconstructions and MIPs were obtained to evaluate the vascular anatomy. Carotid stenosis measurements (when applicable) are obtained utilizing NASCET criteria, using the distal internal carotid diameter as the denominator. Multiphase CT imaging of the brain was performed following IV bolus contrast injection. Subsequent parametric perfusion maps were calculated using RAPID software. CONTRAST:  147m OMNIPAQUE IOHEXOL 350 MG/ML SOLN; 483mOMNIPAQUE IOHEXOL 350 MG/ML SOLN COMPARISON:  None. FINDINGS: CTA NECK FINDINGS Aortic arch: The left vertebral artery originates directly from the aortic arch. Mild atherosclerotic calcifications are present at the arch without significant stenosis of the great vessels. No aneurysm is present. Right carotid system: The right common carotid artery is within normal limits. Bifurcation is unremarkable. Minimal calcification is present at the bifurcation. Cervical right ICA is normal. Left carotid system: The left common carotid artery is mildly tortuous without significant stenosis. Bifurcation is  unremarkable. Cervical left ICA normal. Vertebral arteries: The right vertebral artery is the dominant vessel. Both vertebral arteries originate from the scratched at the right vertebral artery originates from the subclavian artery. There is no significant stenosis of either vertebral artery. Skeleton: Degenerative endplate changes are most evident C5-6 and C6-7. No focal lytic or blastic lesions are present. Patient is status post median sternotomy. Other neck: Soft tissues the neck are otherwise unremarkable. Salivary glands are within normal limits. Thyroid is normal. No significant adenopathy is present. No focal mucosal or submucosal lesions are present. Upper chest: Minimal dependent atelectasis is present in the lungs. Thoracic inlet is normal. Review of the MIP images confirms the above findings CTA HEAD FINDINGS Anterior circulation: Atherosclerotic changes are present within the cavernous internal carotid arteries bilaterally without significant stenosis through the ICA termini. The A1 and M1 segments are normal. The anterior communicating artery is patent. MCA bifurcations are within normal limits. ACA and MCA branch vessels are normal. Posterior circulation: The right vertebral artery is slightly dominant to the left. PICA origins are visualized and normal. Vertebrobasilar junction is normal. The basilar artery is normal. Both posterior cerebral arteries originate from the basilar tip. PCA branch vessels are within normal limits bilaterally. Venous sinuses: The dural sinuses are patent. The straight sinus deep cerebral veins are intact. Cortical veins are within normal limits. No significant vascular malformation is evident. Anatomic variants: None Review of the MIP images confirms the above findings CT Brain Perfusion Findings: ASPECTS: 10/10 CBF (<30%) Volume: 89m4merfusion (Tmax>6.0s) volume: 89mL65msmatch Volume: 89mL 54mRESSION: 1. Normal variant CTA Circle of Willis without significant proximal  stenosis, aneurysm, or branch vessel occlusion. 2. Minimal atherosclerotic changes at the right carotid bifurcation and cavernous internal carotid arteries bilaterally without significant stenosis. 3. Minimal degenerative changes in the cervical spine. 4. CT perfusion demonstrates no evidence for acute infarct or significant ischemia. The above was relayed via text pager to Dr. ERIC Kerney Elbe/27/2022 at 13:48 . Electronically Signed   By: ChrisSan Morelle   On: 01/29/2021 13:53    Procedures Procedures   Medications Ordered in ED Medications  acetaminophen (TYLENOL) tablet 500 mg (500 mg Oral Given 01/29/21 2104)  carvedilol (COREG) tablet 25 mg (25 mg Oral Given 01/30/21 0954)  lisinopril (ZESTRIL) tablet 40 mg (  40 mg Oral Given 01/30/21 0954)  simvastatin (ZOCOR) tablet 20 mg (20 mg Oral Given 01/30/21 0954)   stroke: mapping our early stages of recovery book ( Does not apply Not Given 01/30/21 0741)  senna-docusate (Senokot-S) tablet 1 tablet (has no administration in time range)  enoxaparin (LOVENOX) injection 40 mg (40 mg Subcutaneous Given 01/29/21 2054)  aspirin EC tablet 81 mg (81 mg Oral Given 01/30/21 0954)  ondansetron (ZOFRAN) injection 4 mg (4 mg Intravenous Given 01/30/21 0034)  levETIRAcetam (KEPPRA) tablet 500 mg (has no administration in time range)  iohexol (OMNIPAQUE) 350 MG/ML injection 100 mL (100 mLs Intravenous Contrast Given 01/29/21 1336)  iohexol (OMNIPAQUE) 350 MG/ML injection 50 mL (40 mLs Intravenous Contrast Given 01/29/21 1339)  potassium chloride SA (KLOR-CON) CR tablet 40 mEq (40 mEq Oral Given 01/29/21 1852)  LORazepam (ATIVAN) 2 MG/ML injection (1 mg  Given 01/29/21 2148)  levETIRAcetam (KEPPRA) IVPB 1000 mg/100 mL premix (0 mg Intravenous Stopped 01/29/21 2255)  ketorolac (TORADOL) 30 MG/ML injection 30 mg (30 mg Intravenous Given 01/30/21 0036)  gadobutrol (GADAVIST) 1 MMOL/ML injection 10 mL (10 mLs Intravenous Contrast Given 01/30/21 1407)    ED Course   I have reviewed the triage vital signs and the nursing notes.  Pertinent labs & imaging results that were available during my care of the patient were reviewed by me and considered in my medical decision making (see chart for details).    MDM Rules/Calculators/A&P                          55 year old female here brought in as code stroke. The differential diagnosis for AMS is extensive and includes, but is not limited to: drug overdose - opioids, alcohol, sedatives, antipsychotics, drug withdrawal, others; Metabolic: hypoxia, hypoglycemia, hyperglycemia, hypercalcemia, hypernatremia, hyponatremia, uremia, hepatic encephalopathy, hypothyroidism, hyperthyroidism, vitamin B12 or thiamine deficiency, carbon monoxide poisoning, Wilson's disease, Lactic acidosis,DKA/HHOS; Infectious: meningitis, encephalitis, bacteremia/sepsis, urinary tract infection, pneumonia, neurosyphilis; Structural: Space-occupying lesion, (brain tumor, subdural hematoma, hydrocephalus,); Vascular: stroke, subarachnoid hemorrhage, coronary ischemia, hypertensive encephalopathy, CNS vasculitis, thrombotic thrombocytopenic purpura, disseminated intravascular coagulation, hyperviscosity; Psychiatric: Schizophrenia, depression; Other: Seizure, hypothermia, heat stroke, ICU psychosis, dementia -"sundowning."   Work-up included labs: CBC without significant abnormality, ethanol, PT/INR, APTT, COVID screening all within normal limits.  Urine appears contaminated without evidence of infection.  UDS positive for THC.  CMP with mildly low potassium of insignificant value.  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  Case discussed with Dr. Cheral Marker who wants the patient to have full TIA work-up and EEG with concerned that her symptoms may have been related to seizure.  Patient is unhappy about  being hospitalized but willing to do so.  She is stable throughout her ED visit with improved mentation.  Final Clinical Impression(s) / ED Diagnoses Final diagnoses:  Aphasia    Rx / DC Orders ED Discharge Orders          Ordered    levETIRAcetam (KEPPRA) 500 MG tablet  2 times daily        01/30/21 1444    Increase activity slowly        01/30/21 1444    Diet - low sodium heart healthy        01/30/21 1444    Discharge instructions       Comments: Per [facility]  statutes, patients with seizures are not allowed to drive until they have been seizure-free for six months.    Use caution when using heavy equipment or power tools. Avoid working on ladders or at heights. Take showers instead of baths. Ensure the water temperature is not too high on the home water heater. Do not go swimming alone. Do not lock yourself in a room alone (i.e. bathroom). When caring for infants or small children, sit down when holding, feeding, or changing them to minimize risk of injury to the child in the event you have a seizure. Maintain good sleep hygiene. Avoid alcohol.    If patient has another seizure, call 911 and bring them back to the ED if: A.  The seizure lasts longer than 5 minutes.      B.  The patient doesn't wake shortly after the seizure or has new problems such as difficulty seeing, speaking or moving following the seizure C.  The patient was injured during the seizure D.  The patient has a temperature over 102 F (39C) E.  The patient vomited during the seizure and now is having trouble breathing   01/30/21 1444    Ambulatory referral to Neurology       Comments: An appointment is requested in approximately: 8 weeks   01/30/21 1446             Margarita Mail, PA-C 01/30/21 1628    Charlesetta Shanks, MD 01/31/21 1530

## 2021-01-29 NOTE — ED Notes (Signed)
EEG at bedside.

## 2021-01-29 NOTE — Progress Notes (Signed)
Neurologist on-call note  I was called by the patient's RN to reevaluate the patient somewhere around 9:45 PM when the patient started to exhibit some word salad. On my examination, she is awake alert.  Was able to tell me her name.  She told me her is to be 25 years.  She could not tell me where she was. Her speech was rather bizarre: She was making complete sentences that make complete sense but they were completely disconnected to the question being asked. No cranial nerve, motor or sensory deficits.  Given that her EEG had some left-sided focality, I gave her 1 mg of Ativan and a load of Keppra IV.  I also ordered a stat repeat MRI of the brain given the history of aortic dissection and aortic valve repair to see if she is embolizing-prior MRI, CT head, CTA head and neck and CT perfusion studies done about 8 hours ago are unremarkable for any acute process.  If the repeat MRI also does not show any ongoing abnormality, might hook her up with rapid EEG overnight for some continuous electrographic monitoring.  Discussed with the bedside RN, rapid response RN and relayed my plan to the covering on-call hospitalist.  -- Amie Portland, MD Neurologist [facility] Pager: [phone]

## 2021-01-29 NOTE — ED Notes (Signed)
Dinner tray ordered.

## 2021-01-29 NOTE — ED Notes (Addendum)
Dr. Malen Gauze at bedside '1mg'$  ativan given

## 2021-01-30 ENCOUNTER — Observation Stay (HOSPITAL_COMMUNITY): Payer: 59

## 2021-01-30 ENCOUNTER — Other Ambulatory Visit (HOSPITAL_COMMUNITY): Payer: Self-pay

## 2021-01-30 ENCOUNTER — Observation Stay (HOSPITAL_BASED_OUTPATIENT_CLINIC_OR_DEPARTMENT_OTHER): Payer: 59

## 2021-01-30 DIAGNOSIS — G40009 Localization-related (focal) (partial) idiopathic epilepsy and epileptic syndromes with seizures of localized onset, not intractable, without status epilepticus: Secondary | ICD-10-CM

## 2021-01-30 DIAGNOSIS — R569 Unspecified convulsions: Secondary | ICD-10-CM | POA: Diagnosis not present

## 2021-01-30 DIAGNOSIS — G459 Transient cerebral ischemic attack, unspecified: Secondary | ICD-10-CM

## 2021-01-30 LAB — ECHOCARDIOGRAM COMPLETE
Area-P 1/2: 2.58 cm2
Calc EF: 66.3 %
Height: 66 in
S' Lateral: 2.4 cm
Single Plane A2C EF: 62.6 %
Single Plane A4C EF: 68.6 %
Weight: 3668.45 oz

## 2021-01-30 LAB — BASIC METABOLIC PANEL
Anion gap: 6 (ref 5–15)
BUN: 13 mg/dL (ref 6–20)
CO2: 28 mmol/L (ref 22–32)
Calcium: 9.2 mg/dL (ref 8.9–10.3)
Chloride: 103 mmol/L (ref 98–111)
Creatinine, Ser: 0.94 mg/dL (ref 0.44–1.00)
GFR, Estimated: >60 mL/min (ref >60)
Glucose, Bld: 111 mg/dL — ABNORMAL HIGH (ref 70–99)
Potassium: 3.7 mmol/L (ref 3.5–5.1)
Sodium: 137 mmol/L (ref 135–145)

## 2021-01-30 LAB — LIPID PANEL
Cholesterol: 162 mg/dL (ref 0–200)
HDL: 42 mg/dL (ref >40)
LDL Cholesterol: 97 mg/dL (ref 0–99)
Total CHOL/HDL Ratio: 3.9 RATIO
Triglycerides: 115 mg/dL (ref <150)
VLDL: 23 mg/dL (ref 0–40)

## 2021-01-30 LAB — CREATININE, SERUM
Creatinine, Ser: 0.88 mg/dL (ref 0.44–1.00)
GFR, Estimated: >60 mL/min (ref >60)

## 2021-01-30 LAB — HIV ANTIBODY (ROUTINE TESTING W REFLEX): HIV Screen 4th Generation wRfx: NONREACTIVE

## 2021-01-30 MED ORDER — LEVETIRACETAM 500 MG PO TABS
500.0000 mg | ORAL_TABLET | Freq: Two times a day (BID) | ORAL | 1 refills | Status: DC
  Filled 2021-01-30: qty 60, 30d supply, fill #0

## 2021-01-30 MED ORDER — GADOBUTROL 1 MMOL/ML IV SOLN
10.0000 mL | Freq: Once | INTRAVENOUS | Status: AC | PRN
  Administered 2021-01-30: 10 mL via INTRAVENOUS

## 2021-01-30 MED ORDER — LEVETIRACETAM 500 MG PO TABS: 500.0000 mg | ORAL_TABLET | Freq: Two times a day (BID) | ORAL | Status: DC

## 2021-01-30 NOTE — Progress Notes (Signed)
MRI brain limited-DWI/flair/GRE-no evidence of acute stroke. Patient feeling better-although she had an episode of vomiting in the MRI scanner, her mentation has improved. Reports a headache that she was having before. At this time, do not see the need for emergent or stat rapid EEG to be hooked up. I would recommend repeat EEG in the morning and may be continuous EEG once the technologist are in-house. For now, continue antiepileptics-Keppra 500 twice daily. Frequent neurochecks. Neurology will follow  -- Amie Portland, MD Neurologist [facility] Pager: [phone]

## 2021-01-30 NOTE — Discharge Instructions (Signed)
Monitor blood pressure at home-- holding lisinopril and norvasc as BP normal in ER on your other meds-- resume lisinopril if SBP > 130

## 2021-01-30 NOTE — Discharge Summary (Signed)
Physician Discharge Summary  [facility] [id number] DOB: Feb 13, 1966 DOA: 01/29/2021  PCP: Nicolette Bang, MD  Admit date: 01/29/2021 Discharge date: 01/30/2021  Admitted From: home Discharge disposition: home   Recommendations for Outpatient Follow-Up:   Seizure precautions Referral to neurology re: seizures BP medications adjusted as BP on lower end of normal   Discharge Diagnosis:   Active Problems:   seizures   Essential hypertension    Discharge Condition: Improved.  Diet recommendation: Low sodium, heart healthy Wound care: None.  Code status: Full.   History of Present Illness:   Ashley Guerrero is a 56 y.o. female with medical history significant of hypertension, hyperlipidemia, chronic diastolic heart failure, h/o aortic dissection s/p repair was working in the [employer]	[place] at [employer], when her colleagues noticed that she was staring and talking without making sense. She was brought in to ED as code stroke, and was evaluated by neurology, recommended medical admission for evaluation of TIA and seizures. She denies any headache, nausea, vomiting or abdominal pain. She denies chest pain or sob, cough, fever or chills . She denies remembering what has happened, but she remembers coming down to the ED.  She denies dysuria, hematemesis, dizziness, syncope or orthopnea or PND.      Hospital Course by Problem:   New onset focal epilepsy, left frontotemporal region -EEG showed intermittent slowing in left frontotemporal region as well as spikes in left frontotemporal region   Recommendation -Continue Keppra 500 mg twice daily -We will obtain MRI with contrast to look for any acute abnormality -If no further seizures and MRI brain does not show any acute abnormality, patient is stable for discharge from neurology standpoint -Discussed seizure risk factors, side effect of Keppra with patient -Discussed seizure precautions including do not  drive -We will need follow-up with neurology in 10 to 12 weeks      Medical Consultants:    neurology  Discharge Exam:   Vitals:   01/30/21 0900 01/30/21 1230  BP: (!) 121/55 119/65  Pulse: 73 (!) 57  Resp: (!) 26 17  Temp:    SpO2: 100% 95%   Vitals:   01/30/21 0426 01/30/21 0445 01/30/21 0900 01/30/21 1230  BP:  (!) 102/57 (!) 121/55 119/65  Pulse:  67 73 (!) 57  Resp:  16 (!) 26 17  Temp: 99.2 F (37.3 C)     TempSrc: Oral     SpO2:  91% 100% 95%  Weight:      Height:        General exam: Appears calm and comfortable.  The results of significant diagnostics from this hospitalization (including imaging, microbiology, ancillary and laboratory) are listed below for reference.     Procedures and Diagnostic Studies:   MR BRAIN WO CONTRAST  Result Date: 01/30/2021 CLINICAL DATA:  Initial evaluation for neuro deficit, stroke suspected. EXAM: MRI HEAD WITHOUT CONTRAST TECHNIQUE: Multiplanar, multiecho pulse sequences of the brain and surrounding structures were obtained without intravenous contrast. COMPARISON:  Prior MRI from earlier the same day. FINDINGS: Brain: Examination limited as the patient was unable to tolerate the full length of the study. Additionally, images are somewhat degraded by motion artifact. Cerebral volume within normal limits. 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 No abnormal foci of restricted diffusion to suggest acute or subacute infarct. Gray-white matter differentiation otherwise maintained. No other areas of remote cortical infarction. No foci of susceptibility artifact to suggest acute or chronic intracranial hemorrhage.  No mass lesion, midline shift or mass effect. No hydrocephalus or extra-axial fluid collection. Empty sella again noted. Vascular: Major intracranial vascular flow voids are grossly maintained at the skull base. Skull and upper cervical spine: Craniocervical junction  grossly within normal limits. Bone marrow signal intensity normal. No scalp soft tissue abnormality. Sinuses/Orbits: Globes and orbital soft tissues demonstrate no acute finding. Mild mucosal thickening noted within the ethmoidal air cells. Trace bilateral mastoid effusions, of doubtful significance. Other: None. IMPRESSION: 1. Stable MRI from earlier the same day. No acute intracranial abnormality. 2. 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 Electronically Signed   By: Jeannine Boga M.D.   On: 01/30/2021 00:42   MR BRAIN WO CONTRAST  Result Date: 01/29/2021 CLINICAL DATA:  Neuro deficit, acute stroke suspected. EXAM: MRI HEAD WITHOUT CONTRAST TECHNIQUE: Multiplanar, multiecho pulse sequences of the brain and surrounding structures were obtained without intravenous contrast. COMPARISON:  Same day CT exams. FINDINGS: Brain: No acute infarction, hemorrhage, hydrocephalus, extra-axial collection or mass lesion. Mild scattered T2/FLAIR hyperintensities within the white matter, which are nonspecific but most likely related to chronic microvascular ischemic disease. Partially empty and expanded sella. Vascular: Major arterial flow voids are maintained at the skull base. See CTA for further evaluation. Skull and upper cervical spine: Normal marrow signal. Sinuses/Orbits: Clear sinuses.  Unremarkable orbits. Other: Small left and trace right mastoid effusions. IMPRESSION: 1. No acute intracranial abnormality. Specifically, no acute infarct. 2. 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 3. Partially empty and expanded sella, which is often a normal anatomic variant but can be associated with idiopathic intracranial hypertension (pseudotumor cerebri). Electronically Signed   By: Margaretha Sheffield MD   On: 01/29/2021 14:26   MR BRAIN W CONTRAST  Result Date: 01/30/2021 CLINICAL DATA:  Seizure, nontraumatic (Age >= 41y) EXAM: MRI  HEAD WITH CONTRAST TECHNIQUE: Multiplanar, multiecho pulse sequences of the brain and surrounding structures were obtained with intravenous contrast. CONTRAST:  51m GADAVIST GADOBUTROL 1 MMOL/ML IV SOLN COMPARISON:  MRI head from yesterday. FINDINGS: Postcontrast imaging only was performed given recent MRI from yesterday. No abnormal enhancement. Incidental developmental venous anomaly in the left parietal lobe. IMPRESSION: No abnormal enhancement. Electronically Signed   By: FMargaretha SheffieldMD   On: 01/30/2021 14:17   CT CEREBRAL PERFUSION W CONTRAST  Result Date: 01/29/2021 CLINICAL DATA:  Right facial droop. Aphasia. Code stroke. Neuro deficit, acute, stroke suspected. EXAM: CT ANGIOGRAPHY HEAD AND NECK CT PERFUSION BRAIN TECHNIQUE: Multidetector CT imaging of the head and neck was performed using the standard protocol during bolus administration of intravenous contrast. Multiplanar CT image reconstructions and MIPs were obtained to evaluate the vascular anatomy. Carotid stenosis measurements (when applicable) are obtained utilizing NASCET criteria, using the distal internal carotid diameter as the denominator. Multiphase CT imaging of the brain was performed following IV bolus contrast injection. Subsequent parametric perfusion maps were calculated using RAPID software. CONTRAST:  106mOMNIPAQUE IOHEXOL 350 MG/ML SOLN; 4080mMNIPAQUE IOHEXOL 350 MG/ML SOLN COMPARISON:  None. FINDINGS: CTA NECK FINDINGS Aortic arch: The left vertebral artery originates directly from the aortic arch. Mild atherosclerotic calcifications are present at the arch without significant stenosis of the great vessels. No aneurysm is present. Right carotid system: The right common carotid artery is within normal limits. Bifurcation is unremarkable. Minimal calcification is present at the bifurcation. Cervical right ICA is normal. Left carotid system: The left common carotid artery is mildly tortuous without significant stenosis.  Bifurcation is unremarkable.  Cervical left ICA normal. Vertebral arteries: The right vertebral artery is the dominant vessel. Both vertebral arteries originate from the scratched at the right vertebral artery originates from the subclavian artery. There is no significant stenosis of either vertebral artery. Skeleton: Degenerative endplate changes are most evident C5-6 and C6-7. No focal lytic or blastic lesions are present. Patient is status post median sternotomy. Other neck: Soft tissues the neck are otherwise unremarkable. Salivary glands are within normal limits. Thyroid is normal. No significant adenopathy is present. No focal mucosal or submucosal lesions are present. Upper chest: Minimal dependent atelectasis is present in the lungs. Thoracic inlet is normal. Review of the MIP images confirms the above findings CTA HEAD FINDINGS Anterior circulation: Atherosclerotic changes are present within the cavernous internal carotid arteries bilaterally without significant stenosis through the ICA termini. The A1 and M1 segments are normal. The anterior communicating artery is patent. MCA bifurcations are within normal limits. ACA and MCA branch vessels are normal. Posterior circulation: The right vertebral artery is slightly dominant to the left. PICA origins are visualized and normal. Vertebrobasilar junction is normal. The basilar artery is normal. Both posterior cerebral arteries originate from the basilar tip. PCA branch vessels are within normal limits bilaterally. Venous sinuses: The dural sinuses are patent. The straight sinus deep cerebral veins are intact. Cortical veins are within normal limits. No significant vascular malformation is evident. Anatomic variants: None Review of the MIP images confirms the above findings CT Brain Perfusion Findings: ASPECTS: 10/10 CBF (<30%) Volume: 25m Perfusion (Tmax>6.0s) volume: 047mMismatch Volume: 63m37mMPRESSION: 1. Normal variant CTA Circle of Willis without significant  proximal stenosis, aneurysm, or branch vessel occlusion. 2. Minimal atherosclerotic changes at the right carotid bifurcation and cavernous internal carotid arteries bilaterally without significant stenosis. 3. Minimal degenerative changes in the cervical spine. 4. CT perfusion demonstrates no evidence for acute infarct or significant ischemia. The above was relayed via text pager to Dr. ERIKerney Elbe 01/29/2021 at 13:48 . Electronically Signed   By: ChrSan MorelleD.   On: 01/29/2021 13:53   EEG adult  Result Date: 01/29/2021 YadLora HavensD     01/29/2021  6:33 PM Patient Name: Ashley GutheryN: [id number] Attending: PriLora Havensferring Physician/Provider: Dr RylMitzi Hansente: 01/29/2021 Duration: 25.20 mins Patient history: 55yo F with ams. EEG to evaluate for seizure Level of alertness: Awake AEDs during EEG study: None Technical aspects: This EEG study was done with scalp electrodes positioned according to the 10-20 International system of electrode placement. Electrical activity was acquired at a sampling rate of '500Hz'$  and reviewed with a high frequency filter of '70Hz'$  and a low frequency filter of '1Hz'$ . EEG data were recorded continuously and digitally stored. Description: The posterior dominant rhythm consists of 9 Hz activity of moderate voltage (25-35 uV) seen predominantly in posterior head regions, symmetric and reactive to eye opening and eye closing. EEG showed intermittent 2-'3hz'$  delta slowing in left frontotemporal region. Spikes was also noted in left frontotemporal region. Hyperventilation and photic stimulation were not performed.   ABNORMALITY -Spike,left frontotemporal region. - Intermittent slow, generalized, left frontotemporal region. 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 No seizures were seen throughout the recording. PriLora HavensECHOCARDIOGRAM  COMPLETE  Result Date: 01/30/2021    ECHOCARDIOGRAM REPORT   Patient Name:   Ashley GAROFALOte of Exam: 01/30/2021 Medical Rec #:  [id number]  Height:       66.0 in Accession #:    [id number]       Weight:       229.3 lb Date of Birth:  Sep 30, 1965        BSA:          2.119 m Patient Age:    80 years         BP:           102/57 mmHg Patient Gender: F                HR:           61 bpm. Exam Location:  Inpatient Procedure: 2D Echo, Cardiac Doppler and Color Doppler Indications:    TIA G45.9  History:        Patient has prior history of Echocardiogram examinations, most                 recent 12/14/2014. CHF; Risk Factors:Hypertension. Aortic                 dissection repair 5/16.  Sonographer:    Vickie Epley RDCS Referring Phys: Theola Sequin IMPRESSIONS  1. Left ventricular ejection fraction, by estimation, is 60 to 65%. The left ventricle has normal function. The left ventricle has no regional wall motion abnormalities. There is mild concentric left ventricular hypertrophy. Left ventricular diastolic parameters were normal.  2. Right ventricular systolic function is normal. The right ventricular size is normal. Tricuspid regurgitation signal is inadequate for assessing PA pressure.  3. The mitral valve is grossly normal. No evidence of mitral valve regurgitation. No evidence of mitral stenosis.  4. The aortic valve is tricuspid. Aortic valve regurgitation is not visualized. No aortic stenosis is present.  5. S/p ascending aortic repair. Normal measurements of the aorta are noted. Aortic root/ascending aorta has been repaired/replaced.  6. The inferior vena cava is normal in size with greater than 50% respiratory variability, suggesting right atrial pressure of 3 mmHg. Conclusion(s)/Recommendation(s): No intracardiac source of embolism detected on this transthoracic study. A transesophageal echocardiogram is recommended to exclude cardiac source of embolism if clinically indicated. FINDINGS  Left  Ventricle: Left ventricular ejection fraction, by estimation, is 60 to 65%. The left ventricle has normal function. The left ventricle has no regional wall motion abnormalities. The left ventricular internal cavity size was normal in size. There is  mild concentric left ventricular hypertrophy. Abnormal (paradoxical) septal motion consistent with post-operative status. Left ventricular diastolic parameters were normal. Right Ventricle: The right ventricular size is normal. No increase in right ventricular wall thickness. Right ventricular systolic function is normal. Tricuspid regurgitation signal is inadequate for assessing PA pressure. Left Atrium: Left atrial size was normal in size. Right Atrium: Right atrial size was normal in size. Pericardium: Trivial pericardial effusion is present. Mitral Valve: The mitral valve is grossly normal. Mild mitral annular calcification. No evidence of mitral valve regurgitation. No evidence of mitral valve stenosis. Tricuspid Valve: The tricuspid valve is grossly normal. Tricuspid valve regurgitation is trivial. No evidence of tricuspid stenosis. Aortic Valve: The aortic valve is tricuspid. Aortic valve regurgitation is not visualized. No aortic stenosis is present. Pulmonic Valve: The pulmonic valve was grossly normal. Pulmonic valve regurgitation is not visualized. No evidence of pulmonic stenosis. Aorta: S/p ascending aortic repair. Normal measurements of the aorta are noted. The aortic root/ascending aorta has been repaired/replaced. Venous: The inferior vena cava is normal in size with greater than 50% respiratory variability, suggesting  right atrial pressure of 3 mmHg. IAS/Shunts: The atrial septum is grossly normal.  LEFT VENTRICLE PLAX 2D LVIDd:         3.40 cm      Diastology LVIDs:         2.40 cm      LV e' medial:    7.83 cm/s LV PW:         1.20 cm      LV E/e' medial:  10.1 LV IVS:        1.30 cm      LV e' lateral:   9.90 cm/s LVOT diam:     2.10 cm      LV E/e'  lateral: 8.0 LV SV:         95 LV SV Index:   45 LVOT Area:     3.46 cm  LV Volumes (MOD) LV vol d, MOD A2C: 105.0 ml LV vol d, MOD A4C: 114.0 ml LV vol s, MOD A2C: 39.3 ml LV vol s, MOD A4C: 35.8 ml LV SV MOD A2C:     65.7 ml LV SV MOD A4C:     114.0 ml LV SV MOD BP:      74.0 ml RIGHT VENTRICLE RV S prime:     11.00 cm/s TAPSE (M-mode): 1.9 cm LEFT ATRIUM           Index       RIGHT ATRIUM           Index LA diam:      3.90 cm 1.84 cm/m  RA Area:     16.80 cm LA Vol (A2C): 51.4 ml 24.25 ml/m RA Volume:   39.10 ml  18.45 ml/m LA Vol (A4C): 57.0 ml 26.89 ml/m  AORTIC VALVE LVOT Vmax:   128.00 cm/s LVOT Vmean:  90.800 cm/s LVOT VTI:    0.274 m  AORTA Ao Root diam: 3.40 cm Ao Asc diam:  3.30 cm Ao Desc diam: 2.70 cm MITRAL VALVE MV Area (PHT): 2.58 cm    SHUNTS MV Decel Time: 294 msec    Systemic VTI:  0.27 m MV E velocity: 79.30 cm/s  Systemic Diam: 2.10 cm MV A velocity: 87.80 cm/s MV E/A ratio:  0.90 Eleonore Chiquito MD Electronically signed by Eleonore Chiquito MD Signature Date/Time: 01/30/2021/10:41:17 AM    Final    CT HEAD CODE STROKE WO CONTRAST  Result Date: 01/29/2021 CLINICAL DATA:  Code stroke. Aphasia. Right facial droop. Neuro deficit, acute, stroke suspected. EXAM: CT HEAD WITHOUT CONTRAST TECHNIQUE: Contiguous axial images were obtained from the base of the skull through the vertex without intravenous contrast. COMPARISON:  None. FINDINGS: Brain: Posterior right ICA scratched at posterior right cerebellar hypodensity noted. No other focal cortical hypoattenuation present. No significant white matter disease. The ventricles are of normal size. No significant extraaxial fluid collection is present. Brainstem and cerebellum are otherwise within normal limits. Vascular: No hyperdense vessel or unexpected calcification. Skull: Calvarium is intact. No focal lytic or blastic lesions are present. No significant extracranial soft tissue lesion is present. Sinuses/Orbits: The paranasal sinuses and mastoid  air cells are clear. The globes and orbits are within normal limits. ASPECTS [facility] Stroke Program Early CT Score) - Ganglionic level infarction (caudate, lentiform nuclei, internal capsule, insula, M1-M3 cortex): 7/7 - Supraganglionic infarction (M4-M6 cortex): 3/3 Total score (0-10 with 10 being normal): 10/10 IMPRESSION: 1. Posterior right cerebellar hypodensity consistent with age indeterminate nonhemorrhagic infarct. 2. ASPECTS is 10/10. The above was relayed via text pager  to Dr. Cheral Marker on 01/29/2021 at 13:27 . Electronically Signed   By: San Morelle M.D.   On: 01/29/2021 13:27   CT ANGIO HEAD NECK W WO CM (CODE STROKE)  Result Date: 01/29/2021 CLINICAL DATA:  Right facial droop. Aphasia. Code stroke. Neuro deficit, acute, stroke suspected. EXAM: CT ANGIOGRAPHY HEAD AND NECK CT PERFUSION BRAIN TECHNIQUE: Multidetector CT imaging of the head and neck was performed using the standard protocol during bolus administration of intravenous contrast. Multiplanar CT image reconstructions and MIPs were obtained to evaluate the vascular anatomy. Carotid stenosis measurements (when applicable) are obtained utilizing NASCET criteria, using the distal internal carotid diameter as the denominator. Multiphase CT imaging of the brain was performed following IV bolus contrast injection. Subsequent parametric perfusion maps were calculated using RAPID software. CONTRAST:  152m OMNIPAQUE IOHEXOL 350 MG/ML SOLN; 465mOMNIPAQUE IOHEXOL 350 MG/ML SOLN COMPARISON:  None. FINDINGS: CTA NECK FINDINGS Aortic arch: The left vertebral artery originates directly from the aortic arch. Mild atherosclerotic calcifications are present at the arch without significant stenosis of the great vessels. No aneurysm is present. Right carotid system: The right common carotid artery is within normal limits. Bifurcation is unremarkable. Minimal calcification is present at the bifurcation. Cervical right ICA is normal. Left carotid system:  The left common carotid artery is mildly tortuous without significant stenosis. Bifurcation is unremarkable. Cervical left ICA normal. Vertebral arteries: The right vertebral artery is the dominant vessel. Both vertebral arteries originate from the scratched at the right vertebral artery originates from the subclavian artery. There is no significant stenosis of either vertebral artery. Skeleton: Degenerative endplate changes are most evident C5-6 and C6-7. No focal lytic or blastic lesions are present. Patient is status post median sternotomy. Other neck: Soft tissues the neck are otherwise unremarkable. Salivary glands are within normal limits. Thyroid is normal. No significant adenopathy is present. No focal mucosal or submucosal lesions are present. Upper chest: Minimal dependent atelectasis is present in the lungs. Thoracic inlet is normal. Review of the MIP images confirms the above findings CTA HEAD FINDINGS Anterior circulation: Atherosclerotic changes are present within the cavernous internal carotid arteries bilaterally without significant stenosis through the ICA termini. The A1 and M1 segments are normal. The anterior communicating artery is patent. MCA bifurcations are within normal limits. ACA and MCA branch vessels are normal. Posterior circulation: The right vertebral artery is slightly dominant to the left. PICA origins are visualized and normal. Vertebrobasilar junction is normal. The basilar artery is normal. Both posterior cerebral arteries originate from the basilar tip. PCA branch vessels are within normal limits bilaterally. Venous sinuses: The dural sinuses are patent. The straight sinus deep cerebral veins are intact. Cortical veins are within normal limits. No significant vascular malformation is evident. Anatomic variants: None Review of the MIP images confirms the above findings CT Brain Perfusion Findings: ASPECTS: 10/10 CBF (<30%) Volume: 61m74merfusion (Tmax>6.0s) volume: 61mL57msmatch  Volume: 61mL 22mRESSION: 1. Normal variant CTA Circle of Willis without significant proximal stenosis, aneurysm, or branch vessel occlusion. 2. Minimal atherosclerotic changes at the right carotid bifurcation and cavernous internal carotid arteries bilaterally without significant stenosis. 3. Minimal degenerative changes in the cervical spine. 4. CT perfusion demonstrates no evidence for acute infarct or significant ischemia. The above was relayed via text pager to Dr. ERIC Kerney Elbe/27/2022 at 13:48 . Electronically Signed   By: ChrisSan Morelle   On: 01/29/2021 13:53     Labs:   Basic Metabolic Panel: Recent Labs  Lab 01/29/21  1312 01/29/21 1315 01/30/21 0143 01/30/21 0313  NA 135 138  --  137  K 3.3* 3.3*  --  3.7  CL 101 101  --  103  CO2 27  --   --  28  GLUCOSE 110* 108*  --  111*  BUN 9 12  --  13  CREATININE 0.90 0.80 0.88 0.94  CALCIUM 9.4  --   --  9.2   GFR Estimated Creatinine Clearance: 82.4 mL/min (by C-G formula based on SCr of 0.94 mg/dL). Liver Function Tests: Recent Labs  Lab 01/29/21 1312  AST 21  ALT 18  ALKPHOS 89  BILITOT 0.5  PROT 7.5  ALBUMIN 4.0   No results for input(s): LIPASE, AMYLASE in the last 168 hours. No results for input(s): AMMONIA in the last 168 hours. Coagulation profile Recent Labs  Lab 01/29/21 1312  INR 1.0    CBC: Recent Labs  Lab 01/29/21 1312 01/29/21 1315  WBC 6.0  --   NEUTROABS 2.8  --   HGB 14.2 15.0  HCT 43.1 44.0  MCV 94.9  --   PLT 289  --    Cardiac Enzymes: No results for input(s): CKTOTAL, CKMB, CKMBINDEX, TROPONINI in the last 168 hours. BNP: Invalid input(s): POCBNP CBG: Recent Labs  Lab 01/29/21 1303  GLUCAP 127*   D-Dimer No results for input(s): DDIMER in the last 72 hours. Hgb A1c No results for input(s): HGBA1C in the last 72 hours. Lipid Profile Recent Labs    01/30/21 0313  CHOL 162  HDL 42  LDLCALC 97  TRIG 115  CHOLHDL 3.9   Thyroid function studies No results  for input(s): TSH, T4TOTAL, T3FREE, THYROIDAB in the last 72 hours.  Invalid input(s): FREET3 Anemia work up No results for input(s): VITAMINB12, FOLATE, FERRITIN, TIBC, IRON, RETICCTPCT in the last 72 hours. Microbiology Recent Results (from the past 240 hour(s))  Resp Panel by RT-PCR (Flu A&B, Covid) Nasopharyngeal Swab     Status: None   Collection Time: 01/29/21  1:12 PM   Specimen: Nasopharyngeal Swab; Nasopharyngeal(NP) swabs in vial transport medium  Result Value Ref Range Status   SARS Coronavirus 2 by RT PCR NEGATIVE NEGATIVE Final    Comment: (NOTE) SARS-CoV-2 target nucleic acids are NOT DETECTED.  The SARS-CoV-2 RNA is generally detectable in upper respiratory specimens during the acute phase of infection. The lowest concentration of SARS-CoV-2 viral copies this assay can detect is 138 copies/mL. A negative result does not preclude SARS-Cov-2 infection and should not be used as the sole basis for treatment or other patient management decisions. A negative result may occur with  improper specimen collection/handling, submission of specimen other than nasopharyngeal swab, presence of viral mutation(s) within the areas targeted by this assay, and inadequate number of viral copies(<138 copies/mL). A negative result must be combined with clinical observations, patient history, and epidemiological information. The expected result is Negative.  Fact Sheet for Patients:  EntrepreneurPulse.com.au  Fact Sheet for Healthcare Providers:  IncredibleEmployment.be  This test is no t yet approved or cleared by the [place] FDA and  has been authorized for detection and/or diagnosis of SARS-CoV-2 by FDA under an Emergency Use Authorization (EUA). This EUA will remain  in effect (meaning this test can be used) for the duration of the COVID-19 declaration under Section 564(b)(1) of the Act, 21 U.S.C.section 360bbb-3(b)(1), unless the  authorization is terminated  or revoked sooner.       Influenza A by PCR NEGATIVE NEGATIVE Final  Influenza B by PCR NEGATIVE NEGATIVE Final    Comment: (NOTE) The Xpert Xpress SARS-CoV-2/FLU/RSV plus assay is intended as an aid in the diagnosis of influenza from Nasopharyngeal swab specimens and should not be used as a sole basis for treatment. Nasal washings and aspirates are unacceptable for Xpert Xpress SARS-CoV-2/FLU/RSV testing.  Fact Sheet for Patients: EntrepreneurPulse.com.au  Fact Sheet for Healthcare Providers: IncredibleEmployment.be  This test is not yet approved or cleared by the [place] FDA and has been authorized for detection and/or diagnosis of SARS-CoV-2 by FDA under an Emergency Use Authorization (EUA). This EUA will remain in effect (meaning this test can be used) for the duration of the COVID-19 declaration under Section 564(b)(1) of the Act, 21 U.S.C. section 360bbb-3(b)(1), unless the authorization is terminated or revoked.  Performed at [facility], [place] [address], [place], [place] [address]      Discharge Instructions:   Discharge Instructions     Ambulatory referral to Neurology   Complete by: As directed    An appointment is requested in approximately: 8 weeks   Diet - low sodium heart healthy   Complete by: As directed    Discharge instructions   Complete by: As directed    Per [facility] statutes, patients with seizures are not allowed to drive until they have been seizure-free for six months.    Use caution when using heavy equipment or power tools. Avoid working on ladders or at heights. Take showers instead of baths. Ensure the water temperature is not too high on the home water heater. Do not go swimming alone. Do not lock yourself in a room alone (i.e. bathroom). When caring for infants or small children, sit down when holding, feeding, or changing them to minimize risk of injury  to the child in the event you have a seizure. Maintain good sleep hygiene. Avoid alcohol.    If patient has another seizure, call 911 and bring them back to the ED if: A.  The seizure lasts longer than 5 minutes.      B.  The patient doesn't wake shortly after the seizure or has new problems such as difficulty seeing, speaking or moving following the seizure C.  The patient was injured during the seizure D.  The patient has a temperature over 102 F (39C) E.  The patient vomited during the seizure and now is having trouble breathing   Increase activity slowly   Complete by: As directed       Allergies as of 01/30/2021       Reactions   Influenza Vaccines    Can tolerate egg free vaccine         Medication List     STOP taking these medications    amLODipine 10 MG tablet Commonly known as: NORVASC   lisinopril 40 MG tablet Commonly known as: ZESTRIL       TAKE these medications    acetaminophen 500 MG tablet Commonly known as: TYLENOL Take 500 mg by mouth every 6 (six) hours as needed for mild pain.   aspirin EC 81 MG tablet Take 1 tablet (81 mg total) by mouth daily.   carvedilol 25 MG tablet Commonly known as: COREG Take 1 tablet (25 mg total) by mouth 2 (two) times daily with a meal.   furosemide 20 MG tablet Commonly known as: LASIX Take 1 tablet (20 mg total) by mouth as needed for edema.   hydrochlorothiazide 25 MG tablet Commonly known as: HYDRODIURIL Take  1 tablet (25 mg total) by mouth daily.   levETIRAcetam 500 MG tablet Commonly known as: KEPPRA Take 1 tablet (500 mg total) by mouth 2 (two) times daily.   potassium chloride 10 MEQ tablet Commonly known as: KLOR-CON Take 1 tablet (10 mEq total) by mouth daily.   simvastatin 20 MG tablet Commonly known as: ZOCOR Take 1 tablet (20 mg total) by mouth daily.        Follow-up Information     Nicolette Bang, MD Follow up in 1 week(s).   Specialty: Family Medicine Contact  information: [place] [address] [phone]         Sanda Klein, MD .   Specialty: Cardiology Contact information: [address] [place] [address] [phone]                  Time coordinating discharge: 25 min  Signed:  Geradine Girt DO  [facility] 01/30/2021, 2:47 PM

## 2021-01-30 NOTE — ED Notes (Signed)
Patient transported to MRI 

## 2021-01-30 NOTE — Progress Notes (Signed)
Occupational Therapy Progress Note:  Pt was screened for Occupational Therapy and now at baseline. Occupational Therap y signing off.  Joeseph Amor OTR/L  Acute Rehab Services  [phone] office number [phone] pager number

## 2021-01-30 NOTE — ED Notes (Signed)
ECHO at bedside.

## 2021-01-30 NOTE — Progress Notes (Addendum)
Await MRI With contrast-- if no abnormalities will d/c on keppra 500 mg BID per neurology JV

## 2021-01-30 NOTE — Progress Notes (Signed)
Subjective: No further seizures overnight after starting Keppra.  Patient denies any prior history of seizures/epilepsy.  Denies any seizure risk factors (normal delivery denies febrile seizures, denies developmental delay, denies family history of epilepsy, denies neurosurgical procedures, brain injury )  ROS: negative except above  Examination  Vital signs in last 24 hours: Temp:  [98.1 F (36.7 C)-99.2 F (37.3 C)] 99.2 F (37.3 C) (07/28 0426) Pulse Rate:  [52-76] 57 (07/28 1230) Resp:  [13-27] 17 (07/28 1230) BP: (102-150)/(52-84) 119/65 (07/28 1230) SpO2:  [91 %-100 %] 95 % (07/28 1230)  General: lying in bed, NAD CVS: pulse-normal rate and rhythm RS: breathing comfortably, CTAB Extremities: normal, warm   Neuro: MS: Alert, oriented, follows commands CN: pupils equal and reactive,  EOMI, face symmetric, tongue midline, normal sensation over face Motor: 5/5 strength in all 4 extremities   Basic Metabolic Panel: Recent Labs  Lab 01/29/21 1312 01/29/21 1315 01/30/21 0143 01/30/21 0313  NA 135 138  --  137  K 3.3* 3.3*  --  3.7  CL 101 101  --  103  CO2 27  --   --  28  GLUCOSE 110* 108*  --  111*  BUN 9 12  --  13  CREATININE 0.90 0.80 0.88 0.94  CALCIUM 9.4  --   --  9.2    CBC: Recent Labs  Lab 01/29/21 1312 01/29/21 1315  WBC 6.0  --   NEUTROABS 2.8  --   HGB 14.2 15.0  HCT 43.1 44.0  MCV 94.9  --   PLT 289  --      Coagulation Studies: Recent Labs    01/29/21 1312  LABPROT 12.9  INR 1.0    Imaging MRI brain without contrast 01/29/2021: No acute abnormality.   ASSESSMENT AND PLAN: 55 year old female with episodes of transient speech difficulty.  EEG showed left frontotemporal spikes.  New onset focal epilepsy, left frontotemporal region -EEG showed intermittent slowing in left frontotemporal region as well as spikes in left frontotemporal region  Recommendation -Continue Keppra 500 mg twice daily -We will obtain MRI with contrast to  look for any acute abnormality -If no further seizures and MRI brain does not show any acute abnormality, patient is stable for discharge from neurology standpoint -Discussed seizure risk factors, side effect of Keppra with patient -Discussed seizure precautions including do not drive -We will need follow-up with neurology in 10 to 12 weeks  Seizure precautions: Per [facility] statutes, patients with seizures are not allowed to drive until they have been seizure-free for six months and cleared by a physician    Use caution when using heavy equipment or power tools. Avoid working on ladders or at heights. Take showers instead of baths. Ensure the water temperature is not too high on the home water heater. Do not go swimming alone. Do not lock yourself in a room alone (i.e. bathroom). When caring for infants or small children, sit down when holding, feeding, or changing them to minimize risk of injury to the child in the event you have a seizure. Maintain good sleep hygiene. Avoid alcohol.    If patient has another seizure, call 911 and bring them back to the ED if: A.  The seizure lasts longer than 5 minutes.      B.  The patient doesn't wake shortly after the seizure or has new problems such as difficulty seeing, speaking or moving following the seizure C.  The patient was injured during the seizure  D.  The patient has a temperature over 102 F (39C) E.  The patient vomited during the seizure and now is having trouble breathing    During the Seizure   - First, ensure adequate ventilation and place patients on the floor on their left side  Loosen clothing around the neck and ensure the airway is patent. If the patient is clenching the teeth, do not force the mouth open with any object as this can cause severe damage - Remove all items from the surrounding that can be hazardous. The patient may be oblivious to what's happening and may not even know what he or she is doing. If the patient  is confused and wandering, either gently guide him/her away and block access to outside areas - Reassure the individual and be comforting - Call 911. In most cases, the seizure ends before EMS arrives. However, there are cases when seizures may last over 3 to 5 minutes. Or the individual may have developed breathing difficulties or severe injuries. If a pregnant patient or a person with diabetes develops a seizure, it is prudent to call an ambulance. - Finally, if the patient does not regain full consciousness, then call EMS. Most patients will remain confused for about 45 to 90 minutes after a seizure, so you must use judgment in calling for help. - Avoid restraints but make sure the patient is in a bed with padded side rails - Place the individual in a lateral position with the neck slightly flexed; this will help the saliva drain from the mouth and prevent the tongue from falling backward - Remove all nearby furniture and other hazards from the area - Provide verbal assurance as the individual is regaining consciousness - Provide the patient with privacy if possible - Call for help and start treatment as ordered by the caregiver    After the Seizure (Postictal Stage)   After a seizure, most patients experience confusion, fatigue, muscle pain and/or a headache. Thus, one should permit the individual to sleep. For the next few days, reassurance is essential. Being calm and helping reorient the person is also of importance.   Most seizures are painless and end spontaneously. Seizures are not harmful to others but can lead to complications such as stress on the lungs, brain and the heart. Individuals with prior lung problems may develop labored breathing and respiratory distress.   I have spent a total of  35 minutes with the patient reviewing hospital notes,  test results, labs and examining the patient as well as establishing an assessment and plan that was discussed personally with the patient.  >  50% of time was spent in direct patient care.       Zeb Comfort Epilepsy [facility] For questions after 5pm please refer to AMION to reach the Neurologist on call

## 2021-01-30 NOTE — Progress Notes (Signed)
  Echocardiogram 2D Echocardiogram has been performed.  Michiel Cowboy 01/30/2021, 8:52 AM

## 2021-01-30 NOTE — Evaluation (Signed)
Physical Therapy Evaluation and Discharge Patient Details Name: Ashley Guerrero MRN: [id number] DOB: August 08, 1965 Today's Date: 01/30/2021   History of Present Illness  Pt is a 55 y/o female admitted on 7/27 secondary to facial droop and expressive difficulty. MRI negative. EEG showed cortical dysfunction in L frontotemporal region. PMH includes HTN, dHF, and aortic dissection s/p repair.  Clinical Impression  Patient evaluated by Physical Therapy with no further acute PT needs identified. All education has been completed and the patient has no further questions. Pt overall steady with mobility tasks and at an independent level. Able to perform DGI tasks without LOB. Pt reports she has a neighbor that can check on her if needed. Educated about "BE FAST" acronym in recognizing CVA symptoms. See below for any follow-up Physical Therapy or equipment needs. PT is signing off. Thank you for this referral. If needs change, please re-consult.      Follow Up Recommendations No PT follow up    Equipment Recommendations  None recommended by PT    Recommendations for Other Services       Precautions / Restrictions Precautions Precautions: None Restrictions Weight Bearing Restrictions: No      Mobility  Bed Mobility Overal bed mobility: Independent                  Transfers Overall transfer level: Independent                  Ambulation/Gait Ambulation/Gait assistance: Independent Gait Distance (Feet): 175 Feet Assistive device: None Gait Pattern/deviations: WFL(Within Functional Limits) Gait velocity: WFL   General Gait Details: Able to perform dynamic gait tasks without LOB noted.  Stairs            Wheelchair Mobility    Modified Rankin (Stroke Patients Only)       Balance Overall balance assessment: Independent                               Standardized Balance Assessment Standardized Balance Assessment : Dynamic Gait Index    Dynamic Gait Index Level Surface: Normal Change in Gait Speed: Normal Gait with Horizontal Head Turns: Normal Gait with Vertical Head Turns: Normal Gait and Pivot Turn: Normal Step Over Obstacle: Normal Step Around Obstacles: Normal       Pertinent Vitals/Pain Pain Assessment: No/denies pain    Home Living Family/patient expects to be discharged to:: Private residence Living Arrangements: Alone Available Help at Discharge: Neighbor;Available PRN/intermittently Type of Home: House Home Access: Stairs to enter Entrance Stairs-Rails: Right Entrance Stairs-Number of Steps: 3 Home Layout: One level Home Equipment: None      Prior Function Level of Independence: Independent         Comments: Works at the front desk at [employer]        Extremity/Trunk Assessment   Upper Extremity Assessment Upper Extremity Assessment: Overall WFL for tasks assessed    Lower Extremity Assessment Lower Extremity Assessment: Overall WFL for tasks assessed    Cervical / Trunk Assessment Cervical / Trunk Assessment: Normal  Communication   Communication: No difficulties  Cognition Arousal/Alertness: Awake/alert Behavior During Therapy: WFL for tasks assessed/performed Overall Cognitive Status: Within Functional Limits for tasks assessed  General Comments General comments (skin integrity, edema, etc.): Educated about "BE FAST" when recognizing CVA symptoms.    Exercises     Assessment/Plan    PT Assessment Patent does not need any further PT services  PT Problem List         PT Treatment Interventions      PT Goals (Current goals can be found in the Care Plan section)  Acute Rehab PT Goals Patient Stated Goal: to go home PT Goal Formulation: With patient Time For Goal Achievement: 01/30/21 Potential to Achieve Goals: Good    Frequency     Barriers to discharge        Co-evaluation                AM-PAC PT "6 Clicks" Mobility  Outcome Measure Help needed turning from your back to your side while in a flat bed without using bedrails?: None Help needed moving from lying on your back to sitting on the side of a flat bed without using bedrails?: None Help needed moving to and from a bed to a chair (including a wheelchair)?: None Help needed standing up from a chair using your arms (e.g., wheelchair or bedside chair)?: None Help needed to walk in hospital room?: None Help needed climbing 3-5 steps with a railing? : None 6 Click Score: 24    End of Session Equipment Utilized During Treatment: Gait belt Activity Tolerance: Patient tolerated treatment well Patient left: in bed;with call bell/phone within reach (on stretcher in ED) Nurse Communication: Mobility status PT Visit Diagnosis: Other symptoms and signs involving the nervous system [id number])    [id number] PT Time Calculation (min) (ACUTE ONLY): 17 min   Charges:   PT Evaluation $PT Eval Low Complexity: 1 Low          Lou Miner, DPT  Acute Rehabilitation Services  Pager: [phone] Office: [phone]   Rudean Hitt 01/30/2021, 9:39 AM

## 2021-01-31 LAB — HEMOGLOBIN A1C
Hgb A1c MFr Bld: 5.9 % — ABNORMAL HIGH (ref 4.8–5.6)
Mean Plasma Glucose: 123 mg/dL

## 2021-03-17 ENCOUNTER — Emergency Department (HOSPITAL_COMMUNITY)
Admission: EM | Admit: 2021-03-17 | Discharge: 2021-03-18 | Disposition: A | Discharge status: Home / Self Care | Payer: 59 | Attending: Emergency Medicine | Admitting: Emergency Medicine

## 2021-03-17 ENCOUNTER — Encounter (HOSPITAL_COMMUNITY): Payer: Self-pay | Admitting: Emergency Medicine

## 2021-03-17 ENCOUNTER — Emergency Department (HOSPITAL_COMMUNITY): Payer: 59

## 2021-03-17 ENCOUNTER — Other Ambulatory Visit: Payer: Self-pay

## 2021-03-17 DIAGNOSIS — I5032 Chronic diastolic (congestive) heart failure: Secondary | ICD-10-CM | POA: Insufficient documentation

## 2021-03-17 DIAGNOSIS — Z7982 Long term (current) use of aspirin: Secondary | ICD-10-CM | POA: Diagnosis not present

## 2021-03-17 DIAGNOSIS — I110 Hypertensive heart disease with heart failure: Secondary | ICD-10-CM | POA: Diagnosis not present

## 2021-03-17 DIAGNOSIS — F17210 Nicotine dependence, cigarettes, uncomplicated: Secondary | ICD-10-CM | POA: Diagnosis not present

## 2021-03-17 DIAGNOSIS — Z20822 Contact with and (suspected) exposure to covid-19: Secondary | ICD-10-CM | POA: Diagnosis not present

## 2021-03-17 DIAGNOSIS — R569 Unspecified convulsions: Secondary | ICD-10-CM | POA: Diagnosis not present

## 2021-03-17 DIAGNOSIS — Z79899 Other long term (current) drug therapy: Secondary | ICD-10-CM | POA: Insufficient documentation

## 2021-03-17 LAB — URINALYSIS, MICROSCOPIC (REFLEX)

## 2021-03-17 LAB — AMMONIA: Ammonia: 19 umol/L (ref 9–35)

## 2021-03-17 LAB — URINALYSIS, ROUTINE W REFLEX MICROSCOPIC
Bilirubin Urine: NEGATIVE
Glucose, UA: NEGATIVE mg/dL
Ketones, ur: NEGATIVE mg/dL
Leukocytes,Ua: NEGATIVE
Nitrite: NEGATIVE
Protein, ur: 30 mg/dL — AB
Specific Gravity, Urine: 1.011 (ref 1.005–1.030)
pH: 8 (ref 5.0–8.0)

## 2021-03-17 LAB — CBG MONITORING, ED: Glucose-Capillary: 113 mg/dL — ABNORMAL HIGH (ref 70–99)

## 2021-03-17 LAB — RAPID URINE DRUG SCREEN, HOSP PERFORMED
Amphetamines: NOT DETECTED
Barbiturates: NOT DETECTED
Benzodiazepines: POSITIVE — AB
Cocaine: NOT DETECTED
Opiates: NOT DETECTED
Tetrahydrocannabinol: POSITIVE — AB

## 2021-03-17 MED ORDER — SODIUM CHLORIDE 0.9 % IV SOLN
2000.0000 mg | Freq: Once | INTRAVENOUS | Status: AC
  Administered 2021-03-17: 2000 mg via INTRAVENOUS
  Filled 2021-03-17: qty 20

## 2021-03-17 MED ORDER — LORAZEPAM 2 MG/ML IJ SOLN
1.0000 mg | Freq: Once | INTRAMUSCULAR | Status: AC
  Administered 2021-03-17: 1 mg via INTRAVENOUS
  Filled 2021-03-17: qty 1

## 2021-03-17 MED ORDER — LACTATED RINGERS IV BOLUS
1000.0000 mL | Freq: Once | INTRAVENOUS | Status: AC
  Administered 2021-03-17: 1000 mL via INTRAVENOUS

## 2021-03-17 MED ORDER — ACETAMINOPHEN 500 MG PO TABS
1000.0000 mg | ORAL_TABLET | Freq: Once | ORAL | Status: AC
  Administered 2021-03-18: 1000 mg via ORAL
  Filled 2021-03-17: qty 2

## 2021-03-17 MED ORDER — ONDANSETRON HCL 4 MG/2ML IJ SOLN
4.0000 mg | Freq: Once | INTRAMUSCULAR | Status: AC
  Administered 2021-03-17: 4 mg via INTRAVENOUS
  Filled 2021-03-17: qty 2

## 2021-03-17 NOTE — ED Provider Notes (Signed)
[facility] Provider Note   CSN: [id number] Arrival date & time: 03/17/21  1738     History Chief Complaint  Patient presents with   Seizures    Ashley Guerrero is a 55 y.o. female with PMHx HTN, CHF, and epilepsy who presents for evaluation of seizure-like activity.  HPI is limited due to altered mental status.  At approximately 4 PM today, patient was reportedly on the phone with a friend when she became confused and then stopped talking on the phone.  EMS was called, who found the patient confused, mumbling incoherently, but moving all extremities.  Reportedly, the patient has not been taking her seizure medication, stating that she has been feeling well.  While in route, patient was noted to have seizure-like activity by EMS, and she was subsequently given 2.5 mg of midazolam.  She was transported to our emergency department for further evaluation.  Of note, patient was discharged following recent hospital admission from 7/27 through 7/20, where she was admitted for evaluation of TIA and seizures.  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  MRI brain was normal.  Patient was discharged on Keppra 500 twice daily with plan for outpatient neurology follow-up.     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/04/2014   Essential hypertension, malignant 11/04/2014   Large  Abdominal mass- question uterine origin  11/04/2014    Past Surgical History:  Procedure Laterality Date   REPLACEMENT ASCENDING AORTA N/A  11/03/2014   Procedure: REPLACEMENT ASCENDING AORTA with a 52m hemashield graft,  resuspension of aortic valve, hypothermic circulatory arrest and cardiopulmonary bypass.;  Surgeon: EGrace Isaac MD;  [place] [facility]  Service: Open Heart Surgery;  Laterality: N/A;     OB History     Gravida  1   Para      Term      Preterm      AB  1   Living         SAB  1   IAB      Ectopic      Multiple      Live Births  0           Family History  Problem Relation Age of Onset   Hypertension Mother    Breast cancer Mother    Brain cancer Mother    Stroke Father    Heart failure Father    Hypertension Father    Diabetes Father    Hyperlipidemia Father    Hypertension Sister    Hypertension Brother    Cancer Sister    Breast cancer Maternal Aunt    Breast cancer Paternal Aunt    Breast cancer Maternal Grandmother    Breast cancer Paternal Grandmother    Heart attack Neg Hx     Social History   Tobacco Use   Smoking status: Some Days    Packs/day: 1.00    Types: Cigarettes    Start date: 11/03/2014   Smokeless tobacco:  Never  Vaping Use   Vaping Use: Never used  Substance Use Topics   Alcohol use: Not Currently    Alcohol/week: 0.0 standard drinks    Comment: occasional   Drug use: Not Currently    Types: Marijuana    Home Medications Prior to Admission medications   Medication Sig Start Date End Date Taking? Authorizing Provider  acetaminophen (TYLENOL) 500 MG tablet Take 500 mg by mouth every 6 (six) hours as needed for mild pain.    [provider]  aspirin EC 81 MG tablet Take 1 tablet (81 mg total) by mouth daily. 06/13/19   Croitoru, Mihai, MD  carvedilol (COREG) 25 MG tablet Take 1 tablet (25 mg total) by mouth 2 (two) times daily with a meal. 08/29/20   Nicolette Bang, MD  furosemide (LASIX) 20 MG tablet Take 1 tablet (20 mg total) by mouth as needed for edema. 11/08/20   Croitoru, Mihai, MD  hydrochlorothiazide  (HYDRODIURIL) 25 MG tablet Take 1 tablet (25 mg total) by mouth daily. 12/10/20   Nicolette Bang, MD  levETIRAcetam (KEPPRA) 500 MG tablet Take 1 tablet (500 mg total) by mouth 2 (two) times daily. 01/30/21   Geradine Girt, DO  potassium chloride (KLOR-CON) 10 MEQ tablet Take 1 tablet (10 mEq total) by mouth daily. 08/29/20   Nicolette Bang, MD  simvastatin (ZOCOR) 20 MG tablet Take 1 tablet (20 mg total) by mouth daily. 08/29/20   Nicolette Bang, MD  amLODipine (NORVASC) 10 MG tablet Take 1 tablet (10 mg total) by mouth daily. 08/29/20 01/30/21  Nicolette Bang, MD  lisinopril (ZESTRIL) 40 MG tablet Take 1 tablet (40 mg total) by mouth daily. 08/29/20 01/30/21  Nicolette Bang, MD    Allergies    Influenza vaccines  Review of Systems   Review of Systems  Unable to perform ROS: Mental status change  Constitutional:  Negative for chills and fever.  HENT:  Negative for ear pain and sore throat.   Eyes:  Negative for pain and visual disturbance.  Respiratory:  Negative for cough and shortness of breath.   Cardiovascular:  Negative for chest pain and palpitations.  Gastrointestinal:  Negative for abdominal pain and vomiting.  Genitourinary:  Negative for dysuria and hematuria.  Musculoskeletal:  Negative for arthralgias and back pain.  Skin:  Negative for color change and rash.  Neurological:  Positive for seizures. Negative for syncope.  Psychiatric/Behavioral:  Positive for confusion.   All other systems reviewed and are negative.  Physical Exam Updated Vital Signs BP 122/61   Pulse 65   Temp 99 F (37.2 C) (Oral)   Resp 16   Ht '5\' 6"'$  (1.676 m)   Wt 104 kg   LMP 10/29/2014   SpO2 95%   BMI 37.01 kg/m   Physical Exam Vitals and nursing note reviewed.  Constitutional:      General: She is not in acute distress.    Appearance: She is well-developed.  HENT:     Head: Normocephalic and atraumatic.  Eyes:     Conjunctiva/sclera:  Conjunctivae normal.  Cardiovascular:     Rate and Rhythm: Normal rate and regular rhythm.     Heart sounds: No murmur heard. Pulmonary:     Effort: Pulmonary effort is normal. No respiratory distress.     Breath sounds: Normal breath sounds.  Abdominal:     Palpations: Abdomen is soft.     Tenderness: There is no abdominal tenderness.  Musculoskeletal:  Cervical back: Neck supple.  Skin:    General: Skin is warm and dry.  Neurological:     Mental Status: She is confused.     GCS: GCS eye subscore is 4. GCS verbal subscore is 2. GCS motor subscore is 6.     Cranial Nerves: Cranial nerves are intact. No cranial nerve deficit.     Sensory: Sensation is intact. No sensory deficit.     Motor: Motor function is intact. No weakness.     Coordination: Coordination is intact.    ED Results / Procedures / Treatments   Labs (all labs ordered are listed, but only abnormal results are displayed) Labs Reviewed  COMPREHENSIVE METABOLIC PANEL - Abnormal; Notable for the following components:      Result Value   Glucose, Bld 115 (*)    All other components within normal limits  URINALYSIS, ROUTINE W REFLEX MICROSCOPIC - Abnormal; Notable for the following components:   APPearance CLOUDY (*)    Hgb urine dipstick MODERATE (*)    Protein, ur 30 (*)    All other components within normal limits  RAPID URINE DRUG SCREEN, HOSP PERFORMED - Abnormal; Notable for the following components:   Benzodiazepines POSITIVE (*)    Tetrahydrocannabinol POSITIVE (*)    All other components within normal limits  URINALYSIS, MICROSCOPIC (REFLEX) - Abnormal; Notable for the following components:   Bacteria, UA RARE (*)    All other components within normal limits  CBG MONITORING, ED - Abnormal; Notable for the following components:   Glucose-Capillary 113 (*)    All other components within normal limits  RESP PANEL BY RT-PCR (FLU A&B, COVID) ARPGX2  CBC WITH DIFFERENTIAL/PLATELET  AMMONIA   EKG EKG  Interpretation  Date/Time:  Monday March 17 2021 18:14:28 EDT Ventricular Rate:  74 PR Interval:  187 QRS Duration: 93 QT Interval:  518 QTC Calculation: 575 R Axis:   91 Text Interpretation: Sinus rhythm Borderline right axis deviation Anteroseptal infarct, old Borderline repolarization abnormality Prolonged QT interval Confirmed by Ripley Fraise [phone]) on 03/17/2021 11:23:35 PM Also confirmed by Ripley Fraise [phone]), editor Lamar Benes, Ashwini ([id number])  on 03/18/2021 8:17:05 AM  Radiology CT HEAD WO CONTRAST (5MM)  Result Date: 03/18/2021 CLINICAL DATA:  Seizure, altered mental status EXAM: CT HEAD WITHOUT CONTRAST TECHNIQUE: Contiguous axial images were obtained from the base of the skull through the vertex without intravenous contrast. COMPARISON:  MRI brain dated 01/30/2021.  CT head dated 01/29/2021. FINDINGS: Motion degraded images. Brain: No evidence of acute infarction, hemorrhage, hydrocephalus, extra-axial collection or mass lesion/mass effect. 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 Mild small vessel ischemic changes. Empty sella. Vascular: No hyperdense vessel or unexpected calcification. Skull: Normal. Negative for fracture or focal lesion. Sinuses/Orbits: The visualized paranasal sinuses are essentially clear. The mastoid air cells are unopacified. Other: None. IMPRESSION: Motion degraded images. No evidence of acute intracranial abnormality. Mild small vessel ischemic changes. Old right cerebellar infarct. Electronically Signed   By: Julian Hy M.D.   On: 03/18/2021 01:06   DG Chest Port 1 View  Result Date: 03/18/2021 CLINICAL DATA:  Seizure. EXAM: PORTABLE CHEST 1 VIEW COMPARISON:  Nov 19, 2014 FINDINGS: Multiple sternal wires are present. There is no evidence of acute infiltrate, pleural effusion or pneumothorax. Stable prominence of the left perihilar pulmonary vasculature is noted. The heart size and mediastinal contours are within normal limits.  Stable tortuosity of the descending thoracic aorta is seen. The visualized skeletal structures are unremarkable. IMPRESSION: 1. Evidence of prior median  sternotomy. 2. No acute or active cardiopulmonary disease. Electronically Signed   By: Virgina Norfolk M.D.   On: 03/18/2021 02:52    Procedures Procedures   Medications Ordered in ED Medications  lactated ringers bolus 1,000 mL (0 mLs Intravenous Stopped 03/17/21 2028)  levETIRAcetam (KEPPRA) 2,000 mg in sodium chloride 0.9 % 250 mL IVPB (0 mg Intravenous Stopped 03/17/21 2028)  LORazepam (ATIVAN) injection 1 mg (1 mg Intravenous Given 03/17/21 2112)  acetaminophen (TYLENOL) tablet 1,000 mg (1,000 mg Oral Given 03/18/21 0155)  ondansetron (ZOFRAN) injection 4 mg (4 mg Intravenous Given 03/17/21 2334)    ED Course  I have reviewed the triage vital signs and the nursing notes.  Pertinent labs & imaging results that were available during my care of the patient were reviewed by me and considered in my medical decision making (see chart for details).    MDM Rules/Calculators/A&P                           55 y.o. female with past medical history as above who presents for evaluation of seizure-like activity. Afebrile and hemodynamically stable.  Exam as detailed above. Presentation most concerning for provoked seizure in the setting of AED non-adherence. Labs notable for normoglycemia as well as THC and Benzodiazepines, consistent with Versed given by EMS. EKG is NSR. She remains post-ictal on serial reassessments. Patient continues to state her birthday rather than name when asked, as well as other inappropriate responses to questioning. On chart review, this is identical to her prior presentation for seizure. Given prolonged postictal state, additional labs and CT head was ordered. Additional labs unremarkable. CT Head without evidence of acute intracranial abnormality. Case discussed with Neurologist on-call, who states that prolonged post-ictal  state is expected due to focal epilepsy with generalization today. Patient will require further observation at the time of my sign-out.   Final Clinical Impression(s) / ED Diagnoses Final diagnoses:  Seizure ([place])    Rx / DC Orders ED Discharge Orders          Ordered    Ambulatory referral to Neurology       Comments: An appointment is requested in approximately: 1 week   03/18/21 0307             Violet Baldy, MD 03/19/21 [id number]    Elnora Morrison, MD 03/19/21 [phone]

## 2021-03-17 NOTE — ED Triage Notes (Signed)
Pt BIB [facility] from home c/o seizure. At 1600 today pt was on the phone with her friend when she became altered and went unresponsive. Friend called 911. On EMS arrival, pt unable to answer questions, would only mumble. Pt had witnessed 1 minute seizure en route. Given 2.5 mg midazolam by EMS. Pt newly diagnosed with seizures, refused to take seizure medications.

## 2021-03-17 NOTE — ED Notes (Signed)
Pt ambulated but had to have assistance to the bathroom due to pt being very unstable

## 2021-03-17 NOTE — ED Notes (Signed)
Pt c/o of having to urinate. Purewick placed. Pt refused to pee with purewick. Pt experiencing Word salad with numbers for questions asked aside from Name. Pt keeps trying to get out the bed. Pt redirected multiple times about external catheter but still tried to get out of bed. Ambulated with Pt to restroom. Pt urinated and helped back to bed.

## 2021-03-18 ENCOUNTER — Emergency Department (HOSPITAL_COMMUNITY): Payer: 59

## 2021-03-18 LAB — COMPREHENSIVE METABOLIC PANEL
ALT: 17 U/L (ref 0–44)
AST: 18 U/L (ref 15–41)
Albumin: 3.6 g/dL (ref 3.5–5.0)
Alkaline Phosphatase: 88 U/L (ref 38–126)
Anion gap: 8 (ref 5–15)
BUN: 10 mg/dL (ref 6–20)
CO2: 28 mmol/L (ref 22–32)
Calcium: 9 mg/dL (ref 8.9–10.3)
Chloride: 99 mmol/L (ref 98–111)
Creatinine, Ser: 0.85 mg/dL (ref 0.44–1.00)
GFR, Estimated: >60 mL/min (ref >60)
Glucose, Bld: 115 mg/dL — ABNORMAL HIGH (ref 70–99)
Potassium: 3.5 mmol/L (ref 3.5–5.1)
Sodium: 135 mmol/L (ref 135–145)
Total Bilirubin: 0.7 mg/dL (ref 0.3–1.2)
Total Protein: 7 g/dL (ref 6.5–8.1)

## 2021-03-18 LAB — CBC WITH DIFFERENTIAL/PLATELET
Abs Immature Granulocytes: 0.02 10*3/uL (ref 0.00–0.07)
Basophils Absolute: 0 10*3/uL (ref 0.0–0.1)
Basophils Relative: 0 %
Eosinophils Absolute: 0.1 10*3/uL (ref 0.0–0.5)
Eosinophils Relative: 1 %
HCT: 41.6 % (ref 36.0–46.0)
Hemoglobin: 13.7 g/dL (ref 12.0–15.0)
Immature Granulocytes: 0 %
Lymphocytes Relative: 23 %
Lymphs Abs: 1.6 10*3/uL (ref 0.7–4.0)
MCH: 31.4 pg (ref 26.0–34.0)
MCHC: 32.9 g/dL (ref 30.0–36.0)
MCV: 95.2 fL (ref 80.0–100.0)
Monocytes Absolute: 0.3 10*3/uL (ref 0.1–1.0)
Monocytes Relative: 4 %
Neutro Abs: 4.8 10*3/uL (ref 1.7–7.7)
Neutrophils Relative %: 72 %
Platelets: 287 10*3/uL (ref 150–400)
RBC: 4.37 MIL/uL (ref 3.87–5.11)
RDW: 14 % (ref 11.5–15.5)
WBC: 6.8 10*3/uL (ref 4.0–10.5)
nRBC: 0 % (ref 0.0–0.2)

## 2021-03-18 LAB — RESP PANEL BY RT-PCR (FLU A&B, COVID) ARPGX2
Influenza A by PCR: NEGATIVE
Influenza B by PCR: NEGATIVE
SARS Coronavirus 2 by RT PCR: NEGATIVE

## 2021-03-18 NOTE — Discharge Instructions (Addendum)
Please be aware you may have another seizure ° °Do not drive until seen by your physician for your condition ° °Do not climb ladders/roofs/trees as a seizure can occur at that height and cause serious harm ° °Do not bathe/swim alone as a seizure can occur and cause serious harm ° °Please followup with your physician or neurologist for further testing and possible treatment ° ° °

## 2021-03-18 NOTE — ED Provider Notes (Signed)
I assumed care in signout to follow-up on imaging and labs. Patient had a prolonged postictal episode. Patient now awake and alert in no acute distress.  She does not recall the details of the seizure Is awake alert, moves all extremities x4.  She is now found to be febrile but no obvious source of fever Urinalysis negative.  Chest x-ray was negative.  COVID test pending   EKG Interpretation  Date/Time:  Monday March 17 2021 18:14:28 EDT Ventricular Rate:  74 PR Interval:  187 QRS Duration: 93 QT Interval:  518 QTC Calculation: 575 R Axis:   91 Text Interpretation: Sinus rhythm Borderline right axis deviation Anteroseptal infarct, old Borderline repolarization abnormality Prolonged QT interval Confirmed by Ripley Fraise [phone]) on 03/17/2021 11:23:35 PM       Once COVID test results, the patient continues to improve, she will be discharged home. Advise no driving until seen by neurology. I also spoke to family member Salena via phone.  She will pick up later in the morning   Ripley Fraise, MD 03/18/21 [phone]

## 2021-03-18 NOTE — ED Notes (Signed)
Daughter Ashley Guerrero [phone].

## 2021-03-18 NOTE — ED Notes (Signed)
Pt A&O x3 at this time. Reoriented to episodes leading up to hospitalization. Pt calm and cooperative with staff. MD notified

## 2021-03-18 NOTE — ED Provider Notes (Signed)
Pt improved CXR negative COVID negative She is resting comfortably She does report recent vomiting/diarrhea which might explain fever No meningeal signs She is safe for d/c home  Given neuro referral    Ripley Fraise, MD 03/18/21 0500

## 2021-03-19 ENCOUNTER — Encounter: Payer: Self-pay | Admitting: Neurology

## 2021-03-20 ENCOUNTER — Telehealth: Payer: Self-pay | Admitting: Cardiovascular Disease

## 2021-03-20 NOTE — Telephone Encounter (Signed)
Contacted pt regarding the below message. Pt state she recently just read the side effects of HCTZ and wondering if that could be contributing to her seizures. She state she has never had this issue before and hydrochlorothiazide is the only thing that's new.     Patient states she has had to go the hospital twice for seizures. She states the first time it happened 7/28 and then again last Tuesday. Patient states she has also been feeling a little dizziness and noticed her vision was blurry at times. She states she unfortunately did not pay it any attention until she passed out.  She says the first time it happened at work. She says she works at a [employer] and could not talk. She states she then went into a catatonic state and was rushed to the ED and was diagnosed with epilepsy. She says she is not sure if the medication is causing the seizures, but she was recently put on the medication.         Will forward to pharm D and MD for recommendations.

## 2021-03-20 NOTE — Telephone Encounter (Signed)
Pt c/o medication issue:  1. Name of Medication:  hydrochlorothiazide (HYDRODIURIL) 25 MG tablet  2. How are you currently taking this medication (dosage and times per day)? 1 tablet daily  3. Are you having a reaction (difficulty breathing--STAT)? no  4. What is your medication issue? Patient states she has had to go the hospital twice for seizures. She states the first time it happened 7/28 and then again last Tuesday. Patient states she has also been feeling a little dizziness and noticed her vision was blurry at times. She states she unfortunately did not pay it any attention until she passed out.  She says the first time it happened at work. She says she works at a [employer] and could not talk. She states she then went into a catatonic state and was rushed to the ED and was diagnosed with epilepsy. She says she is not sure if the medication is causing the seizures, but she was recently put on the medication.

## 2021-03-21 NOTE — Telephone Encounter (Signed)
Pt updated and verbalized understanding.  

## 2021-03-21 NOTE — Telephone Encounter (Signed)
HCTZ does not cause seizures. I suppose if her K was very this could contribute, but her K was normal on both days she had seizures. Extremely unlikely that HCTZ contributed to her seizures.

## 2021-04-04 ENCOUNTER — Other Ambulatory Visit: Payer: Self-pay

## 2021-04-04 ENCOUNTER — Encounter: Payer: Self-pay | Admitting: Neurology

## 2021-04-04 ENCOUNTER — Ambulatory Visit (INDEPENDENT_AMBULATORY_CARE_PROVIDER_SITE_OTHER): Payer: 59 | Admitting: Neurology

## 2021-04-04 VITALS — BP 126/79 | HR 60 | Ht 66.0 in | Wt 223.4 lb

## 2021-04-04 DIAGNOSIS — G40009 Localization-related (focal) (partial) idiopathic epilepsy and epileptic syndromes with seizures of localized onset, not intractable, without status epilepticus: Secondary | ICD-10-CM

## 2021-04-04 MED ORDER — LEVETIRACETAM 500 MG PO TABS: 500.0000 mg | ORAL_TABLET | Freq: Two times a day (BID) | ORAL | 2 refills | Status: DC

## 2021-04-04 NOTE — Progress Notes (Signed)
NEUROLOGY CONSULTATION NOTE  Davanna He MRN: [id number] DOB: 10/02/1965  Referring provider: Dr. Ripley Fraise Primary care provider: Dr. Phill Myron  Reason for consult:  new onset seizures  Dear Dr Christy Gentles:  Thank you for your kind referral of [facility] for consultation of the above symptoms. Although her history is well known to you, please allow me to reiterate it for the purpose of our medical record. She is alone in the office today. Records and images were personally reviewed where available.   HISTORY OF PRESENT ILLNESS: This is a very pleasant 55 year old right-handed woman with a history of hypertension, remote history of aortic dissection s/p aortic graft placement, presenting for new onset seizures. She was in her usual state of health until 01/29/21 while at work at [employer] when she was noted to be staring with nonsensical speech, unresponsive to questions. She was brought to the ER where she was noted to have clear but nonsensical speech. She was making complete sentences that were disconnected from the question asked. CT head negative. She started improving after brain MRI which did not show any acute changes. There was mild chronic microvascular disease and small remote right cerebellar infarct. She was amnestic of events and reported feeling foggy with word-finding difficulties. Her EEG showed left frontotemporal slowing and a spike in the left frontotemporal region.  She was discharged home on Levetiracetam but did not start medication stating she was in denial. She was back in the ER on 03/17/21 when she had another aphasic episode while talking to her niece on the phone. Her niece called EMS and they found her confused, smiling at them, talking about things at work. She had "seizure-like activity" en route and was given Midazolam. She woke up in the ER feeling exhausted, no focal weakness, tongue bite or incontinence. Her right shoulder felt sore. She  denies any further seizures since starting Levetiracetam 500mg  BID. She lives alone. On further questioning, she reports that for the past month or so, she has been having episodes of deja vu where she felt she did something but had not done it. She denies any olfactory/gustatory hallucinations, deja vu, rising epigastric sensation, focal numbness/tingling/weakness, myoclonic jerks. She had a headache for 2-3 days after the recent seizure, none since. No dizziness, diplopia, dysarthria/dysphagia, neck/back pain, bowel/bladder dysfunction. Her mouth gets dry a lot. She has been feeling a little anxious, crying a lot, which is new since July/August (prior to starting LEV). She feels the Levetiracetam is working for her. She notes mood is not good, people have noticed she is not as "happy go-lucky," snapping at people since July/August. She reports taking Valium for anxiety in her 23s. Sleep is good. She denies any triggers to the recent seizures. Memory comes and goes, "not that good." She used to remember everything but now has word-finding difficulties.   Epilepsy Risk Factors:  Maternal cousin has seizures. Otherwise she had a normal birth and early development.  There is no history of febrile convulsions, CNS infections such as meningitis/encephalitis, significant traumatic brain injury, neurosurgical procedures.   PAST MEDICAL HISTORY: Past Medical History:  Diagnosis Date   Aortic dissection ([place])    Status post repair 5/16   Chronic diastolic CHF (congestive heart failure) ([place])    Echo 6/16:  Severe LVH, EF 60-65%, no RWMA, Gr 1 DD, LA upper limits of normal, mild RAE, aneurysmal intra-atrial septum   Fibroid uterus    Hypertension     PAST SURGICAL HISTORY: Past  Surgical History:  Procedure Laterality Date   REPLACEMENT ASCENDING AORTA N/A 11/03/2014   Procedure: REPLACEMENT ASCENDING AORTA with a 62mm hemashield graft,  resuspension of aortic valve, hypothermic circulatory arrest and  cardiopulmonary bypass.;  Surgeon: [name], MD;  Location: [facility];  Service: Open Heart Surgery;  Laterality: N/A;    MEDICATIONS: Current Outpatient Medications on File Prior to Visit  Medication Sig Dispense Refill   acetaminophen (TYLENOL) 500 MG tablet Take 500 mg by mouth every 6 (six) hours as needed for mild pain.     aspirin EC 81 MG tablet Take 1 tablet (81 mg total) by mouth daily. 90 tablet 3   carvedilol (COREG) 25 MG tablet Take 1 tablet (25 mg total) by mouth 2 (two) times daily with a meal. 180 tablet 1   furosemide (LASIX) 20 MG tablet Take 1 tablet (20 mg total) by mouth as needed for edema. 90 tablet 1   hydrochlorothiazide (HYDRODIURIL) 25 MG tablet Take 1 tablet (25 mg total) by mouth daily. 90 tablet 3   levETIRAcetam (KEPPRA) 500 MG tablet Take 1 tablet (500 mg total) by mouth 2 (two) times daily. 60 tablet 1   potassium chloride (KLOR-CON) 10 MEQ tablet Take 1 tablet (10 mEq total) by mouth daily. 90 tablet 1   simvastatin (ZOCOR) 20 MG tablet Take 1 tablet (20 mg total) by mouth daily. 90 tablet 1   [DISCONTINUED] amLODipine (NORVASC) 10 MG tablet Take 1 tablet (10 mg total) by mouth daily. 90 tablet 1   [DISCONTINUED] lisinopril (ZESTRIL) 40 MG tablet Take 1 tablet (40 mg total) by mouth daily. 90 tablet 1   No current facility-administered medications on file prior to visit.    ALLERGIES: Allergies  Allergen Reactions   Influenza Vaccines     Can tolerate egg free vaccine     FAMILY HISTORY: Family History  Problem Relation Age of Onset   Hypertension Mother    Breast cancer Mother    Brain cancer Mother    Stroke Father    Heart failure Father    Hypertension Father    Diabetes Father    Hyperlipidemia Father    Hypertension Sister    Hypertension Brother    Cancer Sister    Breast cancer Maternal Aunt    Breast cancer Paternal Aunt    Breast cancer Maternal Grandmother    Breast cancer Paternal Grandmother    Heart attack Neg Hx      SOCIAL HISTORY: Social History   Socioeconomic History   Marital status: Single    Spouse name: Not on file   Number of children: 0   Years of education: Not on file   Highest education level: Some college, no degree  Occupational History   Not on file  Tobacco Use   Smoking status: Some Days    Packs/day: 1.00    Types: Cigarettes    Start date: 11/03/2014   Smokeless tobacco: Never  Vaping Use   Vaping Use: Never used  Substance and Sexual Activity   Alcohol use: Not Currently    Alcohol/week: 0.0 standard drinks    Comment: occasional   Drug use: Not Currently    Types: Marijuana   Sexual activity: Not Currently  Other Topics Concern   Not on file  Social History Narrative   Not on file   Social Determinants of Health   Financial Resource Strain: Not on file  Food Insecurity: Not on file  Transportation Needs: Not on file  Physical  Activity: Not on file  Stress: Not on file  Social Connections: Not on file  Intimate Partner Violence: Not on file     PHYSICAL EXAM: Vitals:   04/04/21 1022  BP: 126/79  Pulse: 60  SpO2: 98%   General: No acute distress Head:  Normocephalic/atraumatic Skin/Extremities: No rash, no edema Neurological Exam: Mental status: alert and oriented to person, place, and time, no dysarthria or aphasia, Fund of knowledge is appropriate.  Recent and remote memory are intact, 2/3 delayed recall.  Attention and concentration are normal, 5/5 WORLD backwards.    Able to name objects and repeat phrases. Cranial nerves: CN I: not tested CN II: pupils equal, round and reactive to light, visual fields intact CN III, IV, VI:  full range of motion, no nystagmus, no ptosis CN V: facial sensation intact CN VII: upper and lower face symmetric CN VIII: hearing intact to conversation Bulk & Tone: normal, no fasciculations. Motor: 5/5 throughout with no pronator drift. Sensation: intact to light touch, cold, pin, vibration sense.  No extinction  to double simultaneous stimulation.  Romberg test negative Deep Tendon Reflexes: +1 throughout Cerebellar: no incoordination on finger to nose testing Gait: narrow-based and steady, able to tandem walk adequately. 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 EEG showed slowing and epileptiform activity over the left frontotemporal region. MRI brain no acute changes. She is now on Levetiracetam 500mg  BID with no seizures since 03/17/21. She reports some depression that started prior to initiation of Levetiracetam, continue to monitor. [place] driving laws were discussed with the patient, and she knows to stop driving after a seizure, until 6 months seizure-free. Follow-up in 3 months, call for any changes.    Thank you for allowing me to participate in the care of this patient. Please do not hesitate to call for any questions or concerns.   Ellouise Newer, M.D.  CC: Dr. Young Berry, Dr. Juleen China

## 2021-04-04 NOTE — Patient Instructions (Signed)
Good to meet you. Continue Keppra 500mg  twice a day. Follow-up in 3 months, call for any changes.   Seizure Precautions: 1. If medication has been prescribed for you to prevent seizures, take it exactly as directed.  Do not stop taking the medicine without talking to your doctor first, even if you have not had a seizure in a long time.   2. Avoid activities in which a seizure would cause danger to yourself or to others.  Don't operate dangerous machinery, swim alone, or climb in high or dangerous places, such as on ladders, roofs, or girders.  Do not drive unless your doctor says you may.  3. If you have any warning that you may have a seizure, lay down in a safe place where you can't hurt yourself.    4.  No driving for 6 months from last seizure, as per [facility].   Please refer to the following link on the [place] website for more information: http://www.epilepsyfoundation.org/[email or web address]/driving/drivingu.cfm   5.  Maintain good sleep hygiene. Avoid alcohol.  6.  Contact your doctor if you have any problems that may be related to the medicine you are taking.  7.  Call 911 and bring the patient back to the ED if:        A.  The seizure lasts longer than 5 minutes.       B.  The patient doesn't awaken shortly after the seizure  C.  The patient has new problems such as difficulty seeing, speaking or moving  D.  The patient was injured during the seizure  E.  The patient has a temperature over 102 F (39C)  F.  The patient vomited and now is having trouble breathing

## 2021-05-07 ENCOUNTER — Other Ambulatory Visit: Payer: Self-pay | Admitting: Internal Medicine

## 2021-05-07 DIAGNOSIS — Z95828 Presence of other vascular implants and grafts: Secondary | ICD-10-CM

## 2021-05-07 DIAGNOSIS — I10 Essential (primary) hypertension: Secondary | ICD-10-CM

## 2021-05-12 ENCOUNTER — Other Ambulatory Visit: Payer: Self-pay

## 2021-05-12 ENCOUNTER — Ambulatory Visit (INDEPENDENT_AMBULATORY_CARE_PROVIDER_SITE_OTHER): Payer: 59 | Admitting: Nurse Practitioner

## 2021-05-12 DIAGNOSIS — I10 Essential (primary) hypertension: Secondary | ICD-10-CM | POA: Diagnosis not present

## 2021-05-12 DIAGNOSIS — Z95828 Presence of other vascular implants and grafts: Secondary | ICD-10-CM

## 2021-05-12 MED ORDER — AMLODIPINE BESYLATE 10 MG PO TABS: 10.0000 mg | ORAL_TABLET | Freq: Every day | ORAL | 1 refills | Status: DC

## 2021-05-12 MED ORDER — HYDROCHLOROTHIAZIDE 25 MG PO TABS: 25.0000 mg | ORAL_TABLET | Freq: Every day | ORAL | 3 refills | Status: DC

## 2021-05-12 MED ORDER — CARVEDILOL 25 MG PO TABS: 25.0000 mg | ORAL_TABLET | Freq: Two times a day (BID) | ORAL | 1 refills | Status: DC

## 2021-05-12 MED ORDER — LEVETIRACETAM 500 MG PO TABS: 500.0000 mg | ORAL_TABLET | Freq: Two times a day (BID) | ORAL | 2 refills | Status: DC

## 2021-05-12 MED ORDER — SIMVASTATIN 20 MG PO TABS: 20.0000 mg | ORAL_TABLET | Freq: Every day | ORAL | 1 refills | Status: DC

## 2021-05-12 MED ORDER — POTASSIUM CHLORIDE ER 10 MEQ PO TBCR: 10.0000 meq | EXTENDED_RELEASE_TABLET | Freq: Every day | ORAL | 1 refills | Status: DC

## 2021-05-12 MED ORDER — LISINOPRIL 40 MG PO TABS: 40.0000 mg | ORAL_TABLET | Freq: Every day | ORAL | 1 refills | Status: DC

## 2021-05-12 NOTE — Progress Notes (Signed)
@Patient  ID: Ashley Guerrero, female    DOB: 06-26-1966, 55 y.o.   MRN: [id number]  Chief Complaint  Patient presents with   Medication Refill     Referring provider: Caryl Never*  HPI  Patient presents today for medication refills.  This is a former patient of Dr. Juleen China.  We discussed that she will need to establish care with a new provider here at this practice.  She last saw Dr. Juleen China in June 2022.  Overall patient has been doing well on current medications.  She has been seen by neurology for new onset seizures.  She has been started on Keppra for this.  She states that she needs refills on all of her medications today. Denies f/c/s, n/v/d, hemoptysis, PND, chest pain or edema.      Allergies  Allergen Reactions   Influenza Vaccines     Can tolerate egg free vaccine     Immunization History  Administered Date(s) Administered   PFIZER(Purple Top)SARS-COV-2 Vaccination 09/27/2019, 10/18/2019, 04/19/2020   Tdap 03/23/2019    Past Medical History:  Diagnosis Date   Aortic dissection ([facility])    Status post repair 5/16   Chronic diastolic CHF (congestive heart failure) ([place])    Echo 6/16:  Severe LVH, EF 60-65%, no RWMA, Gr 1 DD, LA upper limits of normal, mild RAE, aneurysmal intra-atrial septum   Fibroid uterus    Hypertension     Tobacco History: Social History   Tobacco Use  Smoking Status Some Days   Packs/day: 1.00   Types: Cigarettes   Start date: 11/03/2014  Smokeless Tobacco Never   Ready to quit: No Counseling given: Yes   Outpatient Encounter Medications as of 05/12/2021  Medication Sig   acetaminophen (TYLENOL) 500 MG tablet Take 500 mg by mouth every 6 (six) hours as needed for mild pain.   amLODipine (NORVASC) 10 MG tablet Take 1 tablet (10 mg total) by mouth daily.   aspirin EC 81 MG tablet Take 1 tablet (81 mg total) by mouth daily.   carvedilol (COREG) 25 MG tablet Take 1 tablet (25 mg total) by mouth 2 (two) times daily with a  meal.   furosemide (LASIX) 20 MG tablet Take 1 tablet (20 mg total) by mouth as needed for edema.   hydrochlorothiazide (HYDRODIURIL) 25 MG tablet Take 1 tablet (25 mg total) by mouth daily.   levETIRAcetam (KEPPRA) 500 MG tablet Take 1 tablet (500 mg total) by mouth 2 (two) times daily.   lisinopril (ZESTRIL) 40 MG tablet Take 1 tablet (40 mg total) by mouth daily.   potassium chloride (KLOR-CON) 10 MEQ tablet Take 1 tablet (10 mEq total) by mouth daily.   simvastatin (ZOCOR) 20 MG tablet Take 1 tablet (20 mg total) by mouth daily.   [DISCONTINUED] amLODipine (NORVASC) 10 MG tablet Take 1 tablet (10 mg total) by mouth daily.   [DISCONTINUED] carvedilol (COREG) 25 MG tablet Take 1 tablet (25 mg total) by mouth 2 (two) times daily with a meal.   [DISCONTINUED] hydrochlorothiazide (HYDRODIURIL) 25 MG tablet Take 1 tablet (25 mg total) by mouth daily.   [DISCONTINUED] levETIRAcetam (KEPPRA) 500 MG tablet Take 1 tablet (500 mg total) by mouth 2 (two) times daily.   [DISCONTINUED] lisinopril (ZESTRIL) 40 MG tablet Take 1 tablet (40 mg total) by mouth daily.   [DISCONTINUED] potassium chloride (KLOR-CON) 10 MEQ tablet Take 1 tablet (10 mEq total) by mouth daily.   [DISCONTINUED] simvastatin (ZOCOR) 20 MG tablet Take 1 tablet (20 mg total) by  mouth daily.   No facility-administered encounter medications on file as of 05/12/2021.     Review of Systems  Review of Systems  Constitutional: Negative.   HENT: Negative.    Cardiovascular: Negative.   Gastrointestinal: Negative.   Allergic/Immunologic: Negative.   Neurological: Negative.   Psychiatric/Behavioral: Negative.        Physical Exam  BP 134/83   Pulse 63   Resp 18   LMP 10/29/2014   SpO2 94%   Wt Readings from Last 5 Encounters:  04/04/21 223 lb 6.4 oz (101.3 kg)  03/17/21 229 lb 4.5 oz (104 kg)  01/29/21 229 lb 4.5 oz (104 kg)  12/10/20 228 lb (103.4 kg)  11/08/20 231 lb 6.4 oz (105 kg)     Physical Exam Vitals and  nursing note reviewed.  Constitutional:      General: She is not in acute distress.    Appearance: She is well-developed.  Cardiovascular:     Rate and Rhythm: Normal rate and regular rhythm.  Pulmonary:     Effort: Pulmonary effort is normal.     Breath sounds: Normal breath sounds.  Neurological:     Mental Status: She is alert and oriented to person, place, and time.     Lab Results:  CBC    Component Value Date/Time   WBC 6.8 03/17/2021 2300   RBC 4.37 03/17/2021 2300   HGB 13.7 03/17/2021 2300   HGB 13.8 05/09/2020 1357   HCT 41.6 03/17/2021 2300   HCT 40.7 05/09/2020 1357   PLT 287 03/17/2021 2300   PLT 296 05/09/2020 1357   MCV 95.2 03/17/2021 2300   MCV 92 05/09/2020 1357   MCH 31.4 03/17/2021 2300   MCHC 32.9 03/17/2021 2300   RDW 14.0 03/17/2021 2300   RDW 13.2 05/09/2020 1357   LYMPHSABS 1.6 03/17/2021 2300   LYMPHSABS 2.8 05/09/2020 1357   MONOABS 0.3 03/17/2021 2300   EOSABS 0.1 03/17/2021 2300   EOSABS 0.2 05/09/2020 1357   BASOSABS 0.0 03/17/2021 2300   BASOSABS 0.0 05/09/2020 1357    BMET    Component Value Date/Time   NA 135 03/17/2021 2300   NA 141 01/01/2021 1406   K 3.5 03/17/2021 2300   CL 99 03/17/2021 2300   CO2 28 03/17/2021 2300   GLUCOSE 115 (H) 03/17/2021 2300   BUN 10 03/17/2021 2300   BUN 15 01/01/2021 1406   CREATININE 0.85 03/17/2021 2300   CALCIUM 9.0 03/17/2021 2300   GFRNONAA >60 03/17/2021 2300   GFRAA 115 05/09/2020 1357    BNP No results found for: BNP  ProBNP    Component Value Date/Time   PROBNP 281.0 (H) 12/06/2014 1000    Imaging: No results found.   Assessment & Plan:   Essential hypertension, malignant Given history of aortic dissection, striving for more tight P control. BP initially 154/86 at presentation, still above goal on repeat. Continue DASH diet.  - continue current medications - refills sent to pharmacy   Follow up:  Follow up in 3 months to establish care with Dr. Redmond Pulling or  Amy     Fenton Foy, NP 05/13/2021

## 2021-05-12 NOTE — Patient Instructions (Signed)
Essential hypertension, malignant Given history of aortic dissection, striving for more tight P control. BP initially 154/86 at presentation, still above goal on repeat. Continue DASH diet.  - continue current medications - refills sent to pharmacy   Follow up:  Follow up in 3 months to establish care with Dr. Redmond Pulling or Amy

## 2021-05-13 ENCOUNTER — Encounter: Payer: Self-pay | Admitting: Nurse Practitioner

## 2021-05-13 NOTE — Assessment & Plan Note (Signed)
Given history of aortic dissection, striving for more tight P control. BP initially 154/86 at presentation, still above goal on repeat. Continue DASH diet.  - continue current medications - refills sent to pharmacy   Follow up:  Follow up in 3 months to establish care with Dr. Redmond Pulling or Amy

## 2021-07-03 ENCOUNTER — Ambulatory Visit: Payer: 59 | Admitting: Neurology

## 2021-07-03 ENCOUNTER — Encounter: Payer: Self-pay | Admitting: Neurology

## 2021-07-03 ENCOUNTER — Other Ambulatory Visit: Payer: Self-pay

## 2021-07-03 VITALS — BP 147/77 | HR 67 | Ht 66.0 in | Wt 217.2 lb

## 2021-07-03 DIAGNOSIS — G40009 Localization-related (focal) (partial) idiopathic epilepsy and epileptic syndromes with seizures of localized onset, not intractable, without status epilepticus: Secondary | ICD-10-CM | POA: Diagnosis not present

## 2021-07-03 MED ORDER — LEVETIRACETAM 500 MG PO TABS: 500.0000 mg | ORAL_TABLET | Freq: Two times a day (BID) | ORAL | 3 refills | Status: DC

## 2021-07-03 NOTE — Patient Instructions (Signed)
Always good to see you. Continue Keppra 500mg  twice a day. Follow-up in 6 months, call for any changes.  Seizure Precautions: 1. If medication has been prescribed for you to prevent seizures, take it exactly as directed.  Do not stop taking the medicine without talking to your doctor first, even if you have not had a seizure in a long time.   2. Avoid activities in which a seizure would cause danger to yourself or to others.  Don't operate dangerous machinery, swim alone, or climb in high or dangerous places, such as on ladders, roofs, or girders.  Do not drive unless your doctor says you may.  3. If you have any warning that you may have a seizure, lay down in a safe place where you can't hurt yourself.    4.  No driving for 6 months from last seizure, as per [facility].   Please refer to the following link on the [place] website for more information: http://www.epilepsyfoundation.org/answerplace/Social/driving/drivingu.cfm   5.  Maintain good sleep hygiene. Avoid alcohol.  6.  Contact your doctor if you have any problems that may be related to the medicine you are taking.  7.  Call 911 and bring the patient back to the ED if:        A.  The seizure lasts longer than 5 minutes.       B.  The patient doesn't awaken shortly after the seizure  C.  The patient has new problems such as difficulty seeing, speaking or moving  D.  The patient was injured during the seizure  E.  The patient has a temperature over 102 F (39C)  F.  The patient vomited and now is having trouble breathing

## 2021-07-03 NOTE — Progress Notes (Signed)
NEUROLOGY FOLLOW UP OFFICE NOTE  Ashley Guerrero [id number] February 26, 1966  HISTORY OF PRESENT ILLNESS: I had the pleasure of seeing Ashley Guerrero in follow-up in the neurology clinic on 07/03/2021.  The patient was last seen 3 months ago for left temporal lobe epilepsy with seizures that started in 01/2021. Her last seizure was on 03/17/2021. She is on Levetiracetam 500mg  BID without side effects. She denies any staring/unresponsive episodes, aphasia/word-finding difficulties, gaps in time, olfactory/gustatory hallucinations, focal numbness/tingling/weakness, myoclonic jerks. She has headaches that she attributes to a change in her work schedule affecting her eating habits. She would have a slight headache and has to eat something, and headache resolves. She does not take any medication for headache. She takes Tylenol arthritis for body aches, her right sciatica bothers her more with cold weather. She does yoga as well. No dizziness, vision changes, no falls. Sleep is okay. Mood has leveled out, she is not crying a lot anymore and tends to be more jovial.    History on Initial Assessment 04/04/2021: This is a very pleasant 55 year old right-handed woman with a history of hypertension, remote history of aortic dissection s/p aortic graft placement, presenting for new onset seizures. She was in her usual state of health until 01/29/21 while at work at [employer] when she was noted to be staring with nonsensical speech, unresponsive to questions. She was brought to the ER where she was noted to have clear but nonsensical speech. She was making complete sentences that were disconnected from the question asked. CT head negative. She started improving after brain MRI which did not show any acute changes. There was mild chronic microvascular disease and small remote right cerebellar infarct. She was amnestic of events and reported feeling foggy with word-finding difficulties. Her EEG showed left frontotemporal  slowing and a spike in the left frontotemporal region.  She was discharged home on Levetiracetam but did not start medication stating she was in denial. She was back in the ER on 03/17/21 when she had another aphasic episode while talking to her niece on the phone. Her niece called EMS and they found her confused, smiling at them, talking about things at work. She had "seizure-like activity" en route and was given Midazolam. She woke up in the ER feeling exhausted, no focal weakness, tongue bite or incontinence. Her right shoulder felt sore. She denies any further seizures since starting Levetiracetam 500mg  BID. She lives alone. On further questioning, she reports that for the past month or so, she has been having episodes of deja vu where she felt she did something but had not done it. She denies any olfactory/gustatory hallucinations, deja vu, rising epigastric sensation, focal numbness/tingling/weakness, myoclonic jerks. She had a headache for 2-3 days after the recent seizure, none since. No dizziness, diplopia, dysarthria/dysphagia, neck/back pain, bowel/bladder dysfunction. Her mouth gets dry a lot. She has been feeling a little anxious, crying a lot, which is new since July/August (prior to starting LEV). She feels the Levetiracetam is working for her. She notes mood is not good, people have noticed she is not as "happy go-lucky," snapping at people since July/August. She reports taking Valium for anxiety in her 87s. Sleep is good. She denies any triggers to the recent seizures. Memory comes and goes, "not that good." She used to remember everything but now has word-finding difficulties.   Epilepsy Risk Factors:  Maternal cousin has seizures. Otherwise she had a normal birth and early development.  There is no history of febrile convulsions,  CNS infections such as meningitis/encephalitis, significant traumatic brain injury, neurosurgical procedures.   PAST MEDICAL HISTORY: Past Medical History:   Diagnosis Date   Aortic dissection ([place])    Status post repair 5/16   Chronic diastolic CHF (congestive heart failure) ([place])    Echo 6/16:  Severe LVH, EF 60-65%, no RWMA, Gr 1 DD, LA upper limits of normal, mild RAE, aneurysmal intra-atrial septum   Fibroid uterus    Hypertension     MEDICATIONS: Current Outpatient Medications on File Prior to Visit  Medication Sig Dispense Refill   acetaminophen (TYLENOL) 500 MG tablet Take 500 mg by mouth every 6 (six) hours as needed for mild pain.     amLODipine (NORVASC) 10 MG tablet Take 1 tablet (10 mg total) by mouth daily. 90 tablet 1   aspirin EC 81 MG tablet Take 1 tablet (81 mg total) by mouth daily. 90 tablet 3   carvedilol (COREG) 25 MG tablet Take 1 tablet (25 mg total) by mouth 2 (two) times daily with a meal. 180 tablet 1   furosemide (LASIX) 20 MG tablet Take 1 tablet (20 mg total) by mouth as needed for edema. 90 tablet 1   hydrochlorothiazide (HYDRODIURIL) 25 MG tablet Take 1 tablet (25 mg total) by mouth daily. 90 tablet 3   levETIRAcetam (KEPPRA) 500 MG tablet Take 1 tablet (500 mg total) by mouth 2 (two) times daily. 180 tablet 2   lisinopril (ZESTRIL) 40 MG tablet Take 1 tablet (40 mg total) by mouth daily. 90 tablet 1   potassium chloride (KLOR-CON) 10 MEQ tablet Take 1 tablet (10 mEq total) by mouth daily. 90 tablet 1   simvastatin (ZOCOR) 20 MG tablet Take 1 tablet (20 mg total) by mouth daily. 90 tablet 1   No current facility-administered medications on file prior to visit.    ALLERGIES: Allergies  Allergen Reactions   Influenza Vaccines     Can tolerate egg free vaccine     FAMILY HISTORY: Family History  Problem Relation Age of Onset   Hypertension Mother    Breast cancer Mother    Brain cancer Mother    Stroke Father    Heart failure Father    Hypertension Father    Diabetes Father    Hyperlipidemia Father    Hypertension Sister    Hypertension Brother    Cancer Sister    Breast cancer Maternal Aunt     Breast cancer Paternal Aunt    Breast cancer Maternal Grandmother    Breast cancer Paternal Grandmother    Heart attack Neg Hx     SOCIAL HISTORY: Social History   Socioeconomic History   Marital status: Single    Spouse name: Not on file   Number of children: 0   Years of education: Not on file   Highest education level: Some college, no degree  Occupational History   Not on file  Tobacco Use   Smoking status: Some Days    Packs/day: 1.00    Types: Cigarettes    Start date: 11/03/2014   Smokeless tobacco: Never  Vaping Use   Vaping Use: Never used  Substance and Sexual Activity   Alcohol use: Not Currently    Alcohol/week: 0.0 standard drinks    Comment: occasional   Drug use: Not Currently    Types: Marijuana   Sexual activity: Not Currently  Other Topics Concern   Not on file  Social History Narrative   Right handed    Social Determinants of Health  Financial Resource Strain: Not on file  Food Insecurity: Not on file  Transportation Needs: Not on file  Physical Activity: Not on file  Stress: Not on file  Social Connections: Not on file  Intimate Partner Violence: Not on file     PHYSICAL EXAM: Vitals:   07/03/21 1024  BP: (!) 147/77  Pulse: 67  SpO2: 97%   General: No acute distress Head:  Normocephalic/atraumatic Skin/Extremities: No rash, no edema Neurological Exam: alert and awake. No aphasia or dysarthria. Fund of knowledge is appropriate.  Attention and concentration are normal.   Cranial nerves: Pupils equal, round. Extraocular movements intact with no nystagmus. Visual fields full.  No facial asymmetry.  Motor: Bulk and tone normal, muscle strength 5/5 throughout with no pronator drift.   Finger to nose testing intact.  Gait narrow-based and steady, able to tandem walk adequately.  Romberg negative.   IMPRESSION: This is a very pleasant 55 year old right-handed woman with a history of hypertension, remote history of aortic dissection s/p  aortic graft placement, with newly diagnosed left temporal lobe epilepsy since 01/2021. EEG showed slowing and epileptiform activity over the left frontotemporal region. MRI brain no acute changes. She has been seizure-free since 03/17/21 on Levetiracetam 500mg  BID without side effects, refills sent. She is aware of [place] driving laws to stop driving after a seizure, until 6 months seizure-free. We discussed avoidance of seizure triggers. Follow-up in 6 months, call for any changes.    Thank you for allowing me to participate in her care.  Please do not hesitate to call for any questions or concerns.    Ellouise Newer, M.D.   CC: Dr. Juleen China

## 2021-08-05 ENCOUNTER — Other Ambulatory Visit: Payer: Self-pay | Admitting: Internal Medicine

## 2021-08-05 DIAGNOSIS — Z95828 Presence of other vascular implants and grafts: Secondary | ICD-10-CM

## 2021-08-08 NOTE — Progress Notes (Signed)
Patient ID: Ashley Guerrero, female    DOB: [birth year]  MRN: [id number]  CC: Hypertension Follow-Up   Subjective: Ashley Guerrero is a 56 y.o. female who presents for hypertension follow-up.   Her concerns today include:  HYPERTENSION FOLLOW-UP: 05/12/2021 with Lazaro Arms, NP: Given history of aortic dissection, striving for more tight P control. BP initially 154/86 at presentation, still above goal on repeat. Continue DASH diet.  - continue current medications - refills sent to pharmacy   08/15/2021: Doing well on current regimen, no issues/concerns. Home blood pressures lower in the home setting but still at goal today in office. Monitoring the salt in her food. She is physically active. Smoking occasionally. Denies shortness of breath, chest pain, and additional red flag symptoms.    Patient Active Problem List   Diagnosis Date Noted   TIA (transient ischemic attack) 01/29/2021   Essential hypertension 01/29/2021   S/P ascending aortic replacement 11/04/2014   Essential hypertension, malignant 11/04/2014   Large  Abdominal mass- question uterine origin  11/04/2014     Current Outpatient Medications on File Prior to Visit  Medication Sig Dispense Refill   acetaminophen (TYLENOL) 500 MG tablet Take 500 mg by mouth every 6 (six) hours as needed for mild pain.     aspirin EC 81 MG tablet Take 1 tablet (81 mg total) by mouth daily. 90 tablet 3   carvedilol (COREG) 25 MG tablet Take 1 tablet (25 mg total) by mouth 2 (two) times daily with a meal. 180 tablet 1   furosemide (LASIX) 20 MG tablet Take 1 tablet (20 mg total) by mouth as needed for edema. 90 tablet 1   hydrochlorothiazide (HYDRODIURIL) 25 MG tablet Take 1 tablet (25 mg total) by mouth daily. 90 tablet 3   levETIRAcetam (KEPPRA) 500 MG tablet Take 1 tablet (500 mg total) by mouth 2 (two) times daily. 180 tablet 3   potassium chloride (KLOR-CON) 10 MEQ tablet Take 1 tablet (10 mEq total) by mouth daily. 90 tablet 1    simvastatin (ZOCOR) 20 MG tablet Take 1 tablet (20 mg total) by mouth daily. 90 tablet 1   No current facility-administered medications on file prior to visit.    Allergies  Allergen Reactions   Influenza Vaccines     Can tolerate egg free vaccine     Social History   Socioeconomic History   Marital status: Single    Spouse name: Not on file   Number of children: 0   Years of education: Not on file   Highest education level: Some college, no degree  Occupational History   Not on file  Tobacco Use   Smoking status: Some Days    Packs/day: 1.00    Types: Cigarettes    Start date: 11/03/2014   Smokeless tobacco: Never  Vaping Use   Vaping Use: Never used  Substance and Sexual Activity   Alcohol use: Not Currently    Alcohol/week: 0.0 standard drinks    Comment: occasional   Drug use: Not Currently    Types: Marijuana   Sexual activity: Not Currently  Other Topics Concern   Not on file  Social History Narrative   Right handed    Social Determinants of Health   Financial Resource Strain: Not on file  Food Insecurity: Not on file  Transportation Needs: Not on file  Physical Activity: Not on file  Stress: Not on file  Social Connections: Not on file  Intimate Partner Violence: Not on file  Family History  Problem Relation Age of Onset   Hypertension Mother    Breast cancer Mother    Brain cancer Mother    Stroke Father    Heart failure Father    Hypertension Father    Diabetes Father    Hyperlipidemia Father    Hypertension Sister    Hypertension Brother    Cancer Sister    Breast cancer Maternal Aunt    Breast cancer Paternal Aunt    Breast cancer Maternal Grandmother    Breast cancer Paternal Grandmother    Heart attack Neg Hx     Past Surgical History:  Procedure Laterality Date   REPLACEMENT ASCENDING AORTA N/A 11/03/2014   Procedure: REPLACEMENT ASCENDING AORTA with a 56mm hemashield graft,  resuspension of aortic valve, hypothermic circulatory  arrest and cardiopulmonary bypass.;  Surgeon: [name], MD;  Location: [place];  Service: Open Heart Surgery;  Laterality: N/A;    ROS: Review of Systems Negative except as stated above  PHYSICAL EXAM: BP 134/77 (BP Location: Left Arm, Patient Position: Sitting, Cuff Size: Large)    Pulse 65    Temp 98.7 F (37.1 C)    Resp 18    Ht 5' 5.98" (1.676 m)    Wt 213 lb (96.6 kg)    LMP 10/29/2014    SpO2 95%    BMI 34.40 kg/m    Physical Exam HENT:     Head: Normocephalic and atraumatic.  Eyes:     Extraocular Movements: Extraocular movements intact.     Conjunctiva/sclera: Conjunctivae normal.     Pupils: Pupils are equal, round, and reactive to light.  Cardiovascular:     Rate and Rhythm: Normal rate and regular rhythm.     Pulses: Normal pulses.     Heart sounds: Normal heart sounds.  Pulmonary:     Effort: Pulmonary effort is normal.     Breath sounds: Normal breath sounds.  Musculoskeletal:     Cervical back: Normal range of motion and neck supple.  Neurological:     General: No focal deficit present.     Mental Status: She is alert and oriented to person, place, and time.  Psychiatric:        Mood and Affect: Mood normal.        Behavior: Behavior normal.   ASSESSMENT AND PLAN: 1. Essential (primary) hypertension: - Continue Lisinopril and Amlodipine as prescribed.  - Counseled on blood pressure goal of less than 130/80, low-sodium, DASH diet, medication compliance, 150 minutes of moderate intensity exercise per week as tolerated. Discussed medication compliance, adverse effects. - Update BMP.  - Follow-up with primary provider in 3 months or sooner if needed.  - Basic Metabolic Panel - lisinopril (ZESTRIL) 40 MG tablet; Take 1 tablet (40 mg total) by mouth daily.  Dispense: 90 tablet; Refill: 0 - amLODipine (NORVASC) 10 MG tablet; Take 1 tablet (10 mg total) by mouth daily.  Dispense: 90 tablet; Refill: 0    Patient was given the opportunity to ask questions.   Patient verbalized understanding of the plan and was able to repeat key elements of the plan. Patient was given clear instructions to go to Emergency Department or return to medical center if symptoms don't improve, worsen, or new problems develop.The patient verbalized understanding.   Orders Placed This Encounter  Procedures   Basic Metabolic Panel    Requested Prescriptions   Signed Prescriptions Disp Refills   lisinopril (ZESTRIL) 40 MG tablet 90 tablet 0  Sig: Take 1 tablet (40 mg total) by mouth daily.   amLODipine (NORVASC) 10 MG tablet 90 tablet 0    Sig: Take 1 tablet (10 mg total) by mouth daily.    Return in about 3 months (around 11/12/2021) for Follow-Up or next available with Dorna Mai, MD .  Camillia Herter, NP

## 2021-08-14 ENCOUNTER — Other Ambulatory Visit: Payer: Self-pay | Admitting: Surgery

## 2021-08-14 DIAGNOSIS — I71019 Dissection of thoracic aorta, unspecified: Secondary | ICD-10-CM

## 2021-08-15 ENCOUNTER — Encounter: Payer: Self-pay | Admitting: Family

## 2021-08-15 ENCOUNTER — Other Ambulatory Visit: Payer: Self-pay

## 2021-08-15 ENCOUNTER — Ambulatory Visit (INDEPENDENT_AMBULATORY_CARE_PROVIDER_SITE_OTHER): Payer: Managed Care, Other (non HMO) | Admitting: Family

## 2021-08-15 VITALS — BP 134/77 | HR 65 | Temp 98.7°F | Resp 18 | Ht 65.98 in | Wt 213.0 lb

## 2021-08-15 DIAGNOSIS — I10 Essential (primary) hypertension: Secondary | ICD-10-CM | POA: Diagnosis not present

## 2021-08-15 MED ORDER — AMLODIPINE BESYLATE 10 MG PO TABS: 10.0000 mg | ORAL_TABLET | Freq: Every day | ORAL | 0 refills | Status: DC

## 2021-08-15 MED ORDER — LISINOPRIL 40 MG PO TABS: 40.0000 mg | ORAL_TABLET | Freq: Every day | ORAL | 0 refills | Status: DC

## 2021-08-15 NOTE — Progress Notes (Signed)
Pt presents for hypertension

## 2021-08-16 LAB — BASIC METABOLIC PANEL
BUN/Creatinine Ratio: 17 (ref 9–23)
BUN: 17 mg/dL (ref 6–24)
CO2: 24 mmol/L (ref 20–29)
Calcium: 9.5 mg/dL (ref 8.7–10.2)
Chloride: 100 mmol/L (ref 96–106)
Creatinine, Ser: 1 mg/dL (ref 0.57–1.00)
Glucose: 94 mg/dL (ref 70–99)
Potassium: 4 mmol/L (ref 3.5–5.2)
Sodium: 139 mmol/L (ref 134–144)
eGFR: 67 mL/min/{1.73_m2} (ref >59)

## 2021-08-16 NOTE — Progress Notes (Signed)
Kidney function normal

## 2021-10-01 ENCOUNTER — Ambulatory Visit
Admission: RE | Admit: 2021-10-01 | Discharge: 2021-10-01 | Disposition: A | Discharge status: Home / Self Care | Payer: Managed Care, Other (non HMO) | Source: Ambulatory Visit | Attending: Surgery | Admitting: Surgery

## 2021-10-01 ENCOUNTER — Other Ambulatory Visit: Payer: Self-pay

## 2021-10-01 ENCOUNTER — Encounter: Payer: Self-pay | Admitting: Surgery

## 2021-10-01 ENCOUNTER — Ambulatory Visit: Payer: Managed Care, Other (non HMO) | Admitting: Surgery

## 2021-10-01 VITALS — BP 128/83 | HR 58 | Resp 20 | Ht 65.0 in | Wt 218.0 lb

## 2021-10-01 DIAGNOSIS — Z95828 Presence of other vascular implants and grafts: Secondary | ICD-10-CM | POA: Diagnosis not present

## 2021-10-01 DIAGNOSIS — I71019 Dissection of thoracic aorta, unspecified: Secondary | ICD-10-CM

## 2021-10-01 DIAGNOSIS — Z8679 Personal history of other diseases of the circulatory system: Secondary | ICD-10-CM

## 2021-10-01 MED ORDER — IOPAMIDOL (ISOVUE-370) INJECTION 76%
75.0000 mL | Freq: Once | INTRAVENOUS | Status: AC | PRN
  Administered 2021-10-01: 75 mL via INTRAVENOUS

## 2021-10-01 NOTE — Progress Notes (Signed)
? ? ? ?HPI: ? ?The patient is a 56 year old Guerrero who underwent repair of an acute type A aortic dissection with a Hemashield graft and resuspension of the aortic valve by Dr. Servando Snare on 11/03/2014.  She was last seen by him on 09/05/2020 for follow-up and CTA of the chest, abdomen, and pelvis at that time showed a stable chronic aortic dissection with persistent false lumen in the descending thoracic aorta with a maximum diameter of 3.8 cm.  Her last echocardiogram on 01/30/2021 showed an ejection fraction of 60 to 65% with a normal trileaflet aortic valve without regurgitation or stenosis.  Over the past year she has been diagnosed with temporal lobe seizures and is on medication for that.  Overall she still feels well without chest or back pain.  She continues to follow-up with her PCP for management of her hypertension. ? ?Current Outpatient Medications  ?Medication Sig Dispense Refill  ? acetaminophen (TYLENOL) 500 MG tablet Take 500 mg by mouth every 6 (six) hours as needed for mild pain.    ? amLODipine (NORVASC) 10 MG tablet Take 1 tablet (10 mg total) by mouth daily. 90 tablet 0  ? aspirin EC 81 MG tablet Take 1 tablet (81 mg total) by mouth daily. 90 tablet 3  ? carvedilol (COREG) 25 MG tablet Take 1 tablet (25 mg total) by mouth 2 (two) times daily with a meal. 180 tablet 1  ? furosemide (LASIX) 20 MG tablet Take 1 tablet (20 mg total) by mouth as needed for edema. 90 tablet 1  ? hydrochlorothiazide (HYDRODIURIL) 25 MG tablet Take 1 tablet (25 mg total) by mouth daily. 90 tablet 3  ? levETIRAcetam (KEPPRA) 500 MG tablet Take 1 tablet (500 mg total) by mouth 2 (two) times daily. 180 tablet 3  ? lisinopril (ZESTRIL) 40 MG tablet Take 1 tablet (40 mg total) by mouth daily. 90 tablet 0  ? potassium chloride (KLOR-CON) 10 MEQ tablet Take 1 tablet (10 mEq total) by mouth daily. 90 tablet 1  ? simvastatin (ZOCOR) 20 MG tablet Take 1 tablet (20 mg total) by mouth daily. 90 tablet 1  ? ?No current  facility-administered medications for this visit.  ? ? ? ?Physical Exam: ?BP 128/83 (BP Location: Right Arm, Patient Position: Sitting)   Pulse (!) 58   Resp 20   Ht '5\' 5"'$  (1.651 m)   Wt 218 lb (98.9 kg)   LMP 10/29/2014   SpO2 98% Comment: RA  BMI 36.28 kg/m?  ?She looks well. ?Cardiac exam shows a regular rate and rhythm with normal heart sounds.  There is no murmur. ?Lungs are clear. ? ?Diagnostic Tests: ? ?Narrative & Impression  ?CLINICAL DATA:  Aortic aneurysm. ?  ?EXAM: ?CT ANGIOGRAPHY CHEST WITH CONTRAST ?  ?TECHNIQUE: ?Multidetector CT imaging of the chest was performed using the ?standard protocol during bolus administration of intravenous ?contrast. Multiplanar CT image reconstructions and MIPs were ?obtained to evaluate the vascular anatomy. ?  ?RADIATION DOSE REDUCTION: This exam was performed according to the ?departmental dose-optimization program which includes automated ?exposure control, adjustment of the mA and/or kV according to ?patient size and/or use of iterative reconstruction technique. ?  ?CONTRAST:  22m ISOVUE-370 IOPAMIDOL (ISOVUE-370) INJECTION 76% ?  ?COMPARISON:  September 04, 2020. ?  ?FINDINGS: ?Cardiovascular: Status post surgical repair of ascending thoracic ?aorta. Great vessels are widely patent without significant stenosis. ?Stable appearance dissection involving distal portion of descending ?thoracic aorta extending into visualized proximal abdominal aorta. ?Normal cardiac size. No pericardial effusion. ?  ?  Mediastinum/Nodes: No enlarged mediastinal, hilar, or axillary lymph ?nodes. Thyroid gland, trachea, and esophagus demonstrate no ?significant findings. ?  ?Lungs/Pleura: Lungs are clear. No pleural effusion or pneumothorax. ?  ?Upper Abdomen: No acute abnormality. ?  ?Musculoskeletal: Multilevel degenerative changes are noted. No acute ?abnormality is noted. ?  ?Review of the MIP images confirms the above findings. ?  ?IMPRESSION: ?Status post surgical repair of  ascending thoracic aortic aneurysm. ?Stable appearance of dissection involving distal portion of ?descending thoracic aorta which is seen to extend into visualized ?proximal abdominal aorta. ?  ?  ?Electronically Signed ?  By: Marijo Conception M.D. ?  On: 10/01/2021 09:36 ?   ? ? ?Impression: ? ?Overall she continues to do well following surgical repair of her acute type A aortic dissection in 2016.  Her CTA of the chest today shows no change in the appearance of the chronic dissection in the distal descending thoracic aorta extending down into the abdomen.  The ascending aortic repair looks stable.  I reviewed the CTA images with her and answered all of her questions.  I stressed the importance of continued good blood pressure control in preventing further enlargement of her residual aorta and recurrent aortic dissection. ? ?Plan: ? ?She will return to see me in 1 year with a CTA of the chest, abdomen, and pelvis. ? ?I spent 20 minutes performing this established patient evaluation and > 50% of this time was spent face to face counseling and coordinating the care of this patient's chronic aortic dissection.  ? ? ?Gaye Pollack, MD ?[facility] ?[phone] ? ? ? ? ? ?

## 2021-10-27 ENCOUNTER — Other Ambulatory Visit: Payer: Self-pay

## 2021-10-27 ENCOUNTER — Ambulatory Visit: Payer: Self-pay

## 2021-10-27 ENCOUNTER — Telehealth: Payer: Self-pay | Admitting: Cardiovascular Disease

## 2021-10-27 MED ORDER — AMOXICILLIN-POT CLAVULANATE 875-125 MG PO TABS: 1.0000 | ORAL_TABLET | Freq: Two times a day (BID) | ORAL | 0 refills | Status: DC

## 2021-10-27 NOTE — Telephone Encounter (Signed)
Please order augmentin 875 mg twice daily for a week. Please advise to eat yoghurt and maybe take an OTC probiotic to avoid antibiotic related diarrhea. ?

## 2021-10-27 NOTE — Telephone Encounter (Signed)
Spoke with patient to inform her of the following. ?"Please order augmentin 875 mg twice daily for a week. Please advise to eat yoghurt and maybe take an OTC probiotic to avoid antibiotic related diarrhea." ?Patient encouraged to stay hydrated as well. She thanked Dr. Sallyanne Kuster for helping her with this. ?Prescription sent to patient's pharmacy. ?

## 2021-10-27 NOTE — Telephone Encounter (Signed)
?  Chief Complaint: Infected tooth/gum ?Symptoms: Pain swelling - abscess has opened and is draining  ?Frequency: 8 days ?Pertinent Negatives: Patient denies Fever ?Disposition: '[]'$ ED /'[]'$ Urgent Care (no appt availability in office) / '[]'$ Appointment(In office/virtual)/ '[]'$  [place] [place] [facility]/ '[]'$ Home Care/ '[]'$ Refused Recommended Disposition /'[]'$ [place] [facility]/ '[x]'$  Follow-up with PCP ?Additional Notes: Pt has been using salt water rinses and Tylenol. Pt needs to see oral sx for removal. No appts until July. Pt does not have a regular dentist and would like antibiotics to get infection under control.  ? ? ? ?Summary: PAtient requesting antibiotics for tooth infection  ? Patient called in stated that she have an infected tooth with swelling and pain on the side of her face. Patient  is a heart patient and is very concerned but per patient she does not want to go to the ER or UC because of the high bill. She spoke to an oral surgeons but earliest they can get her in is sometime in July. Patient is asking for antibiotics from Dr Redmond Pulling because she have to be at work at 2.30 PM today and not sure what else to do. Patient asking for a call back at Ph# [phone]   ?  ? ?Reason for Disposition ? [1] Face is swollen AND [2] no fever ? ?Answer Assessment - Initial Assessment Questions ?1. LOCATION: "Which tooth is hurting?"  (e.g., right-side/left-side, upper/lower, front/back) ?    Left bottom middle. ?2. ONSET: "When did the toothache start?"  (e.g., hours, days)  ?    8 days ago ?3. SEVERITY: "How bad is the toothache?"  (Scale 1-10; mild, moderate or severe) ?  - MILD (1-3): doesn't interfere with chewing  ?  - MODERATE (4-7): interferes with chewing, interferes with normal activities, awakens from sleep   ?  - SEVERE (8-10): unable to eat, unable to do any normal activities, excruciating pain    ?    7/10 ?4. SWELLING: "Is there any visible swelling of your face?" ?    yes ?5. OTHER SYMPTOMS: "Do you have any  other symptoms?" (e.g., fever) ?    no ?6. PREGNANCY: "Is there any chance you are pregnant?" "When was your last menstrual period?" ?    na ? ?Protocols used: Toothache-A-AH ? ?

## 2021-10-27 NOTE — Telephone Encounter (Signed)
Patient stated she has a tooth infection, doesn't have a dentist, and is waiting on PCP to call her back. She is worried the tooth infection may affect her heart. She missed scheduling her January 100-monthf/u. Scheduled her for May 11 with Dr. CSallyanne Kuster Any advice? ?

## 2021-10-27 NOTE — Telephone Encounter (Signed)
?  Patient is calling because she has a tooth infection and wants to know if she could get an antibiotic because she is afraid it will effect her heart. ?

## 2021-11-07 ENCOUNTER — Ambulatory Visit: Payer: Managed Care, Other (non HMO) | Admitting: Family Medicine

## 2021-11-12 ENCOUNTER — Ambulatory Visit (INDEPENDENT_AMBULATORY_CARE_PROVIDER_SITE_OTHER): Payer: Commercial Managed Care - HMO | Admitting: Family Medicine

## 2021-11-12 ENCOUNTER — Encounter: Payer: Self-pay | Admitting: Family Medicine

## 2021-11-12 ENCOUNTER — Other Ambulatory Visit: Payer: Self-pay | Admitting: *Deleted

## 2021-11-12 ENCOUNTER — Other Ambulatory Visit: Payer: Self-pay | Admitting: Family Medicine

## 2021-11-12 VITALS — BP 123/81 | HR 47 | Temp 98.1°F | Resp 16 | Wt 215.2 lb

## 2021-11-12 DIAGNOSIS — G8929 Other chronic pain: Secondary | ICD-10-CM

## 2021-11-12 DIAGNOSIS — I10 Essential (primary) hypertension: Secondary | ICD-10-CM

## 2021-11-12 DIAGNOSIS — Z95828 Presence of other vascular implants and grafts: Secondary | ICD-10-CM

## 2021-11-12 DIAGNOSIS — F17210 Nicotine dependence, cigarettes, uncomplicated: Secondary | ICD-10-CM | POA: Diagnosis not present

## 2021-11-12 DIAGNOSIS — F17200 Nicotine dependence, unspecified, uncomplicated: Secondary | ICD-10-CM

## 2021-11-12 MED ORDER — CARVEDILOL 25 MG PO TABS: ORAL_TABLET | ORAL | 1 refills | Status: DC

## 2021-11-12 MED ORDER — TRAMADOL HCL 50 MG PO TABS: 50.0000 mg | ORAL_TABLET | Freq: Three times a day (TID) | ORAL | 0 refills | Status: AC | PRN

## 2021-11-12 MED ORDER — POTASSIUM CHLORIDE ER 10 MEQ PO TBCR: 10.0000 meq | EXTENDED_RELEASE_TABLET | Freq: Every day | ORAL | 1 refills | Status: DC

## 2021-11-12 MED ORDER — SIMVASTATIN 20 MG PO TABS: 20.0000 mg | ORAL_TABLET | Freq: Every day | ORAL | 1 refills | Status: DC

## 2021-11-12 MED ORDER — CYCLOBENZAPRINE HCL 5 MG PO TABS: 5.0000 mg | ORAL_TABLET | Freq: Three times a day (TID) | ORAL | 1 refills | Status: DC | PRN

## 2021-11-12 NOTE — Progress Notes (Signed)
Patient is here for follow- up hypertension. Patient said that she is taking a lot of tylenol to keep her pain under control and would like to talk with provider . ?

## 2021-11-13 ENCOUNTER — Encounter: Payer: Self-pay | Admitting: Family Medicine

## 2021-11-13 ENCOUNTER — Ambulatory Visit (INDEPENDENT_AMBULATORY_CARE_PROVIDER_SITE_OTHER): Payer: Commercial Managed Care - HMO | Admitting: Cardiovascular Disease

## 2021-11-13 ENCOUNTER — Encounter: Payer: Self-pay | Admitting: Cardiovascular Disease

## 2021-11-13 VITALS — BP 132/68 | HR 48 | Ht 66.0 in | Wt 215.0 lb

## 2021-11-13 DIAGNOSIS — I10 Essential (primary) hypertension: Secondary | ICD-10-CM | POA: Diagnosis not present

## 2021-11-13 DIAGNOSIS — Z8679 Personal history of other diseases of the circulatory system: Secondary | ICD-10-CM

## 2021-11-13 DIAGNOSIS — Z9889 Other specified postprocedural states: Secondary | ICD-10-CM | POA: Diagnosis not present

## 2021-11-13 DIAGNOSIS — E7800 Pure hypercholesterolemia, unspecified: Secondary | ICD-10-CM

## 2021-11-13 MED ORDER — AMOXICILLIN-POT CLAVULANATE 875-125 MG PO TABS: 1.0000 | ORAL_TABLET | Freq: Two times a day (BID) | ORAL | 0 refills | Status: DC

## 2021-11-13 NOTE — Progress Notes (Signed)
?Cardiology Office Note:   ? ?Date:  11/13/2021  ? ?IDKeria Guerrero, DOB 1966-05-20, MRN [id number] ? ?PCP:  Dorna Mai, MD  ?Cardiologist:  Sanda Klein, MD  ?Electrophysiologist:  None  ? ?Referring MD: Dorna Mai, MD  ? ?Chief Complaint  ?Patient presents with  ? Hypertension  ? ? ?History of Present Illness:   ? ?Ashley Guerrero is a 56 y.o. female with a hx of acute type I aortic dissection in 2016, treated with emergency surgery and placement of a 30 mm Hemashield graft and resuspension of the aortic valve.  Her dissection extended down the descending aorta all the way to the left common iliac artery. ? ?She just had a CT angiogram of the aorta performed in March 2023 showing stable changes.  Echocardiogram July 2022 shows that the resuspended native aortic valve is functioning normally, without stenosis or insufficiency.  She has normal left ventricular systolic and diastolic function, only mild LVH on this most recent study. ? ?Last year she had a couple of seizures.  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 ? ?She has occasional ankle swelling and will take furosemide, roughly twice a week.  Had some gurgling in her chest few weeks ago that she thought might be fluid, but she was not orthopnea.  Does not have exertional dyspnea.  Denies chest pain at rest or with activity.   ? ?She is taking high doses of amlodipine and carvedilol and lisinopril as well as thiazide diuretic.  Unfortunately, she continues to smoke about 10 cigarettes a day.  She is intent on quitting soon. ? ?Has problems with tender, swollen and red gums.  Cannot get to the dentist until July. ? ? ?Past Medical History:  ?Diagnosis Date  ? Aortic dissection ([place])   ? Status post repair 5/16  ? Chronic diastolic CHF (congestive heart failure) ([place])   ? Echo 6/16:  Severe LVH, EF 60-65%, no RWMA, Gr 1 DD, LA upper limits of normal, mild RAE, aneurysmal intra-atrial  septum  ? Fibroid uterus   ? Hypertension   ? ? ?Past Surgical History:  ?Procedure Laterality Date  ? REPLACEMENT ASCENDING AORTA N/A 11/03/2014  ? Procedure: REPLACEMENT ASCENDING AORTA with a 5m hemashield graft,  resuspension of aortic valve, hypothermic circulatory arrest and cardiopulmonary bypass.;  Surgeon: EGrace Isaac MD;  [facility]  Service: Open Heart Surgery;  Laterality: N/A;  ? ? ?Current Medications: ?Current Meds  ?Medication Sig  ? acetaminophen (TYLENOL) 500 MG tablet Take 500 mg by mouth every 6 (six) hours as needed for mild pain.  ? amLODipine (NORVASC) 10 MG tablet Take 1 tablet (10 mg total) by mouth daily.  ? aspirin EC 81 MG tablet Take 1 tablet (81 mg total) by mouth daily.  ? carvedilol (COREG) 25 MG tablet TAKE ONE TABLET BY MOUTH TWICE A DAY WITH A MEAL  ? hydrochlorothiazide (HYDRODIURIL) 25 MG tablet Take 1 tablet (25 mg total) by mouth daily.  ? levETIRAcetam (KEPPRA) 500 MG tablet Take 1 tablet (500 mg total) by mouth 2 (two) times daily.  ? lisinopril (ZESTRIL) 40 MG tablet Take 1 tablet (40 mg total) by mouth daily.  ? potassium chloride (KLOR-CON) 10 MEQ tablet Take 1 tablet (10 mEq total) by mouth daily.  ? simvastatin (ZOCOR) 20 MG tablet Take 1 tablet (20 mg total) by mouth daily.  ?  ? ?Allergies:   Influenza vaccines  ? ?Social History  ? ?  Socioeconomic History  ? Marital status: Single  ?  Spouse name: Not on file  ? Number of children: 0  ? Years of education: Not on file  ? Highest education level: Some college, no degree  ?Occupational History  ? Not on file  ?Tobacco Use  ? Smoking status: Some Days  ?  Packs/day: 1.00  ?  Types: Cigarettes  ?  Start date: 11/03/2014  ? Smokeless tobacco: Never  ?Vaping Use  ? Vaping Use: Never used  ?Substance and Sexual Activity  ? Alcohol use: Not Currently  ?  Alcohol/week: 0.0 standard drinks  ?  Comment: occasional  ? Drug use: Not Currently  ?  Types: Marijuana  ? Sexual activity: Not Currently  ?Other Topics Concern   ? Not on file  ?Social History Narrative  ? Right handed   ? ?Social Determinants of Health  ? ?Financial Resource Strain: Not on file  ?Food Insecurity: Not on file  ?Transportation Needs: Not on file  ?Physical Activity: Not on file  ?Stress: Not on file  ?Social Connections: Not on file  ?  ? ?Family History: ?The patient's family history includes Brain cancer in her mother; Breast cancer in her maternal aunt, maternal grandmother, mother, paternal aunt, and paternal grandmother; Cancer in her sister; Diabetes in her father; Heart failure in her father; Hyperlipidemia in her father; Hypertension in her brother, father, mother, and sister; Stroke in her father. There is no history of Heart attack. ? ?ROS:   ?Please see the history of present illness.    ? All other systems reviewed and are negative. ? ?EKGs/Labs/Other Studies Reviewed:   ? ?The following studies were reviewed today: ? ?Echocardiogram 01/30/2021  ? 1. Left ventricular ejection fraction, by estimation, is 60 to 65%. The  ?left ventricle has normal function. The left ventricle has no regional  ?wall motion abnormalities. There is mild concentric left ventricular  ?hypertrophy. Left ventricular diastolic  ?parameters were normal.  ? 2. Right ventricular systolic function is normal. The right ventricular  ?size is normal. Tricuspid regurgitation signal is inadequate for assessing  ?PA pressure.  ? 3. The mitral valve is grossly normal. No evidence of mitral valve  ?regurgitation. No evidence of mitral stenosis.  ? 4. The aortic valve is tricuspid. Aortic valve regurgitation is not  ?visualized. No aortic stenosis is present.  ? 5. S/p ascending aortic repair. Normal measurements of the aorta are  ?noted. Aortic root/ascending aorta has been repaired/replaced.  ? 6. The inferior vena cava is normal in size with greater than 50%  ?respiratory variability, suggesting right atrial pressure of 3 mmHg.  ? ?CT angiogram of the chest  10/01/2021 ?PRESSION: ?Status post surgical repair of ascending thoracic aortic aneurysm. ?Stable appearance of dissection involving distal portion of ?descending thoracic aorta which is seen to extend into visualized ?proximal abdominal aorta. ?  ? ?CT angiogram of the chest 04/10/2019 ?IMPRESSION: ?1. Surgical changes at the level of the ascending aorta, compatible ?with the given history of previous dissection repair. Mild residual ?aneurysmal dilatation of the ascending thoracic aorta measuring 3.5 ?cm diameter, decreased compared to the aneurysmal dilatation of 4.2 cm seen on the pre-surgical chest CT angiogram of 11/03/2014. No residual dissection within the ascending thoracic aorta or aortic ?arch. ?2. Expected evolution of the chronic aortic dissection within the ?descending thoracic aorta extending into the upper portion of the ?abdominal aorta (incompletely imaged), with commensurate aneurysm measuring 3.8 cm diameter, with patency of both the true and false lumens. ?  3. No acute findings.  Lungs are clear. ? ?EKG:  EKG is  ordered today.  Similar to previous tracings.  She has mild sinus bradycardia, possible left atrial abnormality, LVH with voltage mass by obesity and T wave inversion in the lateral leads.  QTc 450 ms. ?Recent Labs: ?01/01/2021: Magnesium 2.4 ?03/17/2021: ALT 17; Hemoglobin 13.7; Platelets 287 ?08/15/2021: BUN 17; Creatinine, Ser 1.00; Potassium 4.0; Sodium 139  ?Recent Lipid Panel ?   ?Component Value Date/Time  ? CHOL 162 01/30/2021 0313  ? CHOL 161 03/23/2019 0945  ? TRIG 115 01/30/2021 0313  ? HDL 42 01/30/2021 0313  ? HDL 51 03/23/2019 0945  ? CHOLHDL 3.9 01/30/2021 0313  ? VLDL 23 01/30/2021 0313  ? [place] 97 01/30/2021 0313  ? [place] 91 03/23/2019 0945  ? LDLDIRECT 99 05/09/2020 1357  ? ? ?Physical Exam:   ? ?VS:  BP 132/68 (BP Location: Left Arm, Patient Position: Sitting, Cuff Size: Large)   Pulse (!) 48   Ht '5\' 6"'$  (1.676 m)   Wt 215 lb (97.5 kg)   LMP 10/29/2014   SpO2 94%    BMI 34.70 kg/m?    ? ?Wt Readings from Last 3 Encounters:  ?11/13/21 215 lb (97.5 kg)  ?11/12/21 215 lb 3.2 oz (97.6 kg)  ?10/01/21 218 lb (98.9 kg)  ?  ? ? ?General: Alert, oriented x3, no distress, moderately obese.  [place]

## 2021-11-13 NOTE — Progress Notes (Signed)
? ?Established Patient Office Visit ? ?Subjective   ? ?Patient ID: Ashley Guerrero, female    DOB: 11-29-1965  Age: 56 y.o. MRN: [id number] ? ?CC:  ?Chief Complaint  ?Patient presents with  ? Follow-up  ? Hypertension  ? ? ?HPI ?[employer] presents for follow up of hypertension. She also reports that she has had chronic pain for which she takes up to 8 tylenol a day.  ? ? ?Outpatient Encounter Medications as of 11/12/2021  ?Medication Sig  ? acetaminophen (TYLENOL) 500 MG tablet Take 500 mg by mouth every 6 (six) hours as needed for mild pain.  ? amLODipine (NORVASC) 10 MG tablet Take 1 tablet (10 mg total) by mouth daily.  ? aspirin EC 81 MG tablet Take 1 tablet (81 mg total) by mouth daily.  ? cyclobenzaprine (FLEXERIL) 5 MG tablet Take 1 tablet (5 mg total) by mouth 3 (three) times daily as needed for muscle spasms.  ? furosemide (LASIX) 20 MG tablet Take 1 tablet (20 mg total) by mouth as needed for edema.  ? hydrochlorothiazide (HYDRODIURIL) 25 MG tablet Take 1 tablet (25 mg total) by mouth daily.  ? levETIRAcetam (KEPPRA) 500 MG tablet Take 1 tablet (500 mg total) by mouth 2 (two) times daily.  ? lisinopril (ZESTRIL) 40 MG tablet Take 1 tablet (40 mg total) by mouth daily.  ? traMADol (ULTRAM) 50 MG tablet Take 1 tablet (50 mg total) by mouth every 8 (eight) hours as needed for up to 5 days.  ? [DISCONTINUED] carvedilol (COREG) 25 MG tablet Take 1 tablet (25 mg total) by mouth 2 (two) times daily with a meal.  ? [DISCONTINUED] potassium chloride (KLOR-CON) 10 MEQ tablet Take 1 tablet (10 mEq total) by mouth daily.  ? [DISCONTINUED] simvastatin (ZOCOR) 20 MG tablet Take 1 tablet (20 mg total) by mouth daily.  ? amoxicillin-clavulanate (AUGMENTIN) 875-125 MG tablet Take 1 tablet by mouth 2 (two) times daily. (Patient not taking: Reported on 11/12/2021)  ? ?No facility-administered encounter medications on file as of 11/12/2021.  ? ? ?Past Medical History:  ?Diagnosis Date  ? Aortic dissection ([place])   ? Status  post repair 5/16  ? Chronic diastolic CHF (congestive heart failure) ([place])   ? Echo 6/16:  Severe LVH, EF 60-65%, no RWMA, Gr 1 DD, LA upper limits of normal, mild RAE, aneurysmal intra-atrial septum  ? Fibroid uterus   ? Hypertension   ? ? ?Past Surgical History:  ?Procedure Laterality Date  ? REPLACEMENT ASCENDING AORTA N/A 11/03/2014  ? Procedure: REPLACEMENT ASCENDING AORTA with a 46m hemashield graft,  resuspension of aortic valve, hypothermic circulatory arrest and cardiopulmonary bypass.;  Surgeon: EGrace Isaac MD;  [place] [facility]  Service: Open Heart Surgery;  Laterality: N/A;  ? ? ?Family History  ?Problem Relation Age of Onset  ? Hypertension Mother   ? Breast cancer Mother   ? Brain cancer Mother   ? Stroke Father   ? Heart failure Father   ? Hypertension Father   ? Diabetes Father   ? Hyperlipidemia Father   ? Hypertension Sister   ? Hypertension Brother   ? Cancer Sister   ? Breast cancer Maternal Aunt   ? Breast cancer Paternal Aunt   ? Breast cancer Maternal Grandmother   ? Breast cancer Paternal Grandmother   ? Heart attack Neg Hx   ? ? ?Social History  ? ?Socioeconomic History  ? Marital status: Single  ?  Spouse name: Not on file  ? Number of  children: 0  ? Years of education: Not on file  ? Highest education level: Some college, no degree  ?Occupational History  ? Not on file  ?Tobacco Use  ? Smoking status: Some Days  ?  Packs/day: 1.00  ?  Types: Cigarettes  ?  Start date: 11/03/2014  ? Smokeless tobacco: Never  ?Vaping Use  ? Vaping Use: Never used  ?Substance and Sexual Activity  ? Alcohol use: Not Currently  ?  Alcohol/week: 0.0 standard drinks  ?  Comment: occasional  ? Drug use: Not Currently  ?  Types: Marijuana  ? Sexual activity: Not Currently  ?Other Topics Concern  ? Not on file  ?Social History Narrative  ? Right handed   ? ?Social Determinants of Health  ? ?Financial Resource Strain: Not on file  ?Food Insecurity: Not on file  ?Transportation Needs: Not on file  ?Physical  Activity: Not on file  ?Stress: Not on file  ?Social Connections: Not on file  ?Intimate Partner Violence: Not on file  ? ? ?Review of Systems  ?All other systems reviewed and are negative. ? ?  ? ? ?Objective   ? ?BP 123/81   Pulse (!) 47   Temp 98.1 ?F (36.7 ?C) (Oral)   Resp 16   Wt 215 lb 3.2 oz (97.6 kg)   LMP 10/29/2014   SpO2 91%   BMI 35.81 kg/m?  ? ?Physical Exam ?Vitals and nursing note reviewed.  ?Constitutional:   ?   General: She is not in acute distress. ?Cardiovascular:  ?   Rate and Rhythm: Normal rate and regular rhythm.  ?Pulmonary:  ?   Effort: Pulmonary effort is normal.  ?   Breath sounds: Normal breath sounds.  ?Abdominal:  ?   Palpations: Abdomen is soft.  ?   Tenderness: There is no abdominal tenderness.  ?Musculoskeletal:  ?   Right lower leg: No edema.  ?   Left lower leg: No edema.  ?Neurological:  ?   General: No focal deficit present.  ?   Mental Status: She is alert and oriented to person, place, and time.  ? ? ? ?  ? ?Assessment & Plan:  ? ?1. Essential hypertension ?Appears stable with present management. Continue and monitor ? ?2. Other chronic pain ?Patient recommended to keep tylenol consumption under '3000mg'$  daily. Flexeril and tramadol were prescribed.  ? ?3. Tobacco use disorder ? ? ? ? ?Return in about 4 weeks (around 12/10/2021) for follow up.  ? ?Becky Sax, MD ? ? ?

## 2021-11-13 NOTE — Patient Instructions (Signed)
Medication Instructions:  ?Augmentin: One tablet twice daily for 10 days ? ?*If you need a refill on your cardiac medications before your next appointment, please call your pharmacy* ? ? ?Lab Work: ?None ordered ?If you have labs (blood work) drawn today and your tests are completely normal, you will receive your results only by: ?MyChart Message (if you have MyChart) OR ?A paper copy in the mail ?If you have any lab test that is abnormal or we need to change your treatment, we will call you to review the results. ? ? ?Testing/Procedures: ?None ordered ? ? ?Follow-Up: ?At [facility], you and your health needs are our priority.  As part of our continuing mission to provide you with exceptional heart care, we have created designated Provider Care Teams.  These Care Teams include your primary Cardiologist (physician) and Advanced Practice Providers (APPs -  Physician Assistants and Nurse Practitioners) who all work together to provide you with the care you need, when you need it. ? ?We recommend signing up for the patient portal called "MyChart".  Sign up information is provided on this After Visit Summary.  MyChart is used to connect with patients for Virtual Visits (Telemedicine).  Patients are able to view lab/test results, encounter notes, upcoming appointments, etc.  Non-urgent messages can be sent to your provider as well.   ?To learn more about what you can do with MyChart, go to [email or web address].   ? ?Your next appointment:   ?12 month(s) ? ?The format for your next appointment:   ?In Person ? ?Provider:   ?Sanda Klein, MD { ? ? ?Important Information About Sugar ? ? ? ? ? ? ?

## 2021-12-05 ENCOUNTER — Encounter: Payer: Self-pay | Admitting: Family Medicine

## 2021-12-05 ENCOUNTER — Ambulatory Visit (INDEPENDENT_AMBULATORY_CARE_PROVIDER_SITE_OTHER): Payer: Commercial Managed Care - HMO | Admitting: Family Medicine

## 2021-12-05 VITALS — BP 134/70 | HR 59 | Temp 98.1°F | Resp 16 | Wt 213.0 lb

## 2021-12-05 DIAGNOSIS — F17210 Nicotine dependence, cigarettes, uncomplicated: Secondary | ICD-10-CM

## 2021-12-05 DIAGNOSIS — I10 Essential (primary) hypertension: Secondary | ICD-10-CM

## 2021-12-05 DIAGNOSIS — G8929 Other chronic pain: Secondary | ICD-10-CM

## 2021-12-05 DIAGNOSIS — E782 Mixed hyperlipidemia: Secondary | ICD-10-CM

## 2021-12-05 DIAGNOSIS — F17200 Nicotine dependence, unspecified, uncomplicated: Secondary | ICD-10-CM

## 2021-12-05 NOTE — Progress Notes (Signed)
Patient is here for follow-up hypertension Vitals:   12/05/21 0816  BP: 134/70

## 2021-12-05 NOTE — Progress Notes (Signed)
Established Patient Office Visit  Subjective    Patient ID: Ashley Guerrero, female    DOB: 12-12-1965  Age: 56 y.o. MRN: [id number]  CC:  Chief Complaint  Patient presents with   Follow-up    HPI [facility] presents for routine follow up of chronic med issues. She denies acute complaints or concerns.   Outpatient Encounter Medications as of 12/05/2021  Medication Sig   acetaminophen (TYLENOL) 500 MG tablet Take 500 mg by mouth every 6 (six) hours as needed for mild pain.   amLODipine (NORVASC) 10 MG tablet Take 1 tablet (10 mg total) by mouth daily.   amoxicillin-clavulanate (AUGMENTIN) 875-125 MG tablet Take 1 tablet by mouth 2 (two) times daily.   aspirin EC 81 MG tablet Take 1 tablet (81 mg total) by mouth daily.   carvedilol (COREG) 25 MG tablet TAKE ONE TABLET BY MOUTH TWICE A DAY WITH A MEAL   cyclobenzaprine (FLEXERIL) 5 MG tablet Take 1 tablet (5 mg total) by mouth 3 (three) times daily as needed for muscle spasms.   furosemide (LASIX) 20 MG tablet Take 1 tablet (20 mg total) by mouth as needed for edema.   hydrochlorothiazide (HYDRODIURIL) 25 MG tablet Take 1 tablet (25 mg total) by mouth daily.   levETIRAcetam (KEPPRA) 500 MG tablet Take 1 tablet (500 mg total) by mouth 2 (two) times daily.   lisinopril (ZESTRIL) 40 MG tablet Take 1 tablet (40 mg total) by mouth daily.   potassium chloride (KLOR-CON) 10 MEQ tablet Take 1 tablet (10 mEq total) by mouth daily.   simvastatin (ZOCOR) 20 MG tablet Take 1 tablet (20 mg total) by mouth daily.   No facility-administered encounter medications on file as of 12/05/2021.    Past Medical History:  Diagnosis Date   Aortic dissection ([facility])    Status post repair 5/16   Chronic diastolic CHF (congestive heart failure) ([place])    Echo 6/16:  Severe LVH, EF 60-65%, no RWMA, Gr 1 DD, LA upper limits of normal, mild RAE, aneurysmal intra-atrial septum   Fibroid uterus    Hypertension     Past Surgical History:  Procedure  Laterality Date   REPLACEMENT ASCENDING AORTA N/A 11/03/2014   Procedure: REPLACEMENT ASCENDING AORTA with a 76m hemashield graft,  resuspension of aortic valve, hypothermic circulatory arrest and cardiopulmonary bypass.;  Surgeon: EGrace Isaac MD;  [place] [facility]  Service: Open Heart Surgery;  Laterality: N/A;    Family History  Problem Relation Age of Onset   Hypertension Mother    Breast cancer Mother    Brain cancer Mother    Stroke Father    Heart failure Father    Hypertension Father    Diabetes Father    Hyperlipidemia Father    Hypertension Sister    Hypertension Brother    Cancer Sister    Breast cancer Maternal Aunt    Breast cancer Paternal Aunt    Breast cancer Maternal Grandmother    Breast cancer Paternal Grandmother    Heart attack Neg Hx     Social History   Socioeconomic History   Marital status: Single    Spouse name: Not on file   Number of children: 0   Years of education: Not on file   Highest education level: Some college, no degree  Occupational History   Not on file  Tobacco Use   Smoking status: Some Days    Packs/day: 1.00    Types: Cigarettes    Start date: 11/03/2014  Smokeless tobacco: Never  Vaping Use   Vaping Use: Never used  Substance and Sexual Activity   Alcohol use: Not Currently    Alcohol/week: 0.0 standard drinks    Comment: occasional   Drug use: Not Currently    Types: Marijuana   Sexual activity: Not Currently  Other Topics Concern   Not on file  Social History Narrative   Right handed    Social Determinants of Health   Financial Resource Strain: Not on file  Food Insecurity: Not on file  Transportation Needs: Not on file  Physical Activity: Not on file  Stress: Not on file  Social Connections: Not on file  Intimate Partner Violence: Not on file    Review of Systems  All other systems reviewed and are negative.      Objective    BP 134/70   Pulse (!) 59   Temp 98.1 F (36.7 C) (Oral)   Resp  16   Wt 213 lb (96.6 kg)   LMP 10/29/2014   SpO2 94%   BMI 34.38 kg/m   Physical Exam Vitals and nursing note reviewed.  Constitutional:      General: She is not in acute distress. Cardiovascular:     Rate and Rhythm: Normal rate and regular rhythm.  Pulmonary:     Effort: Pulmonary effort is normal.     Breath sounds: Normal breath sounds.  Abdominal:     Palpations: Abdomen is soft.     Tenderness: There is no abdominal tenderness.  Musculoskeletal:     Right lower leg: No edema.     Left lower leg: No edema.  Neurological:     General: No focal deficit present.     Mental Status: She is alert and oriented to person, place, and time.        Assessment & Plan:   1. Essential hypertension Appears stable with present management. Continue. Meds refilled.   2. Tobacco use disorder Discussed reduction/cessation  3. Mixed hyperlipidemia Continue present management  4. Other chronic pain Patient reports much improvement with present management. Continue (flexeril and tylenol prn)    No follow-ups on file.   Becky Sax, MD

## 2021-12-10 ENCOUNTER — Ambulatory Visit: Payer: Commercial Managed Care - HMO | Admitting: Family Medicine

## 2021-12-25 ENCOUNTER — Telehealth: Payer: Self-pay | Admitting: Family Medicine

## 2021-12-25 NOTE — Telephone Encounter (Signed)
Patient was referred by PCP to the oral surgeon [facility] [place] phone number [phone]  for impacted tooth.   Dr. Buelah Manis (oral surgeon) advised patient she has to see a Dentist to refer her back to Dr. Buelah Manis office for clearance for surgery. Patient states Dr. Buelah Manis gave her a list of Dentist and she choose to go with Dr Enzo Bi [place] ([place]) phone number [phone]. Dr. Sabino Niemann is requesting patient to contact her cardiology and have them send over office notes to prior to Dr. Enzo Bi doing any type of imaging or work up.  .  Patient would like PCP nurse to contact Dr. Enzo Bi [phone] office and find out what exactly they need.   Patient is in discomfort and now is on her 2nd round of antibiotics and "its starting to swell up again". Patient will contact cardiology but would like PCP to intervene because she states its a process.      Please reach to patient as soon as you can for a follow up.  Patient will reach out to cardiology as well.

## 2021-12-26 ENCOUNTER — Telehealth: Payer: Self-pay | Admitting: *Deleted

## 2021-12-26 NOTE — Telephone Encounter (Signed)
Provider has paperwork

## 2021-12-26 NOTE — Telephone Encounter (Signed)
Dental office closed. Need clarification on fax received. We will need to know what the procedure is to be done, what type of anesthesia, if any meds needs to be held, doctor name. We will not provide a blanket type clearance. Please re-fax a clearance with the requested information.   IMPORTANT: if the pt is having any teeth extracted; please let us know HOW MANY teeth, please not the tooth location in the mouth, also if simple or surgical extractions.

## 2021-12-29 NOTE — Telephone Encounter (Signed)
Left very detailed message for the [facility] office, please see previous notes.  Cannot proceed until we have a clearance giving clarification to the procedure to be done at this time

## 2021-12-30 NOTE — Telephone Encounter (Signed)
   Patient Name: Ashley Guerrero  DOB: [birth year] MRN: [id number]  Primary Cardiologist: Thurmon Fair, MD  Chart reviewed as part of pre-operative protocol coverage.   Dental office is unable to provide [place] with specific information on procedure.  IF SIMPLE EXTRACTION/CLEANINGS: Simple dental extractions (i.e. 1-2 teeth) are considered low risk procedures per guidelines and generally do not require any specific cardiac clearance. It is also generally accepted that for simple extractions and dental cleanings, there is no need to interrupt blood thinner therapy.  Patient would continue aspirin.  IF MULTIPLE EXTRACTIONS OR COMPLEX DENTAL PROCEDURES: Per Dr. Royann Shivers can stop aspirin prior to procedure if necessary.    SBE prophylaxis is not required for the patient from a cardiac standpoint.  I will route this recommendation to the requesting party via Epic fax function and remove from pre-op pool.  Please call with questions.  Sharlene Dory, PA-C 12/30/2021, 2:13 PM

## 2021-12-30 NOTE — Telephone Encounter (Signed)
Since there is no prosthetic material involved, I do not think she requires endocarditis prophylaxis.  Okay to stop aspirin if multiple tooth extraction is necessary.

## 2021-12-30 NOTE — Telephone Encounter (Signed)
   Pre-operative Risk Assessment    Patient Name: Ashley Guerrero  DOB: 18-Dec-1965 MRN: [id number]    Received fax today from D[facility] office. See previous notes form 12/29/21 as well. Notes on fax today states:  "Good morning. I wanted to follow-up and provide clarification about medical clearance. Ashley Guerrero is being seen as a new patient. This initial appointment is so that she can receive a referral to the oral surgeon. We do not know what teeth will be extracted because this is just a comprehensive exam. We've received clearance from her PCP, just need medical clearance for dental services by her heart doctor."   Request for Surgical Clearance    Procedure:   NEW PT; JUST A COMPREHENSIVE EXAM; SEE PHONE NOTE FOR FURTHER INFO   Date of Surgery:  Clearance TBD                                 Surgeon:  NOT LISTED Surgeon's Group or Practice Name:  [facility]  Phone number:  [phone]  Fax number:  [phone]   Type of Clearance Requested:   - Medical ; NO MEDICATIONS LISTED AS NEEDING TO BE HELD   Type of Anesthesia:  Not Indicated   Additional requests/questions:    Ashley Guerrero   12/30/2021, 11:29 AM

## 2022-01-05 ENCOUNTER — Other Ambulatory Visit: Payer: Self-pay

## 2022-01-05 DIAGNOSIS — I10 Essential (primary) hypertension: Secondary | ICD-10-CM

## 2022-01-05 MED ORDER — AMLODIPINE BESYLATE 10 MG PO TABS: 10.0000 mg | ORAL_TABLET | Freq: Every day | ORAL | 0 refills | Status: DC

## 2022-01-05 MED ORDER — LISINOPRIL 40 MG PO TABS: 40.0000 mg | ORAL_TABLET | Freq: Every day | ORAL | 0 refills | Status: DC

## 2022-01-22 ENCOUNTER — Ambulatory Visit (INDEPENDENT_AMBULATORY_CARE_PROVIDER_SITE_OTHER): Payer: Commercial Managed Care - HMO | Admitting: Family Medicine

## 2022-01-22 ENCOUNTER — Encounter: Payer: Self-pay | Admitting: Family Medicine

## 2022-01-22 VITALS — BP 121/74 | HR 64 | Temp 98.3°F | Resp 16 | Wt 210.0 lb

## 2022-01-22 DIAGNOSIS — Z01818 Encounter for other preprocedural examination: Secondary | ICD-10-CM | POA: Diagnosis not present

## 2022-01-22 DIAGNOSIS — Z0289 Encounter for other administrative examinations: Secondary | ICD-10-CM | POA: Diagnosis not present

## 2022-01-22 MED ORDER — AMOXICILLIN 500 MG PO CAPS: ORAL_CAPSULE | ORAL | 0 refills | Status: DC

## 2022-01-22 NOTE — Progress Notes (Signed)
Established Patient Office Visit  Subjective    Patient ID: Seraiah Nowack, female    DOB: 1966-06-14  Age: 56 y.o. MRN: [id number]  CC:  Chief Complaint  Patient presents with   Form Completion    HPI [facility] presents for pre-op medical clearance for upcoming dental procedures.    Outpatient Encounter Medications as of 01/22/2022  Medication Sig   amoxicillin (AMOXIL) 500 MG capsule As one time dose before procedure   acetaminophen (TYLENOL) 500 MG tablet Take 500 mg by mouth every 6 (six) hours as needed for mild pain.   amLODipine (NORVASC) 10 MG tablet Take 1 tablet (10 mg total) by mouth daily.   amoxicillin-clavulanate (AUGMENTIN) 875-125 MG tablet Take 1 tablet by mouth 2 (two) times daily.   aspirin EC 81 MG tablet Take 1 tablet (81 mg total) by mouth daily.   carvedilol (COREG) 25 MG tablet TAKE ONE TABLET BY MOUTH TWICE A DAY WITH A MEAL   cyclobenzaprine (FLEXERIL) 5 MG tablet Take 1 tablet (5 mg total) by mouth 3 (three) times daily as needed for muscle spasms.   furosemide (LASIX) 20 MG tablet Take 1 tablet (20 mg total) by mouth as needed for edema.   hydrochlorothiazide (HYDRODIURIL) 25 MG tablet Take 1 tablet (25 mg total) by mouth daily.   levETIRAcetam (KEPPRA) 500 MG tablet Take 1 tablet (500 mg total) by mouth 2 (two) times daily.   lisinopril (ZESTRIL) 40 MG tablet Take 1 tablet (40 mg total) by mouth daily.   potassium chloride (KLOR-CON) 10 MEQ tablet Take 1 tablet (10 mEq total) by mouth daily.   simvastatin (ZOCOR) 20 MG tablet Take 1 tablet (20 mg total) by mouth daily.   No facility-administered encounter medications on file as of 01/22/2022.    Past Medical History:  Diagnosis Date   Aortic dissection (HCC)    Status post repair 5/16   Chronic diastolic CHF (congestive heart failure) ([place])    Echo 6/16:  Severe LVH, EF 60-65%, no RWMA, Gr 1 DD, LA upper limits of normal, mild RAE, aneurysmal intra-atrial septum   Fibroid uterus     Hypertension     Past Surgical History:  Procedure Laterality Date   REPLACEMENT ASCENDING AORTA N/A 11/03/2014   Procedure: REPLACEMENT ASCENDING AORTA with a 75m hemashield graft,  resuspension of aortic valve, hypothermic circulatory arrest and cardiopulmonary bypass.;  Surgeon: EGrace Isaac MD;  [facility]  Service: Open Heart Surgery;  Laterality: N/A;    Family History  Problem Relation Age of Onset   Hypertension Mother    Breast cancer Mother    Brain cancer Mother    Stroke Father    Heart failure Father    Hypertension Father    Diabetes Father    Hyperlipidemia Father    Hypertension Sister    Hypertension Brother    Cancer Sister    Breast cancer Maternal Aunt    Breast cancer Paternal Aunt    Breast cancer Maternal Grandmother    Breast cancer Paternal Grandmother    Heart attack Neg Hx     Social History   Socioeconomic History   Marital status: Single    Spouse name: Not on file   Number of children: 0   Years of education: Not on file   Highest education level: Some college, no degree  Occupational History   Not on file  Tobacco Use   Smoking status: Some Days    Packs/day: 1.00  Types: Cigarettes    Start date: 11/03/2014   Smokeless tobacco: Never  Vaping Use   Vaping Use: Never used  Substance and Sexual Activity   Alcohol use: Not Currently    Alcohol/week: 0.0 standard drinks of alcohol    Comment: occasional   Drug use: Not Currently    Types: Marijuana   Sexual activity: Not Currently  Other Topics Concern   Not on file  Social History Narrative   Right handed    Social Determinants of Health   Financial Resource Strain: Not on file  Food Insecurity: Not on file  Transportation Needs: No Transportation Needs (03/02/2019)   PRAPARE - Transportation    Lack of Transportation (Medical): No    Lack of Transportation (Non-Medical): No  Physical Activity: Not on file  Stress: Not on file  Social Connections: Not on file   Intimate Partner Violence: Not on file    Review of Systems  All other systems reviewed and are negative.       Objective    BP 121/74 (BP Location: Right Arm, Patient Position: Sitting, Cuff Size: Large)   Pulse 64   Temp 98.3 F (36.8 C)   Resp 16   Wt 210 lb (95.3 kg)   LMP 10/29/2014   SpO2 96%   BMI 33.89 kg/m   Physical Exam Vitals and nursing note reviewed.  Constitutional:      General: She is not in acute distress. HENT:     Head: Normocephalic and atraumatic.  Cardiovascular:     Rate and Rhythm: Normal rate and regular rhythm.  Pulmonary:     Effort: Pulmonary effort is normal.     Breath sounds: Normal breath sounds.  Abdominal:     Palpations: Abdomen is soft.     Tenderness: There is no abdominal tenderness.  Musculoskeletal:     Right lower leg: No edema.     Left lower leg: No edema.  Neurological:     General: No focal deficit present.     Mental Status: She is alert and oriented to person, place, and time.         Assessment & Plan:   1. Preop examination Patient appears medically stable to undergo the stated procedure. Antibiotic prophylaxis recommended and meds ordered.   2. Encounter for completion of form with patient Form completed as above    No follow-ups on file.   Becky Sax, MD

## 2022-01-22 NOTE — Progress Notes (Signed)
Pt presents for completion of dental clearance form

## 2022-01-29 ENCOUNTER — Telehealth: Payer: Self-pay | Admitting: *Deleted

## 2022-01-29 NOTE — Telephone Encounter (Signed)
   Pre-operative Risk Assessment    Patient Name: Ashley Guerrero  DOB: 31-Aug-1965 MRN: [id number]     Request for Surgical Clearance    Procedure:  Dental Extraction - Amount of Teeth to be Pulled:  1 extraction with alveolopasty in all 4 quadrants   Date of Surgery:  Clearance TBD                                 Surgeon:  unable to read Surgeon's Group or Practice Name:  [facility] Phone number:  [phone] Fax number:  [phone]   Type of Clearance Requested:   - Medical    Type of Anesthesia:   IV with versed and fentanyl   Additional requests/questions:  Does this patient need antibiotics?  Signed, Fredia Beets   01/29/2022, 2:42 PM

## 2022-01-30 ENCOUNTER — Telehealth: Payer: Self-pay | Admitting: Cardiovascular Disease

## 2022-01-30 NOTE — Telephone Encounter (Signed)
Pt returning nurses call regarding Pre OP appt. Please advise

## 2022-01-30 NOTE — Telephone Encounter (Signed)
Called to arrange tele visit for pt's upcoming procedure.  Pt states she was just here seeing Dr. Sallyanne Kuster in May and she shouldn't need a phone visit, that a phone visit isn't going to do any good.  Advised pt I would send her concerns to the preop team.

## 2022-01-30 NOTE — Telephone Encounter (Signed)
Pt has been scheduled for tele visit 02/05/22 9:20, consent on file / medications reconciled.    Patient Consent for Virtual Visit        Ashley Guerrero has provided verbal consent on 01/30/2022 for a virtual visit (video or telephone).   CONSENT FOR VIRTUAL VISIT FOR:  Ashley Guerrero  By participating in this virtual visit I agree to the following:  I hereby voluntarily request, consent and authorize [facility] and its employed or contracted physicians, Engineer, materials, nurse practitioners or other licensed health care professionals (the Practitioner), to provide me with telemedicine health care services (the "Services") as deemed necessary by the treating Practitioner. I acknowledge and consent to receive the Services by the Practitioner via telemedicine. I understand that the telemedicine visit will involve communicating with the Practitioner through live audiovisual communication technology and the disclosure of certain medical information by electronic transmission. I acknowledge that I have been given the opportunity to request an in-person assessment or other available alternative prior to the telemedicine visit and am voluntarily participating in the telemedicine visit.  I understand that I have the right to withhold or withdraw my consent to the use of telemedicine in the course of my care at any time, without affecting my right to future care or treatment, and that the Practitioner or I may terminate the telemedicine visit at any time. I understand that I have the right to inspect all information obtained and/or recorded in the course of the telemedicine visit and may receive copies of available information for a reasonable fee.  I understand that some of the potential risks of receiving the Services via telemedicine include:  Delay or interruption in medical evaluation due to technological equipment failure or disruption; Information transmitted may not be sufficient (e.g. poor  resolution of images) to allow for appropriate medical decision making by the Practitioner; and/or  In rare instances, security protocols could fail, causing a breach of personal health information.  Furthermore, I acknowledge that it is my responsibility to provide information about my medical history, conditions and care that is complete and accurate to the best of my ability. I acknowledge that Practitioner's advice, recommendations, and/or decision may be based on factors not within their control, such as incomplete or inaccurate data provided by me or distortions of diagnostic images or specimens that may result from electronic transmissions. I understand that the practice of medicine is not an exact science and that Practitioner makes no warranties or guarantees regarding treatment outcomes. I acknowledge that a copy of this consent can be made available to me via my patient portal ([place] [facility]), or I can request a printed copy by calling the office of [facility].    I understand that my insurance will be billed for this visit.   I have read or had this consent read to me. I understand the contents of this consent, which adequately explains the benefits and risks of the Services being provided via telemedicine.  I have been provided ample opportunity to ask questions regarding this consent and the Services and have had my questions answered to my satisfaction. I give my informed consent for the services to be provided through the use of telemedicine in my medical care

## 2022-01-30 NOTE — Telephone Encounter (Signed)
   Name: Ashley Guerrero  DOB: [birth year]  MRN: [id number]  Primary Cardiologist: Sanda Klein, MD   Preoperative team, please contact this patient and set up a phone call appointment for further preoperative risk assessment. Please obtain consent and complete medication review. Thank you for your help.  I confirm that guidance regarding antiplatelet and oral anticoagulation therapy has been completed and, if necessary, noted below (none requested).  SBE prophylaxis is required for this patient.   [name], NP 01/30/2022, 11:23 AM [place] [address] [place],  [address]

## 2022-01-30 NOTE — Telephone Encounter (Signed)
Pt called back and she has been scheduled for a tele visit 02/05/2022.

## 2022-02-05 ENCOUNTER — Ambulatory Visit (INDEPENDENT_AMBULATORY_CARE_PROVIDER_SITE_OTHER): Payer: Commercial Managed Care - HMO | Admitting: Physician Assistant

## 2022-02-05 DIAGNOSIS — Z01810 Encounter for preprocedural cardiovascular examination: Secondary | ICD-10-CM

## 2022-02-05 NOTE — Progress Notes (Signed)
Virtual Visit via Telephone Note   Because of Ridley Tabone's co-morbid illnesses, she is at least at moderate risk for complications without adequate follow up.  This format is felt to be most appropriate for this patient at this time.  The patient did not have access to video technology/had technical difficulties with video requiring transitioning to audio format only (telephone).  All issues noted in this document were discussed and addressed.  No physical exam could be performed with this format.  Please refer to the patient's chart for her consent to telehealth for [facility].  Evaluation Performed:  Preoperative cardiovascular risk assessment _____________   Date:  02/05/2022   Patient ID:  [name], DOB [birth year], MRN [id number] Patient Location:  Home Provider location:   Office  Primary Care Provider:  Dorna Mai, MD Primary Cardiologist:  Sanda Klein, MD  Chief Complaint / Patient Profile   56 y.o. y/o female with a h/o acute type I aortic dissection in 2016 (treated with emergency surgery and placement of Hemashield graft and resuspension of aortic valve), chronic diastolic CHF, hypertension who is pending 1 dental extraction with alveoloplasty in all 4 quadrants and presents today for telephonic preoperative cardiovascular risk assessment.  Past Medical History    Past Medical History:  Diagnosis Date   Aortic dissection (HCC)    Status post repair 5/16   Chronic diastolic CHF (congestive heart failure) ([place])    Echo 6/16:  Severe LVH, EF 60-65%, no RWMA, Gr 1 DD, LA upper limits of normal, mild RAE, aneurysmal intra-atrial septum   Fibroid uterus    Hypertension    Past Surgical History:  Procedure Laterality Date   REPLACEMENT ASCENDING AORTA N/A 11/03/2014   Procedure: REPLACEMENT ASCENDING AORTA with a 70m hemashield graft,  resuspension of aortic valve, hypothermic circulatory arrest and cardiopulmonary bypass.;  Surgeon: EGrace Isaac  MD;  [facility] [facility]  Service: Open Heart Surgery;  Laterality: N/A;    Allergies  Allergies  Allergen Reactions   Influenza Vaccines     Can tolerate egg free vaccine     History of Present Illness    Meiko CMishkinis a 56y.o. female who presents via aEngineer, civil (consulting)for a telehealth visit today.  Pt was last seen in cardiology clinic on 11/13/2021 by Dr. CSallyanne Kuster  [facility]  The patient is now pending procedure as outlined above. Since her last visit, she is doing well.  She has not had any significant cardiovascular symptoms.  She has been trying to get this tooth out for 6 months and has had difficult time back-and-forth with clearance.  She is very active and works at the [employer]  She has no issues with walking or going up and down stairs.  She swims 3 times a month at her neighbor's pool.  She also participates [place] double [place] road.  She lives on the weekends as well.  She does all her own housework and even some yard work.  Because of all this she scored a 6.55 on the DASI scale.  This exceeds the 4 METS minimum requirement.  She knows that she will need antibiotics before her procedure and she already has a prescription.  Reports no shortness of breath nor dyspnea on exertion. Reports no chest pain, pressure, or tightness. No edema, orthopnea, PND. Reports no palpitations.     Home Medications    Prior to Admission medications  Medication Sig Start Date End Date Taking? Authorizing Provider  acetaminophen (TYLENOL) 500 MG tablet Take 500 mg by mouth every 6 (six) hours as needed for mild pain.    [provider]  amLODipine (NORVASC) 10 MG tablet Take 1 tablet (10 mg total) by mouth daily. 01/05/22   Dorna Mai, MD  amoxicillin (AMOXIL) 500 MG capsule As one time dose before procedure 01/22/22   Dorna Mai, MD  amoxicillin-clavulanate (AUGMENTIN) 875-125 MG tablet Take  1 tablet by mouth 2 (two) times daily. 11/13/21   Croitoru, Mihai, MD  aspirin EC 81 MG tablet Take 1 tablet (81 mg total) by mouth daily. 06/13/19   Croitoru, Mihai, MD  carvedilol (COREG) 25 MG tablet TAKE ONE TABLET BY MOUTH TWICE A DAY WITH A MEAL 11/12/21   Dorna Mai, MD  cyclobenzaprine (FLEXERIL) 5 MG tablet Take 1 tablet (5 mg total) by mouth 3 (three) times daily as needed for muscle spasms. 11/12/21   Dorna Mai, MD  furosemide (LASIX) 20 MG tablet Take 1 tablet (20 mg total) by mouth as needed for edema. 11/08/20   Croitoru, Mihai, MD  hydrochlorothiazide (HYDRODIURIL) 25 MG tablet Take 1 tablet (25 mg total) by mouth daily. 05/12/21   Fenton Foy, NP  levETIRAcetam (KEPPRA) 500 MG tablet Take 1 tablet (500 mg total) by mouth 2 (two) times daily. 07/03/21   Cameron Sprang, MD  lisinopril (ZESTRIL) 40 MG tablet Take 1 tablet (40 mg total) by mouth daily. 01/05/22   Dorna Mai, MD  potassium chloride (KLOR-CON) 10 MEQ tablet Take 1 tablet (10 mEq total) by mouth daily. 11/12/21   Dorna Mai, MD  simvastatin (ZOCOR) 20 MG tablet Take 1 tablet (20 mg total) by mouth daily. 11/12/21   Dorna Mai, MD  traMADol (ULTRAM) 50 MG tablet Take 50 mg by mouth every 6 (six) hours as needed for moderate pain.    [provider]    Physical Exam    Vital Signs:  Brittne Galindo does not have vital signs available for review today.  Given telephonic nature of communication, physical exam is limited. AAOx3. NAD. Normal affect.  Speech and respirations are unlabored.  Accessory Clinical Findings    None  Assessment & Plan    1.  Preoperative Cardiovascular Risk Assessment:  Ms. Verrill's perioperative risk of a major cardiac event is 0.4% according to the Revised Cardiac Risk Index (RCRI).  Therefore, she is at low risk for perioperative complications.   Her functional capacity is excellent at 6.55 METs according to the [place] Activity Status Index  (DASI). Recommendations: According to ACC/AHA guidelines, no further cardiovascular testing needed.  The patient may proceed to surgery at acceptable risk.   Antiplatelet and/or Anticoagulation Recommendations:  SBE is needed for this patient. She has a prescription already.   A copy of this note will be routed to requesting surgeon.  Time:   Today, I have spent 15 minutes with the patient with telehealth technology discussing medical history, symptoms, and management plan.     Elgie Collard, PA-C  02/05/2022, 8:24 AM

## 2022-02-06 ENCOUNTER — Encounter: Payer: Self-pay | Admitting: Neurology

## 2022-02-06 ENCOUNTER — Ambulatory Visit: Payer: Commercial Managed Care - HMO | Admitting: Neurology

## 2022-02-06 DIAGNOSIS — G40009 Localization-related (focal) (partial) idiopathic epilepsy and epileptic syndromes with seizures of localized onset, not intractable, without status epilepticus: Secondary | ICD-10-CM

## 2022-02-06 MED ORDER — LEVETIRACETAM 500 MG PO TABS: 500.0000 mg | ORAL_TABLET | Freq: Two times a day (BID) | ORAL | 3 refills | Status: DC

## 2022-02-06 NOTE — Progress Notes (Signed)
NEUROLOGY FOLLOW UP OFFICE NOTE  Ashley Guerrero [id number] August 02, 1965  HISTORY OF PRESENT ILLNESS: I had the pleasure of seeing Ashley Guerrero in follow-up in the neurology clinic on 02/06/2022.  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 She continues to do well seizure-free since 03/17/2021 on Levetiracetam '500mg'$  BID without side effects. She denies any staring/unresponsive episodes, gaps in time, olfactory/gustatory hallucinations, focal numbness/tingling/weakness, myoclonic jerks. No headaches, dizziness, vision changes, no falls. Sleep is good, mood is okay. She had some body pains after working in the yard and took [employer] and Tramadol only one time.    History on Initial Assessment 04/04/2021: This is a very pleasant 56 year old right-handed woman with a history of hypertension, remote history of aortic dissection s/p aortic graft placement, presenting for new onset seizures. She was in her usual state of health until 01/29/21 while at work at [employer] when she was noted to be staring with nonsensical speech, unresponsive to questions. She was brought to the ER where she was noted to have clear but nonsensical speech. She was making complete sentences that were disconnected from the question asked. CT head negative. She started improving after brain MRI which did not show any acute changes. There was mild chronic microvascular disease and small remote right cerebellar infarct. She was amnestic of events and reported feeling foggy with word-finding difficulties. Her EEG showed left frontotemporal slowing and a spike in the left frontotemporal region.  She was discharged home on Levetiracetam but did not start medication stating she was in denial. She was back in the ER on 03/17/21 when she had another aphasic episode while talking to her niece on the phone. Her niece called EMS and they found her confused, smiling at them, talking about things at work. She had  "seizure-like activity" en route and was given Midazolam. She woke up in the ER feeling exhausted, no focal weakness, tongue bite or incontinence. Her right shoulder felt sore. She denies any further seizures since starting Levetiracetam '500mg'$  BID. She lives alone. On further questioning, she reports that for the past month or so, she has been having episodes of deja vu where she felt she did something but had not done it. She denies any olfactory/gustatory hallucinations, deja vu, rising epigastric sensation, focal numbness/tingling/weakness, myoclonic jerks. She had a headache for 2-3 days after the recent seizure, none since. No dizziness, diplopia, dysarthria/dysphagia, neck/back pain, bowel/bladder dysfunction. Her mouth gets dry a lot. She has been feeling a little anxious, crying a lot, which is new since July/August (prior to starting LEV). She feels the Levetiracetam is working for her. She notes mood is not good, people have noticed she is not as "happy go-lucky," snapping at people since July/August. She reports taking Valium for anxiety in her 56s. Sleep is good. She denies any triggers to the recent seizures. Memory comes and goes, "not that good." She used to remember everything but now has word-finding difficulties.   Epilepsy Risk Factors:  Maternal cousin has seizures. Otherwise she had a normal birth and early development.  There is no history of febrile convulsions, CNS infections such as meningitis/encephalitis, significant traumatic brain injury, neurosurgical procedures.   PAST MEDICAL HISTORY: Past Medical History:  Diagnosis Date   Aortic dissection ([place])    Status post repair 5/16   Chronic diastolic CHF (congestive heart failure) ([place])    Echo 6/16:  Severe LVH, EF 60-65%, no RWMA, Gr 1 DD, LA upper limits of  normal, mild RAE, aneurysmal intra-atrial septum   Fibroid uterus    Hypertension     MEDICATIONS: Current Outpatient Medications on File Prior to Visit  Medication  Sig Dispense Refill   acetaminophen (TYLENOL) 500 MG tablet Take 500 mg by mouth every 6 (six) hours as needed for mild pain.     amLODipine (NORVASC) 10 MG tablet Take 1 tablet (10 mg total) by mouth daily. 90 tablet 0   aspirin EC 81 MG tablet Take 1 tablet (81 mg total) by mouth daily. 90 tablet 3   carvedilol (COREG) 25 MG tablet TAKE ONE TABLET BY MOUTH TWICE A DAY WITH A MEAL 180 tablet 1   cyclobenzaprine (FLEXERIL) 5 MG tablet Take 1 tablet (5 mg total) by mouth 3 (three) times daily as needed for muscle spasms. 30 tablet 1   furosemide (LASIX) 20 MG tablet Take 1 tablet (20 mg total) by mouth as needed for edema. 90 tablet 1   hydrochlorothiazide (HYDRODIURIL) 25 MG tablet Take 1 tablet (25 mg total) by mouth daily. 90 tablet 3   levETIRAcetam (KEPPRA) 500 MG tablet Take 1 tablet (500 mg total) by mouth 2 (two) times daily. 180 tablet 3   lisinopril (ZESTRIL) 40 MG tablet Take 1 tablet (40 mg total) by mouth daily. 90 tablet 0   potassium chloride (KLOR-CON) 10 MEQ tablet Take 1 tablet (10 mEq total) by mouth daily. 90 tablet 1   simvastatin (ZOCOR) 20 MG tablet Take 1 tablet (20 mg total) by mouth daily. 90 tablet 1   traMADol (ULTRAM) 50 MG tablet Take 50 mg by mouth every 6 (six) hours as needed for moderate pain.     amoxicillin (AMOXIL) 500 MG capsule As one time dose before procedure (Patient not taking: Reported on 02/06/2022) 4 capsule 0   amoxicillin-clavulanate (AUGMENTIN) 875-125 MG tablet Take 1 tablet by mouth 2 (two) times daily. (Patient not taking: Reported on 02/06/2022) 20 tablet 0   No current facility-administered medications on file prior to visit.    ALLERGIES: Allergies  Allergen Reactions   Influenza Vaccines     Can tolerate egg free vaccine     FAMILY HISTORY: Family History  Problem Relation Age of Onset   Hypertension Mother    Breast cancer Mother    Brain cancer Mother    Stroke Father    Heart failure Father    Hypertension Father    Diabetes  Father    Hyperlipidemia Father    Hypertension Sister    Hypertension Brother    Cancer Sister    Breast cancer Maternal Aunt    Breast cancer Paternal Aunt    Breast cancer Maternal Grandmother    Breast cancer Paternal Grandmother    Heart attack Neg Hx     SOCIAL HISTORY: Social History   Socioeconomic History   Marital status: Single    Spouse name: Not on file   Number of children: 0   Years of education: Not on file   Highest education level: Some college, no degree  Occupational History   Not on file  Tobacco Use   Smoking status: Some Days    Packs/day: 1.00    Types: Cigarettes    Start date: 11/03/2014   Smokeless tobacco: Never   Tobacco comments:    5 cig a week  Vaping Use   Vaping Use: Never used  Substance and Sexual Activity   Alcohol use: Not Currently   Drug use: Not Currently   Sexual activity: Not  Currently  Other Topics Concern   Not on file  Social History Narrative   Right handed    Live with self in a one level home   Caffeine rare   Social Determinants of Health   Financial Resource Strain: Not on file  Food Insecurity: Not on file  Transportation Needs: No Transportation Needs (03/02/2019)   PRAPARE - Transportation    Lack of Transportation (Medical): No    Lack of Transportation (Non-Medical): No  Physical Activity: Not on file  Stress: Not on file  Social Connections: Not on file  Intimate Partner Violence: Not on file     PHYSICAL EXAM: General: No acute distress Head:  Normocephalic/atraumatic Skin/Extremities: No rash, no edema Neurological Exam: alert and awake. No aphasia or dysarthria. Fund of knowledge is appropriate.  Attention and concentration are normal.   Cranial nerves: Pupils equal, round. Extraocular movements intact with no nystagmus. Visual fields full.  No facial asymmetry.  Motor: Bulk and tone normal, muscle strength 5/5 throughout with no pronator drift.   Finger to nose testing intact.  Gait narrow-based  and steady, no ataxia.   IMPRESSION: This is a very pleasant 56 yo RH woman with a history of hypertension, remote history of aortic dissection s/p aortic graft placement, with left temporal lobe epilepsy. EEG showed slowing and epileptiform activity over the left frontotemporal region. MRI brain no acute changes. She has been seizure-free since 03/2021 on Levetiracetam '500mg'$  BID, refills sent. She is aware of [place] driving laws to stop driving after a seizure until 6 months seizure-free. Follow-up in 6 months, call for any changes.    Thank you for allowing me to participate in her care.  Please do not hesitate to call for any questions or concerns.    Ellouise Newer, M.D.   CC: Dr. Redmond Pulling

## 2022-02-06 NOTE — Patient Instructions (Signed)
Always good to see you.  Continue Keppra '500mg'$  twice a day  2. Follow-up in 6 months, call for any changes   Seizure Precautions: 1. If medication has been prescribed for you to prevent seizures, take it exactly as directed.  Do not stop taking the medicine without talking to your doctor first, even if you have not had a seizure in a long time.   2. Avoid activities in which a seizure would cause danger to yourself or to others.  Don't operate dangerous machinery, swim alone, or climb in high or dangerous places, such as on ladders, roofs, or girders.  Do not drive unless your doctor says you may.  3. If you have any warning that you may have a seizure, lay down in a safe place where you can't hurt yourself.    4.  No driving for 6 months from last seizure, as per [facility].   Please refer to the following link on the [place] website for more information: http://[email or web address]/[email or web address]/drivingu.cfm   5.  Maintain good sleep hygiene. Avoid alcohol.  6.  Contact your doctor if you have any problems that may be related to the medicine you are taking.  7.  Call 911 and bring the patient back to the ED if:        A.  The seizure lasts longer than 5 minutes.       B.  The patient doesn't awaken shortly after the seizure  C.  The patient has new problems such as difficulty seeing, speaking or moving  D.  The patient was injured during the seizure  E.  The patient has a temperature over 102 F (39C)  F.  The patient vomited and now is having trouble breathing

## 2022-05-08 ENCOUNTER — Other Ambulatory Visit: Payer: Self-pay | Admitting: Family Medicine

## 2022-05-08 DIAGNOSIS — I10 Essential (primary) hypertension: Secondary | ICD-10-CM

## 2022-05-08 DIAGNOSIS — Z95828 Presence of other vascular implants and grafts: Secondary | ICD-10-CM

## 2022-05-08 NOTE — Telephone Encounter (Signed)
Requested Prescriptions  Pending Prescriptions Disp Refills   amLODipine (NORVASC) 10 MG tablet [Pharmacy Med Name: AMLODIPINE 10 MG TAB[*]] 90 tablet 0    Sig: TAKE ONE TABLET BY MOUTH ONE TIME DAILY     Cardiovascular: Calcium Channel Blockers 2 Passed - 05/08/2022  2:27 AM      Passed - Last BP in normal range    BP Readings from Last 1 Encounters:  01/22/22 121/74         Passed - Last Heart Rate in normal range    Pulse Readings from Last 1 Encounters:  01/22/22 64         Passed - Valid encounter within last 6 months    Recent Outpatient Visits           3 months ago Preop examination   Primary Care at [facility], MD   5 months ago Essential hypertension   Primary Care at [facility], MD   5 months ago Essential hypertension   Primary Care at [facility], MD   8 months ago Essential (primary) hypertension   Primary Care at [facility] Amy J, NP   12 months ago Essential hypertension   Primary Care at [facility], Kriste Basque, NP       Future Appointments             In 2 weeks Dorna Mai, MD Primary Care at [facility]             potassium chloride (KLOR-CON) 10 MEQ tablet [Pharmacy Med Name: POTASSIUM CHLORIDE ER 10 MEQ TAB] 90 tablet 0    Sig: TAKE ONE TABLET BY MOUTH ONE TIME DAILY     Endocrinology:  Minerals - Potassium Supplementation Passed - 05/08/2022  2:27 AM      Passed - K in normal range and within 360 days    Potassium  Date Value Ref Range Status  08/15/2021 4.0 3.5 - 5.2 mmol/L Final         Passed - Cr in normal range and within 360 days    Creatinine, Ser  Date Value Ref Range Status  08/15/2021 1.00 0.57 - 1.00 mg/dL Final         Passed - Valid encounter within last 12 months    Recent Outpatient Visits           3 months ago Preop examination   Primary Care at [facility], MD   5 months ago Essential hypertension   Primary  Care at [facility], MD   5 months ago Essential hypertension   Primary Care at [facility], MD   8 months ago Essential (primary) hypertension   Primary Care at [facility], Amy J, NP   12 months ago Essential hypertension   Primary Care at [facility], Kriste Basque, NP       Future Appointments             In 2 weeks Dorna Mai, MD Primary Care at [facility]             simvastatin (ZOCOR) 20 MG tablet [Pharmacy Med Name: SIMVASTATIN 20 MG TAB[*]] 90 tablet 1    Sig: TAKE ONE TABLET BY MOUTH ONE TIME DAILY     Cardiovascular:  Antilipid - Statins Failed - 05/08/2022  2:27 AM      Failed - Lipid Panel in normal range within the last 12  months    Cholesterol, Total  Date Value Ref Range Status  03/23/2019 161 100 - 199 mg/dL Final   Cholesterol  Date Value Ref Range Status  01/30/2021 162 0 - 200 mg/dL Final   LDL Chol Calc (NIH)  Date Value Ref Range Status  03/23/2019 91 0 - 99 mg/dL Final   LDL Cholesterol  Date Value Ref Range Status  01/30/2021 97 0 - 99 mg/dL Final    Comment:           Total Cholesterol/HDL:CHD Risk Coronary Heart Disease Risk Table                     Men   Women  1/2 Average Risk   3.4   3.3  Average Risk       5.0   4.4  2 X Average Risk   9.6   7.1  3 X Average Risk  23.4   11.0        Use the calculated Patient Ratio above and the CHD Risk Table to determine the patient's CHD Risk.        ATP III CLASSIFICATION (LDL):  <100     mg/dL   Optimal  100-129  mg/dL   Near or Above                    Optimal  130-159  mg/dL   Borderline  160-189  mg/dL   High  >190     mg/dL   Very High Performed at [facility] [address], [place], [place] [address]    LDL Direct  Date Value Ref Range Status  05/09/2020 99 0 - 99 mg/dL Final   HDL  Date Value Ref Range Status  01/30/2021 42 >40 mg/dL Final  03/23/2019 51 >39 mg/dL Final   Triglycerides  Date Value  Ref Range Status  01/30/2021 115 <150 mg/dL Final         Passed - Patient is not pregnant      Passed - Valid encounter within last 12 months    Recent Outpatient Visits           3 months ago Preop examination   Primary Care at [facility], MD   5 months ago Essential hypertension   Primary Care at [facility], MD   5 months ago Essential hypertension   Primary Care at [facility], MD   8 months ago Essential (primary) hypertension   Primary Care at [facility], Amy J, NP   12 months ago Essential hypertension   Primary Care at [facility], Kriste Basque, NP       Future Appointments             In 2 weeks Dorna Mai, MD Primary Care at [facility]             carvedilol (COREG) 25 MG tablet [Pharmacy Med Name: CARVEDILOL 25 MG TAB[*]] 180 tablet 1    Sig: TAKE ONE TABLET BY MOUTH TWICE A DAY WITH A MEAL     Cardiovascular: Beta Blockers 3 Failed - 05/08/2022  2:27 AM      Failed - AST in normal range and within 360 days    AST  Date Value Ref Range Status  03/17/2021 18 15 - 41 U/L Final         Failed - ALT in normal range and within 360 days  ALT  Date Value Ref Range Status  03/17/2021 17 0 - 44 U/L Final         Passed - Cr in normal range and within 360 days    Creatinine, Ser  Date Value Ref Range Status  08/15/2021 1.00 0.57 - 1.00 mg/dL Final         Passed - Last BP in normal range    BP Readings from Last 1 Encounters:  01/22/22 121/74         Passed - Last Heart Rate in normal range    Pulse Readings from Last 1 Encounters:  01/22/22 64         Passed - Valid encounter within last 6 months    Recent Outpatient Visits           3 months ago Preop examination   Primary Care at [facility], MD   5 months ago Essential hypertension   Primary Care at [facility], MD   5 months ago Essential hypertension   Primary Care  at [facility], MD   8 months ago Essential (primary) hypertension   Primary Care at [facility], Amy J, NP   12 months ago Essential hypertension   Primary Care at [facility], Kriste Basque, NP       Future Appointments             In 2 weeks Dorna Mai, MD Primary Care at [facility]             lisinopril (ZESTRIL) 40 MG tablet [Pharmacy Med Name: LISINOPRIL 40 MG TAB[*]] 90 tablet 0    Sig: TAKE ONE TABLET BY MOUTH ONE TIME DAILY     Cardiovascular:  ACE Inhibitors Failed - 05/08/2022  2:27 AM      Failed - Cr in normal range and within 180 days    Creatinine, Ser  Date Value Ref Range Status  08/15/2021 1.00 0.57 - 1.00 mg/dL Final         Failed - K in normal range and within 180 days    Potassium  Date Value Ref Range Status  08/15/2021 4.0 3.5 - 5.2 mmol/L Final         Passed - Patient is not pregnant      Passed - Last BP in normal range    BP Readings from Last 1 Encounters:  01/22/22 121/74         Passed - Valid encounter within last 6 months    Recent Outpatient Visits           3 months ago Preop examination   Primary Care at [facility], MD   5 months ago Essential hypertension   Primary Care at [facility], MD   5 months ago Essential hypertension   Primary Care at [facility], MD   8 months ago Essential (primary) hypertension   Primary Care at [facility], Amy J, NP   12 months ago Essential hypertension   Primary Care at [facility], Kriste Basque, NP       Future Appointments             In 2 weeks Dorna Mai, MD Primary Care at [facility]

## 2022-05-08 NOTE — Telephone Encounter (Signed)
Requested medication (s) are due for refill today: yes  Requested medication (s) are on the active medication list: yes  Last refill:  Zocor 11/12/21 #90 with 1 RF, Coreg 11/12/21 #180 with 1 RF  Future visit scheduled: yes Nov  Notes to clinic:  Failed protocol of labs within 12 months, lipids from 01/30/21, AST, ALT from 03/17/21, has upcoming appt, please assess.       Requested Prescriptions  Pending Prescriptions Disp Refills   simvastatin (ZOCOR) 20 MG tablet [Pharmacy Med Name: SIMVASTATIN 20 MG TAB[*]] 90 tablet 1    Sig: TAKE ONE TABLET BY MOUTH ONE TIME DAILY     Cardiovascular:  Antilipid - Statins Failed - 05/08/2022  2:27 AM      Failed - Lipid Panel in normal range within the last 12 months    Cholesterol, Total  Date Value Ref Range Status  03/23/2019 161 100 - 199 mg/dL Final   Cholesterol  Date Value Ref Range Status  01/30/2021 162 0 - 200 mg/dL Final   LDL Chol Calc (NIH)  Date Value Ref Range Status  03/23/2019 91 0 - 99 mg/dL Final   LDL Cholesterol  Date Value Ref Range Status  01/30/2021 97 0 - 99 mg/dL Final    Comment:           Total Cholesterol/HDL:CHD Risk Coronary Heart Disease Risk Table                     Men   Women  1/2 Average Risk   3.4   3.3  Average Risk       5.0   4.4  2 X Average Risk   9.6   7.1  3 X Average Risk  23.4   11.0        Use the calculated Patient Ratio above and the CHD Risk Table to determine the patient's CHD Risk.        ATP III CLASSIFICATION (LDL):  <100     mg/dL   Optimal  100-129  mg/dL   Near or Above                    Optimal  130-159  mg/dL   Borderline  160-189  mg/dL   High  >190     mg/dL   Very High Performed at [facility] [address], [place], [place] [address]    LDL Direct  Date Value Ref Range Status  05/09/2020 99 0 - 99 mg/dL Final   HDL  Date Value Ref Range Status  01/30/2021 42 >40 mg/dL Final  03/23/2019 51 >39 mg/dL Final   Triglycerides  Date Value Ref Range  Status  01/30/2021 115 <150 mg/dL Final         Passed - Patient is not pregnant      Passed - Valid encounter within last 12 months    Recent Outpatient Visits           3 months ago Preop examination   Primary [facility] at [facility], MD   5 months ago Essential hypertension   Primary Care at [facility], MD   5 months ago Essential hypertension   Primary [facility] at [facility], MD   8 months ago Essential (primary) hypertension   Primary Care at [facility], Amy J, NP   12 months ago Essential hypertension   Primary Care at [facility] [place],  NP       Future Appointments             In 2 weeks Dorna Mai, MD Primary Care at [facility]             carvedilol (COREG) 25 MG tablet [Pharmacy Med Name: CARVEDILOL 25 MG TAB[*]] 180 tablet 1    Sig: TAKE ONE TABLET BY MOUTH TWICE A DAY WITH A MEAL     Cardiovascular: Beta Blockers 3 Failed - 05/08/2022  2:27 AM      Failed - AST in normal range and within 360 days    AST  Date Value Ref Range Status  03/17/2021 18 15 - 41 U/L Final         Failed - ALT in normal range and within 360 days    ALT  Date Value Ref Range Status  03/17/2021 17 0 - 44 U/L Final         Passed - Cr in normal range and within 360 days    Creatinine, Ser  Date Value Ref Range Status  08/15/2021 1.00 0.57 - 1.00 mg/dL Final         Passed - Last BP in normal range    BP Readings from Last 1 Encounters:  01/22/22 121/74         Passed - Last Heart Rate in normal range    Pulse Readings from Last 1 Encounters:  01/22/22 64         Passed - Valid encounter within last 6 months    Recent Outpatient Visits           3 months ago Preop examination   Primary Care at [facility], MD   5 months ago Essential hypertension   Primary Care at [facility], MD   5 months ago Essential hypertension   Primary Care at [facility], MD   8 months ago Essential (primary) hypertension   Primary Care at [facility], Amy J, NP   12 months ago Essential hypertension   Primary Care at [facility], Kriste Basque, NP       Future Appointments             In 2 weeks Dorna Mai, MD Primary Care at [facility]            Signed Prescriptions Disp Refills   amLODipine (NORVASC) 10 MG tablet 90 tablet 0    Sig: TAKE ONE TABLET BY MOUTH ONE TIME DAILY     Cardiovascular: Calcium Channel Blockers 2 Passed - 05/08/2022  2:27 AM      Passed - Last BP in normal range    BP Readings from Last 1 Encounters:  01/22/22 121/74         Passed - Last Heart Rate in normal range    Pulse Readings from Last 1 Encounters:  01/22/22 64         Passed - Valid encounter within last 6 months    Recent Outpatient Visits           3 months ago Preop examination   Primary Care at [facility], MD   5 months ago Essential hypertension   Primary Care at [facility], MD   5 months ago Essential hypertension   Primary Care at [facility], MD   8 months ago Essential (primary) hypertension   Primary Care at [facility],  Amy J, NP   12 months ago Essential hypertension   Primary Care at [facility], Kriste Basque, NP       Future Appointments             In 2 weeks Dorna Mai, MD Primary Care at [facility]             potassium chloride (KLOR-CON) 10 MEQ tablet 90 tablet 0    Sig: TAKE ONE TABLET BY MOUTH ONE TIME DAILY     Endocrinology:  Minerals - Potassium Supplementation Passed - 05/08/2022  2:27 AM      Passed - K in normal range and within 360 days    Potassium  Date Value Ref Range Status  08/15/2021 4.0 3.5 - 5.2 mmol/L Final         Passed - Cr in normal range and within 360 days    Creatinine, Ser  Date Value Ref Range Status  08/15/2021 1.00 0.57 - 1.00 mg/dL Final          Passed - Valid encounter within last 12 months    Recent Outpatient Visits           3 months ago Preop examination   Primary Care at [facility], MD   5 months ago Essential hypertension   Primary Care at [facility], MD   5 months ago Essential hypertension   Primary Care at [facility], MD   8 months ago Essential (primary) hypertension   Primary Care at [facility], Amy J, NP   12 months ago Essential hypertension   Primary Care at [facility], Kriste Basque, NP       Future Appointments             In 2 weeks Dorna Mai, MD Primary Care at [facility]             lisinopril (ZESTRIL) 40 MG tablet 90 tablet 0    Sig: TAKE ONE TABLET BY MOUTH ONE TIME DAILY     Cardiovascular:  ACE Inhibitors Failed - 05/08/2022  2:27 AM      Failed - Cr in normal range and within 180 days    Creatinine, Ser  Date Value Ref Range Status  08/15/2021 1.00 0.57 - 1.00 mg/dL Final         Failed - K in normal range and within 180 days    Potassium  Date Value Ref Range Status  08/15/2021 4.0 3.5 - 5.2 mmol/L Final         Passed - Patient is not pregnant      Passed - Last BP in normal range    BP Readings from Last 1 Encounters:  01/22/22 121/74         Passed - Valid encounter within last 6 months    Recent Outpatient Visits           3 months ago Preop examination   Primary Care at [facility], MD   5 months ago Essential hypertension   Primary Care at [facility], MD   5 months ago Essential hypertension   Primary Care at [facility], MD   8 months ago Essential (primary) hypertension   Primary Care at [facility], Amy J, NP   12 months ago Essential hypertension   Primary Care at [facility], Kriste Basque, NP       Future  Appointments             In 2 weeks Dorna Mai, MD Primary Care at [facility]

## 2022-05-22 ENCOUNTER — Ambulatory Visit (INDEPENDENT_AMBULATORY_CARE_PROVIDER_SITE_OTHER): Payer: Commercial Managed Care - HMO | Admitting: Family Medicine

## 2022-05-22 ENCOUNTER — Other Ambulatory Visit: Payer: Self-pay | Admitting: Family Medicine

## 2022-05-22 ENCOUNTER — Other Ambulatory Visit (HOSPITAL_COMMUNITY)
Admission: RE | Admit: 2022-05-22 | Discharge: 2022-05-22 | Disposition: A | Discharge status: Home / Self Care | Payer: Commercial Managed Care - HMO | Source: Ambulatory Visit | Attending: Family Medicine | Admitting: Family Medicine

## 2022-05-22 ENCOUNTER — Telehealth: Payer: Self-pay | Admitting: Family Medicine

## 2022-05-22 ENCOUNTER — Other Ambulatory Visit: Payer: Self-pay | Admitting: *Deleted

## 2022-05-22 ENCOUNTER — Encounter: Payer: Self-pay | Admitting: Family Medicine

## 2022-05-22 VITALS — BP 116/69 | HR 61 | Temp 98.1°F | Resp 16 | Ht 66.0 in | Wt 212.0 lb

## 2022-05-22 DIAGNOSIS — Z1329 Encounter for screening for other suspected endocrine disorder: Secondary | ICD-10-CM | POA: Diagnosis not present

## 2022-05-22 DIAGNOSIS — Z01419 Encounter for gynecological examination (general) (routine) without abnormal findings: Secondary | ICD-10-CM

## 2022-05-22 DIAGNOSIS — Z13220 Encounter for screening for lipoid disorders: Secondary | ICD-10-CM | POA: Diagnosis not present

## 2022-05-22 DIAGNOSIS — Z130 Encounter for screening for diseases of the blood and blood-forming organs and certain disorders involving the immune mechanism: Secondary | ICD-10-CM | POA: Diagnosis not present

## 2022-05-22 DIAGNOSIS — Z13228 Encounter for screening for other metabolic disorders: Secondary | ICD-10-CM | POA: Diagnosis not present

## 2022-05-22 DIAGNOSIS — Z1211 Encounter for screening for malignant neoplasm of colon: Secondary | ICD-10-CM

## 2022-05-22 DIAGNOSIS — Z1231 Encounter for screening mammogram for malignant neoplasm of breast: Secondary | ICD-10-CM

## 2022-05-22 DIAGNOSIS — Z0000 Encounter for general adult medical examination without abnormal findings: Secondary | ICD-10-CM | POA: Diagnosis not present

## 2022-05-22 DIAGNOSIS — I10 Essential (primary) hypertension: Secondary | ICD-10-CM

## 2022-05-22 MED ORDER — CYCLOBENZAPRINE HCL 5 MG PO TABS: 5.0000 mg | ORAL_TABLET | Freq: Three times a day (TID) | ORAL | 0 refills | Status: DC | PRN

## 2022-05-22 NOTE — Telephone Encounter (Signed)
When patient was checking out she said that she needs a refill on her Flexaril sent to [facility].

## 2022-05-23 LAB — CBC WITH DIFFERENTIAL/PLATELET
Basophils Absolute: 0.1 10*3/uL (ref 0.0–0.2)
Basos: 1 %
EOS (ABSOLUTE): 0.3 10*3/uL (ref 0.0–0.4)
Eos: 4 %
Hematocrit: 42.1 % (ref 34.0–46.6)
Hemoglobin: 14.3 g/dL (ref 11.1–15.9)
Immature Grans (Abs): 0 10*3/uL (ref 0.0–0.1)
Immature Granulocytes: 0 %
Lymphocytes Absolute: 2.9 10*3/uL (ref 0.7–3.1)
Lymphs: 41 %
MCH: 31.6 pg (ref 26.6–33.0)
MCHC: 34 g/dL (ref 31.5–35.7)
MCV: 93 fL (ref 79–97)
Monocytes Absolute: 0.5 10*3/uL (ref 0.1–0.9)
Monocytes: 7 %
Neutrophils Absolute: 3.3 10*3/uL (ref 1.4–7.0)
Neutrophils: 47 %
Platelets: 355 10*3/uL (ref 150–450)
RBC: 4.52 x10E6/uL (ref 3.77–5.28)
RDW: 13.1 % (ref 11.7–15.4)
WBC: 7 10*3/uL (ref 3.4–10.8)

## 2022-05-23 LAB — CMP14+EGFR
ALT: 20 IU/L (ref 0–32)
AST: 16 IU/L (ref 0–40)
Albumin/Globulin Ratio: 1.8 (ref 1.2–2.2)
Albumin: 4.5 g/dL (ref 3.8–4.9)
Alkaline Phosphatase: 94 IU/L (ref 44–121)
BUN/Creatinine Ratio: 15 (ref 9–23)
BUN: 14 mg/dL (ref 6–24)
Bilirubin Total: <0.2 mg/dL (ref 0.0–1.2)
CO2: 26 mmol/L (ref 20–29)
Calcium: 9.7 mg/dL (ref 8.7–10.2)
Chloride: 100 mmol/L (ref 96–106)
Creatinine, Ser: 0.92 mg/dL (ref 0.57–1.00)
Globulin, Total: 2.5 g/dL (ref 1.5–4.5)
Glucose: 99 mg/dL (ref 70–99)
Potassium: 4.3 mmol/L (ref 3.5–5.2)
Sodium: 140 mmol/L (ref 134–144)
Total Protein: 7 g/dL (ref 6.0–8.5)
eGFR: 73 mL/min/{1.73_m2} (ref >59)

## 2022-05-23 LAB — LIPID PANEL
Chol/HDL Ratio: 3.8 ratio (ref 0.0–4.4)
Cholesterol, Total: 176 mg/dL (ref 100–199)
HDL: 46 mg/dL (ref >39)
LDL Chol Calc (NIH): 103 mg/dL — ABNORMAL HIGH (ref 0–99)
Triglycerides: 151 mg/dL — ABNORMAL HIGH (ref 0–149)
VLDL Cholesterol Cal: 27 mg/dL (ref 5–40)

## 2022-05-23 LAB — HEMOGLOBIN A1C
Est. average glucose Bld gHb Est-mCnc: 120 mg/dL
Hgb A1c MFr Bld: 5.8 % — ABNORMAL HIGH (ref 4.8–5.6)

## 2022-05-23 LAB — TSH: TSH: 3 u[IU]/mL (ref 0.450–4.500)

## 2022-05-23 LAB — VITAMIN D 25 HYDROXY (VIT D DEFICIENCY, FRACTURES): Vit D, 25-Hydroxy: 14.9 ng/mL — ABNORMAL LOW (ref 30.0–100.0)

## 2022-05-25 ENCOUNTER — Encounter: Payer: Self-pay | Admitting: Family Medicine

## 2022-05-25 ENCOUNTER — Other Ambulatory Visit: Payer: Self-pay | Admitting: Family Medicine

## 2022-05-25 LAB — CERVICOVAGINAL ANCILLARY ONLY
Bacterial Vaginitis (gardnerella): NEGATIVE
Candida Glabrata: NEGATIVE
Candida Vaginitis: NEGATIVE
Chlamydia: NEGATIVE
Comment: NEGATIVE
Comment: NEGATIVE
Comment: NEGATIVE
Comment: NEGATIVE
Comment: NEGATIVE
Comment: NORMAL
Neisseria Gonorrhea: NEGATIVE
Trichomonas: NEGATIVE

## 2022-05-25 LAB — CYTOLOGY - PAP
Adequacy: ABSENT
Diagnosis: NEGATIVE

## 2022-05-25 MED ORDER — VITAMIN D (ERGOCALCIFEROL) 1.25 MG (50000 UNIT) PO CAPS: 50000.0000 [IU] | ORAL_CAPSULE | ORAL | 0 refills | Status: AC

## 2022-05-25 NOTE — Progress Notes (Signed)
Established Patient Office Visit  Subjective    Patient ID: Ashley Guerrero, female    DOB: 07-03-1966  Age: 56 y.o. MRN: [id number]  CC:  Chief Complaint  Patient presents with   Annual Exam   Gynecologic Exam    HPI Ashley Guerrero presents for routine annual exam. Patient denies acute complaints.    Outpatient Encounter Medications as of 05/22/2022  Medication Sig   acetaminophen (TYLENOL) 500 MG tablet Take 500 mg by mouth every 6 (six) hours as needed for mild pain.   amLODipine (NORVASC) 10 MG tablet TAKE ONE TABLET BY MOUTH ONE TIME DAILY   aspirin EC 81 MG tablet Take 1 tablet (81 mg total) by mouth daily.   carvedilol (COREG) 25 MG tablet TAKE ONE TABLET BY MOUTH TWICE A DAY WITH A MEAL   furosemide (LASIX) 20 MG tablet Take 1 tablet (20 mg total) by mouth as needed for edema.   hydrochlorothiazide (HYDRODIURIL) 25 MG tablet Take 1 tablet (25 mg total) by mouth daily.   levETIRAcetam (KEPPRA) 500 MG tablet Take 1 tablet (500 mg total) by mouth 2 (two) times daily.   lisinopril (ZESTRIL) 40 MG tablet TAKE ONE TABLET BY MOUTH ONE TIME DAILY   potassium chloride (KLOR-CON) 10 MEQ tablet TAKE ONE TABLET BY MOUTH ONE TIME DAILY   simvastatin (ZOCOR) 20 MG tablet TAKE ONE TABLET BY MOUTH ONE TIME DAILY   traMADol (ULTRAM) 50 MG tablet Take 50 mg by mouth every 6 (six) hours as needed for moderate pain.   [DISCONTINUED] amoxicillin (AMOXIL) 500 MG capsule As one time dose before procedure (Patient not taking: Reported on 02/06/2022)   [DISCONTINUED] amoxicillin-clavulanate (AUGMENTIN) 875-125 MG tablet Take 1 tablet by mouth 2 (two) times daily. (Patient not taking: Reported on 02/06/2022)   [DISCONTINUED] cyclobenzaprine (FLEXERIL) 5 MG tablet Take 1 tablet (5 mg total) by mouth 3 (three) times daily as needed for muscle spasms.   No facility-administered encounter medications on file as of 05/22/2022.    Past Medical History:  Diagnosis Date   Aortic dissection (HCC)     Status post repair 5/16   Chronic diastolic CHF (congestive heart failure) ([place])    Echo 6/16:  Severe LVH, EF 60-65%, no RWMA, Gr 1 DD, LA upper limits of normal, mild RAE, aneurysmal intra-atrial septum   Fibroid uterus    Hypertension     Past Surgical History:  Procedure Laterality Date   REPLACEMENT ASCENDING AORTA N/A 11/03/2014   Procedure: REPLACEMENT ASCENDING AORTA with a 72m hemashield graft,  resuspension of aortic valve, hypothermic circulatory arrest and cardiopulmonary bypass.;  Surgeon: EGrace Isaac MD;  [place] [facility]  Service: Open Heart Surgery;  Laterality: N/A;    Family History  Problem Relation Age of Onset   Hypertension Mother    Breast cancer Mother    Brain cancer Mother    Stroke Father    Heart failure Father    Hypertension Father    Diabetes Father    Hyperlipidemia Father    Hypertension Sister    Hypertension Brother    Cancer Sister    Breast cancer Maternal Aunt    Breast cancer Paternal Aunt    Breast cancer Maternal Grandmother    Breast cancer Paternal Grandmother    Heart attack Neg Hx     Social History   Socioeconomic History   Marital status: Single    Spouse name: Not on file   Number of children: 0   Years of education: Not  on file   Highest education level: Some college, no degree  Occupational History   Not on file  Tobacco Use   Smoking status: Some Days    Packs/day: 1.00    Types: Cigarettes    Start date: 11/03/2014   Smokeless tobacco: Never   Tobacco comments:    5 cig a week  Vaping Use   Vaping Use: Never used  Substance and Sexual Activity   Alcohol use: Not Currently   Drug use: Not Currently   Sexual activity: Not Currently  Other Topics Concern   Not on file  Social History Narrative   Right handed    Live with self in a one level home   Caffeine rare   Social Determinants of Health   Financial Resource Strain: Not on file  Food Insecurity: Not on file  Transportation Needs: No  Transportation Needs (03/02/2019)   PRAPARE - Hydrologist (Medical): No    Lack of Transportation (Non-Medical): No  Physical Activity: Not on file  Stress: Not on file  Social Connections: Not on file  Intimate Partner Violence: Not on file    Review of Systems  All other systems reviewed and are negative.       Objective    BP 116/69   Pulse 61   Temp 98.1 F (36.7 C) (Oral)   Resp 16   Ht _0  (1.676 m)   Wt 212 lb (96.2 kg)   LMP 10/29/2014   SpO2 93%   BMI 34.22 kg/m   Physical Exam Vitals and nursing note reviewed.  Constitutional:      General: She is not in acute distress. HENT:     Head: Normocephalic and atraumatic.     Right Ear: Tympanic membrane, ear canal and external ear normal.     Left Ear: Tympanic membrane, ear canal and external ear normal.     Nose: Nose normal.     Mouth/Throat:     Mouth: Mucous membranes are moist.     Pharynx: Oropharynx is clear.  Eyes:     Conjunctiva/sclera: Conjunctivae normal.     Pupils: Pupils are equal, round, and reactive to light.  Neck:     Thyroid: No thyromegaly.  Cardiovascular:     Rate and Rhythm: Normal rate and regular rhythm.     Heart sounds: Normal heart sounds. No murmur heard. Pulmonary:     Effort: Pulmonary effort is normal. No respiratory distress.     Breath sounds: Normal breath sounds.  Abdominal:     General: There is no distension.     Palpations: Abdomen is soft. There is no mass.     Tenderness: There is no abdominal tenderness.     Hernia: There is no hernia in the left inguinal area or right inguinal area.  Genitourinary:    Exam position: Supine.     Labia:        Right: No lesion.        Left: No lesion.      Vagina: Normal.     Cervix: Normal.     Uterus: Normal.      Adnexa: Right adnexa normal.     Rectum: Normal.  Musculoskeletal:        General: Normal range of motion.     Cervical back: Normal range of motion and neck supple.   Skin:    General: Skin is warm and dry.  Neurological:     General: No focal  deficit present.     Mental Status: She is alert and oriented to person, place, and time.  Psychiatric:        Mood and Affect: Mood normal.        Behavior: Behavior normal.         Assessment & Plan:   1. Annual physical exam  - CMP14+EGFR  2. Screening for deficiency anemia  - CBC with Differential  3. Screening for lipid disorders  - Lipid Panel  4. Screening for endocrine/metabolic/immunity disorders  - TSH - Vitamin D, 25-hydroxy - Hemoglobin A1c  5. Encounter for screening mammogram for malignant neoplasm of breast  - MM Digital Screening; Future  6. Screening for colon cancer  - Cologuard  7. Pap smear, as part of routine gynecological examination  - Cervicovaginal ancillary only - Cytology - PAP    No follow-ups on file.   Becky Sax, MD

## 2022-06-17 LAB — COLOGUARD: COLOGUARD: NEGATIVE

## 2022-07-07 ENCOUNTER — Other Ambulatory Visit: Payer: Self-pay | Admitting: Family Medicine

## 2022-07-07 DIAGNOSIS — I10 Essential (primary) hypertension: Secondary | ICD-10-CM

## 2022-07-07 NOTE — Telephone Encounter (Signed)
Medication Refill - Medication: hydrochlorothiazide (HYDRODIURIL) 25 MG tablet   Has the patient contacted their pharmacy? Yes.   [facility] this morning.  Preferred Pharmacy (with phone number or street name): [facility] [address] [place], [place]. AT [address]  Has the patient been seen for an appointment in the last year OR does the patient have an upcoming appointment? Yes.    Agent: Please be advised that RX refills may take up to 3 business days. We ask that you follow-up with your pharmacy.   Pt would also like the dr to review her medications, and refill everything she needs. Pt states she is at work, and not sure what all she needs. But asked the dr review for her and refill as designated.

## 2022-07-08 MED ORDER — HYDROCHLOROTHIAZIDE 25 MG PO TABS: 25.0000 mg | ORAL_TABLET | Freq: Every day | ORAL | 1 refills | Status: DC

## 2022-07-08 NOTE — Telephone Encounter (Signed)
Requested Prescriptions  Pending Prescriptions Disp Refills   hydrochlorothiazide (HYDRODIURIL) 25 MG tablet 90 tablet 1    Sig: Take 1 tablet (25 mg total) by mouth daily.     Cardiovascular: Diuretics - Thiazide Passed - 07/07/2022 11:28 AM      Passed - Cr in normal range and within 180 days    Creatinine, Ser  Date Value Ref Range Status  05/22/2022 0.92 0.57 - 1.00 mg/dL Final         Passed - K in normal range and within 180 days    Potassium  Date Value Ref Range Status  05/22/2022 4.3 3.5 - 5.2 mmol/L Final         Passed - Na in normal range and within 180 days    Sodium  Date Value Ref Range Status  05/22/2022 140 134 - 144 mmol/L Final         Passed - Last BP in normal range    BP Readings from Last 1 Encounters:  05/22/22 116/69         Passed - Valid encounter within last 6 months    Recent Outpatient Visits           1 month ago Annual physical exam   Primary Care at [facility], MD   5 months ago Preop examination   Primary Care at [facility], MD   7 months ago Essential hypertension   Primary Care at [facility], MD   7 months ago Essential hypertension   Primary Care at [facility], MD   10 months ago Essential (primary) hypertension   Primary Care at [facility], Flonnie Hailstone, NP

## 2022-08-06 ENCOUNTER — Other Ambulatory Visit: Payer: Self-pay | Admitting: Surgery

## 2022-08-06 DIAGNOSIS — I71019 Dissection of thoracic aorta, unspecified: Secondary | ICD-10-CM

## 2022-08-15 ENCOUNTER — Other Ambulatory Visit: Payer: Self-pay | Admitting: Family Medicine

## 2022-08-15 DIAGNOSIS — I10 Essential (primary) hypertension: Secondary | ICD-10-CM

## 2022-08-17 NOTE — Telephone Encounter (Signed)
Requested medication (s) are due for refill today: yes  Requested medication (s) are on the active medication list: yes  Last refill:  05/22/22  Future visit scheduled: yes  Notes to clinic:  Unable to refill per protocol, cannot delegate.      Requested Prescriptions  Pending Prescriptions Disp Refills   cyclobenzaprine (FLEXERIL) 5 MG tablet [Pharmacy Med Name: CYCLOBENZAPRINE 5 MG TAB] 30 tablet 0    Sig: TAKE ONE TABLET BY MOUTH THREE TIMES A DAY AS NEEDED FOR MUSCLE SPASMS     Not Delegated - Analgesics:  Muscle Relaxants Failed - 08/15/2022  2:27 AM      Failed - This refill cannot be delegated      Passed - Valid encounter within last 6 months    Recent Outpatient Visits           2 months ago Annual physical exam   [place] [facility] at [facility], MD   6 months ago Preop examination   [place] [facility] Care at [facility], MD   8 months ago Essential hypertension   [place] Primary Care at [facility], MD   9 months ago Essential hypertension   [place] [facility] at [facility], MD   1 year ago Essential (primary) hypertension   [place] Primary Care at [facility], Amy J, NP               Vitamin D, Ergocalciferol, (DRISDOL) 1.25 MG (50000 UNIT) CAPS capsule [Pharmacy Med Name: VITAMIN D2 1.25 MG (50000 UNIT)[*]] 12 capsule 0    Sig: TAKE ONE CAPSULE BY MOUTH EVERY 7 DAYS     Endocrinology:  Vitamins - Vitamin D Supplementation 2 Failed - 08/15/2022  2:27 AM      Failed - Manual Review: Route requests for 50,000 IU strength to the provider      Failed - Vitamin D in normal range and within 360 days    Vit D, 25-Hydroxy  Date Value Ref Range Status  05/22/2022 14.9 (L) 30.0 - 100.0 ng/mL Final    Comment:    Vitamin D deficiency has been defined by the Institute of Medicine and an Endocrine Society practice guideline as a level of serum 25-OH vitamin  D less than 20 ng/mL (1,2). The Endocrine Society went on to further define vitamin D insufficiency as a level between 21 and 29 ng/mL (2). 1. IOM (Institute of Medicine). 2010. Dietary reference    intakes for calcium and D. [place]: The    [employer]. 2. Holick MF, Binkley , Bischoff-Ferrari HA, et al.    Evaluation, treatment, and prevention of vitamin D    deficiency: an Endocrine Society clinical practice    guideline. JCEM. 2011 Jul; 96(7):1911-30.          Passed - Ca in normal range and within 360 days    Calcium  Date Value Ref Range Status  05/22/2022 9.7 8.7 - 10.2 mg/dL Final   Calcium, Ion  Date Value Ref Range Status  01/29/2021 1.16 1.15 - 1.40 mmol/L Final         Passed - Valid encounter within last 12 months    Recent Outpatient Visits           2 months ago Annual physical exam   [place] [facility] at [facility], MD   6 months ago Preop examination   [facility] at [facility], MD   8 months ago Essential hypertension   [place] Primary Care at [facility], MD   9 months ago Essential hypertension   [place] Primary Care at [facility] Clyde Canterbury, MD   1 year ago Essential (primary) hypertension   [place] Primary Care at [facility] [place], NP              Signed Prescriptions Disp Refills   potassium chloride (KLOR-CON) 10 MEQ tablet 90 tablet 0    Sig: TAKE ONE TABLET BY MOUTH ONE TIME DAILY     Endocrinology:  Minerals - Potassium Supplementation Passed - 08/15/2022  2:27 AM      Passed - K in normal range and within 360 days    Potassium  Date Value Ref Range Status  05/22/2022 4.3 3.5 - 5.2 mmol/L Final         Passed - Cr in normal range and within 360 days    Creatinine, Ser  Date Value Ref Range Status  05/22/2022 0.92 0.57 - 1.00 mg/dL Final         Passed - Valid encounter within last 12 months    Recent  Outpatient Visits           2 months ago Annual physical exam   [place] [facility] Care at [facility], MD   6 months ago Preop examination   [place] Primary Care at [facility], MD   8 months ago Essential hypertension   [place] Primary Care at [facility], MD   9 months ago Essential hypertension   [place] [facility] Care at [facility], MD   1 year ago Essential (primary) hypertension   [place] Primary Care at [facility], Amy J, NP               amLODipine (NORVASC) 10 MG tablet 90 tablet 0    Sig: TAKE ONE TABLET BY MOUTH ONE TIME DAILY     Cardiovascular: Calcium Channel Blockers 2 Passed - 08/15/2022  2:27 AM      Passed - Last BP in normal range    BP Readings from Last 1 Encounters:  05/22/22 116/69         Passed - Last Heart Rate in normal range    Pulse Readings from Last 1 Encounters:  05/22/22 61         Passed - Valid encounter within last 6 months    Recent Outpatient Visits           2 months ago Annual physical exam   [place] [facility] at [facility], MD   6 months ago Preop examination   [place] Primary Care at [facility], MD   8 months ago Essential hypertension   [place] Primary Care at [facility], MD   9 months ago Essential hypertension   [place] Primary Care at [facility], MD   1 year ago Essential (primary) hypertension   [place] Primary Care at [facility], Amy J, NP               lisinopril (ZESTRIL) 40 MG tablet 90 tablet 0    Sig: TAKE ONE TABLET BY MOUTH ONE TIME DAILY     Cardiovascular:  ACE Inhibitors Passed - 08/15/2022  2:27 AM  Passed - Cr in normal range and within 180 days    Creatinine, Ser  Date Value Ref Range Status  05/22/2022 0.92 0.57 - 1.00 mg/dL Final         Passed - K in normal range and within  180 days    Potassium  Date Value Ref Range Status  05/22/2022 4.3 3.5 - 5.2 mmol/L Final         Passed - Patient is not pregnant      Passed - Last BP in normal range    BP Readings from Last 1 Encounters:  05/22/22 116/69         Passed - Valid encounter within last 6 months    Recent Outpatient Visits           2 months ago Annual physical exam   [place] [facility] at [facility], MD   6 months ago Preop examination   [place] [facility] at [facility], MD   8 months ago Essential hypertension   [place] Primary Care at [facility], MD   9 months ago Essential hypertension   [place] [facility] Care at [facility], MD   1 year ago Essential (primary) hypertension   [place] [facility] Care at [facility], Flonnie Hailstone, NP

## 2022-08-17 NOTE — Telephone Encounter (Signed)
Requested Prescriptions  Pending Prescriptions Disp Refills   cyclobenzaprine (FLEXERIL) 5 MG tablet [Pharmacy Med Name: CYCLOBENZAPRINE 5 MG TAB] 30 tablet 0    Sig: TAKE ONE TABLET BY MOUTH THREE TIMES A DAY AS NEEDED FOR MUSCLE SPASMS     Not Delegated - Analgesics:  Muscle Relaxants Failed - 08/15/2022  2:27 AM      Failed - This refill cannot be delegated      Passed - Valid encounter within last 6 months    Recent Outpatient Visits           2 months ago Annual physical exam   [place] [facility], MD   6 months ago Preop examination   [place] [facility], MD   8 months ago Essential hypertension   [place] [facility], MD   9 months ago Essential hypertension   [place] [facility], MD   1 year ago Essential (primary) hypertension   [place] [facility], Amy J, NP               potassium chloride (KLOR-CON) 10 MEQ tablet [Pharmacy Med Name: POTASSIUM CHLORIDE ER 10 MEQ TAB] 90 tablet 0    Sig: TAKE ONE TABLET BY MOUTH ONE TIME DAILY     Endocrinology:  Minerals - Potassium Supplementation Passed - 08/15/2022  2:27 AM      Passed - K in normal range and within 360 days    Potassium  Date Value Ref Range Status  05/22/2022 4.3 3.5 - 5.2 mmol/L Final         Passed - Cr in normal range and within 360 days    Creatinine, Ser  Date Value Ref Range Status  05/22/2022 0.92 0.57 - 1.00 mg/dL Final         Passed - Valid encounter within last 12 months    Recent Outpatient Visits           2 months ago Annual physical exam   [place] [facility], MD   6 months ago Preop examination   [place] [facility], MD   8 months ago Essential hypertension   [place] [facility], MD   9  months ago Essential hypertension   [place] [facility] at [facility], MD   1 year ago Essential (primary) hypertension   [place] [facility], Amy J, NP               amLODipine (NORVASC) 10 MG tablet [Pharmacy Med Name: AMLODIPINE 10 MG TAB[*]] 90 tablet 0    Sig: TAKE ONE TABLET BY MOUTH ONE TIME DAILY     Cardiovascular: Calcium Channel Blockers 2 Passed - 08/15/2022  2:27 AM      Passed - Last BP in normal range    BP Readings from Last 1 Encounters:  05/22/22 116/69         Passed - Last Heart Rate in normal range    Pulse Readings from Last 1 Encounters:  05/22/22 61         Passed - Valid encounter within last 6 months    Recent Outpatient Visits           2 months ago Annual physical  exam   [place] [facility], [facility]   6 months ago Preop examination   [place] [facility], MD   8 months ago Essential hypertension   [place] [facility], MD   9 months ago Essential hypertension   [place] [facility], MD   1 year ago Essential (primary) hypertension   [place] [facility], Amy J, NP               Vitamin D, Ergocalciferol, (DRISDOL) 1.25 MG (50000 UNIT) CAPS capsule [Pharmacy Med Name: VITAMIN D2 1.25 MG (50000 UNIT)[*]] 12 capsule 0    Sig: TAKE ONE CAPSULE BY MOUTH EVERY 7 DAYS     Endocrinology:  Vitamins - Vitamin D Supplementation 2 Failed - 08/15/2022  2:27 AM      Failed - Manual Review: Route requests for 50,000 IU strength to the provider      Failed - Vitamin D in normal range and within 360 days    Vit D, 25-Hydroxy  Date Value Ref Range Status  05/22/2022 14.9 (L) 30.0 - 100.0 ng/mL Final    Comment:    Vitamin D deficiency has been defined by the Institute of Medicine and an Endocrine Society practice guideline  as a level of serum 25-OH vitamin D less than 20 ng/mL (1,2). The Endocrine Society went on to further define vitamin D insufficiency as a level between 21 and 29 ng/mL (2). 1. IOM (Institute of Medicine). 2010. Dietary reference    intakes for calcium and D. [place]: The    [employer]. 2. Holick MF, Binkley , Bischoff-Ferrari HA, et al.    Evaluation, treatment, and prevention of vitamin D    deficiency: an Endocrine Society clinical practice    guideline. JCEM. 2011 Jul; 96(7):1911-30.          Passed - Ca in normal range and within 360 days    Calcium  Date Value Ref Range Status  05/22/2022 9.7 8.7 - 10.2 mg/dL Final   Calcium, Ion  Date Value Ref Range Status  01/29/2021 1.16 1.15 - 1.40 mmol/L Final         Passed - Valid encounter within last 12 months    Recent Outpatient Visits           2 months ago Annual physical exam   [place] [facility], MD   6 months ago Preop examination   [place] [facility], MD   8 months ago Essential hypertension   [place] [facility], MD   9 months ago Essential hypertension    [facility] at [facility], MD   1 year ago Essential (primary) hypertension    [facility] at [facility], Amy J, NP               lisinopril (ZESTRIL) 40 MG tablet [Pharmacy Med Name: LISINOPRIL 40 MG TAB[*]] 90 tablet 0    Sig: TAKE ONE TABLET BY MOUTH ONE TIME DAILY     Cardiovascular:  ACE Inhibitors Passed - 08/15/2022  2:27 AM      Passed - Cr in normal range and within 180 days    Creatinine, Ser  Date Value Ref Range Status  05/22/2022 0.92 0.57 -  1.00 mg/dL Final         Passed - K in normal range and within 180 days    Potassium  Date Value Ref Range Status  05/22/2022 4.3 3.5 - 5.2 mmol/L Final         Passed - Patient is not pregnant       Passed - Last BP in normal range    BP Readings from Last 1 Encounters:  05/22/22 116/69         Passed - Valid encounter within last 6 months    Recent Outpatient Visits           2 months ago Annual physical exam   [place] [facility], MD   6 months ago Preop examination   [place] [facility], MD   8 months ago Essential hypertension   [place] [facility], MD   9 months ago Essential hypertension   [place] [facility], MD   1 year ago Essential (primary) hypertension   [place] [facility] at [facility], Flonnie Hailstone, NP

## 2022-08-18 ENCOUNTER — Other Ambulatory Visit: Payer: Self-pay | Admitting: Family Medicine

## 2022-08-18 DIAGNOSIS — I10 Essential (primary) hypertension: Secondary | ICD-10-CM

## 2022-09-13 ENCOUNTER — Other Ambulatory Visit: Payer: Self-pay | Admitting: Family Medicine

## 2022-09-30 ENCOUNTER — Other Ambulatory Visit: Payer: Commercial Managed Care - HMO

## 2022-09-30 ENCOUNTER — Ambulatory Visit: Payer: Commercial Managed Care - HMO | Admitting: Surgery

## 2022-11-06 ENCOUNTER — Other Ambulatory Visit: Payer: Self-pay | Admitting: Family Medicine

## 2022-11-09 NOTE — Telephone Encounter (Signed)
Requested medication (s) are due for refill today: Yes  Requested medication (s) are on the active medication list: Yes  Last refill:  05/25/22  Future visit scheduled: No  Notes to clinic:  See request.    Requested Prescriptions  Pending Prescriptions Disp Refills   Vitamin D, Ergocalciferol, (DRISDOL) 1.25 MG (50000 UNIT) CAPS capsule [Pharmacy Med Name: VITAMIN D2 1.25 MG (50000 UNIT)[*]] 12 capsule 0    Sig: TAKE ONE CAPSULE BY MOUTH EVERY 7 DAYS     Endocrinology:  Vitamins - Vitamin D Supplementation 2 Failed - 11/06/2022  6:42 PM      Failed - Manual Review: Route requests for 50,000 IU strength to the provider      Failed - Vitamin D in normal range and within 360 days    Vit D, 25-Hydroxy  Date Value Ref Range Status  05/22/2022 14.9 (L) 30.0 - 100.0 ng/mL Final    Comment:    Vitamin D deficiency has been defined by the Institute of Medicine and an Endocrine Society practice guideline as a level of serum 25-OH vitamin D less than 20 ng/mL (1,2). The Endocrine Society went on to further define vitamin D insufficiency as a level between 21 and 29 ng/mL (2). 1. IOM (Institute of Medicine). 2010. Dietary reference    intakes for calcium and D. [place]: The    [employer]. 2. Holick MF, Binkley Boykin, Bischoff-Ferrari HA, et al.    Evaluation, treatment, and prevention of vitamin D    deficiency: an Endocrine Society clinical practice    guideline. JCEM. 2011 Jul; 96(7):1911-30.          Passed - Ca in normal range and within 360 days    Calcium  Date Value Ref Range Status  05/22/2022 9.7 8.7 - 10.2 mg/dL Final   Calcium, Ion  Date Value Ref Range Status  01/29/2021 1.16 1.15 - 1.40 mmol/L Final         Passed - Valid encounter within last 12 months    Recent Outpatient Visits           5 months ago Annual physical exam   [place] [facility], MD   9 months ago Preop examination   [place] [facility], MD   11 months ago Essential hypertension   [place] [facility], MD   12 months ago Essential hypertension   [place] [facility] at [facility], MD   1 year ago Essential (primary) hypertension   [place] [facility], Salomon Fick, NP

## 2022-11-12 ENCOUNTER — Other Ambulatory Visit: Payer: Self-pay | Admitting: Family Medicine

## 2022-11-12 DIAGNOSIS — I10 Essential (primary) hypertension: Secondary | ICD-10-CM

## 2022-11-12 DIAGNOSIS — Z95828 Presence of other vascular implants and grafts: Secondary | ICD-10-CM

## 2022-11-12 NOTE — Telephone Encounter (Signed)
Requested Prescriptions  Pending Prescriptions Disp Refills   simvastatin (ZOCOR) 20 MG tablet [Pharmacy Med Name: SIMVASTATIN 20 MG TAB[*]] 90 tablet 0    Sig: TAKE ONE TABLET BY MOUTH ONE TIME DAILY     Cardiovascular:  Antilipid - Statins Failed - 11/12/2022  9:32 AM      Failed - Lipid Panel in normal range within the last 12 months    Cholesterol, Total  Date Value Ref Range Status  05/22/2022 176 100 - 199 mg/dL Final   LDL Chol Calc (NIH)  Date Value Ref Range Status  05/22/2022 103 (H) 0 - 99 mg/dL Final   LDL Direct  Date Value Ref Range Status  05/09/2020 99 0 - 99 mg/dL Final   HDL  Date Value Ref Range Status  05/22/2022 46 >39 mg/dL Final   Triglycerides  Date Value Ref Range Status  05/22/2022 151 (H) 0 - 149 mg/dL Final         Passed - Patient is not pregnant      Passed - Valid encounter within last 12 months    Recent Outpatient Visits           5 months ago Annual physical exam   [place] [facility], MD   9 months ago Preop examination   [place] [facility] Lauris [facility], MD   11 months ago Essential hypertension   [place] [facility], MD   1 year ago Essential hypertension   [place] [facility], MD   1 year ago Essential (primary) hypertension   [place] [facility], Amy J, NP               carvedilol (COREG) 25 MG tablet [Pharmacy Med Name: CARVEDILOL 25 MG TAB[*]] 180 tablet 0    Sig: TAKE ONE TABLET BY MOUTH TWICE A DAY WITH A MEAL     Cardiovascular: Beta Blockers 3 Passed - 11/12/2022  9:32 AM      Passed - Cr in normal range and within 360 days    Creatinine, Ser  Date Value Ref Range Status  05/22/2022 0.92 0.57 - 1.00 mg/dL Final         Passed - AST in normal range and within 360 days    AST  Date Value Ref Range Status  05/22/2022 16 0 - 40 IU/L Final          Passed - ALT in normal range and within 360 days    ALT  Date Value Ref Range Status  05/22/2022 20 0 - 32 IU/L Final         Passed - Last BP in normal range    BP Readings from Last 1 Encounters:  05/22/22 116/69         Passed - Last Heart Rate in normal range    Pulse Readings from Last 1 Encounters:  05/22/22 61         Passed - Valid encounter within last 6 months    Recent Outpatient Visits           5 months ago Annual physical exam   [place] [facility], MD   9 months ago Preop examination   [place] [facility], MD   11 months ago Essential hypertension   [place] [facility], MD   1 year ago Essential hypertension   [place] [facility] at [facility], MD   1 year ago Essential (primary) hypertension   [place] [facility], Salomon Fick, NP

## 2022-11-13 NOTE — Telephone Encounter (Signed)
Refilled 11/12/22 #90. Requested Prescriptions  Signed Prescriptions Disp Refills   potassium chloride (KLOR-CON) 10 MEQ tablet 90 tablet 0    Sig: TAKE ONE TABLET BY MOUTH ONE TIME DAILY     Endocrinology:  Minerals - Potassium Supplementation Passed - 11/12/2022  6:44 PM      Passed - K in normal range and within 360 days    Potassium  Date Value Ref Range Status  05/22/2022 4.3 3.5 - 5.2 mmol/L Final         Passed - Cr in normal range and within 360 days    Creatinine, Ser  Date Value Ref Range Status  05/22/2022 0.92 0.57 - 1.00 mg/dL Final         Passed - Valid encounter within last 12 months    Recent Outpatient Visits           5 months ago Annual physical exam   [place] [facility], MD   9 months ago Preop examination   [place] [facility] at [facility] Lauris Poag, MD   11 months ago Essential hypertension   [place] [facility] at [facility], MD   1 year ago Essential hypertension   [place] [facility] at [facility], MD   1 year ago Essential (primary) hypertension   [place] [facility], Amy J, NP               hydrochlorothiazide (HYDRODIURIL) 25 MG tablet 90 tablet 1    Sig: TAKE ONE TABLET BY MOUTH ONE TIME DAILY     Cardiovascular: Diuretics - Thiazide Passed - 11/12/2022  6:44 PM      Passed - Cr in normal range and within 180 days    Creatinine, Ser  Date Value Ref Range Status  05/22/2022 0.92 0.57 - 1.00 mg/dL Final         Passed - K in normal range and within 180 days    Potassium  Date Value Ref Range Status  05/22/2022 4.3 3.5 - 5.2 mmol/L Final         Passed - Na in normal range and within 180 days    Sodium  Date Value Ref Range Status  05/22/2022 140 134 - 144 mmol/L Final         Passed - Last BP in normal range    BP Readings from Last 1 Encounters:  05/22/22 116/69         Passed - Valid encounter  within last 6 months    Recent Outpatient Visits           5 months ago Annual physical exam   [place] [facility], MD   9 months ago Preop examination   [place] [facility], MD   11 months ago Essential hypertension   [place] [facility], MD   1 year ago Essential hypertension   [place] [facility] at [facility], MD   1 year ago Essential (primary) hypertension   [place] [facility] [place], NP              Refused Prescriptions Disp Refills   simvastatin (ZOCOR) 20 MG tablet [Pharmacy Med Name: SIMVASTATIN 20 MG TAB[*]] 90 tablet 0    Sig: TAKE ONE TABLET BY MOUTH  ONE TIME DAILY     Cardiovascular:  Antilipid - Statins Failed - 11/12/2022  6:44 PM      Failed - Lipid Panel in normal range within the last 12 months    Cholesterol, Total  Date Value Ref Range Status  05/22/2022 176 100 - 199 mg/dL Final   LDL Chol Calc (NIH)  Date Value Ref Range Status  05/22/2022 103 (H) 0 - 99 mg/dL Final   LDL Direct  Date Value Ref Range Status  05/09/2020 99 0 - 99 mg/dL Final   HDL  Date Value Ref Range Status  05/22/2022 46 >39 mg/dL Final   Triglycerides  Date Value Ref Range Status  05/22/2022 151 (H) 0 - 149 mg/dL Final         Passed - Patient is not pregnant      Passed - Valid encounter within last 12 months    Recent Outpatient Visits           5 months ago Annual physical exam   [place] [facility], MD   9 months ago Preop examination   [place] [facility], MD   11 months ago Essential hypertension   [place] [facility] at [facility], MD   1 year ago Essential hypertension   [place] [facility], MD   1 year ago Essential (primary) hypertension   [facility], Salomon Fick, NP

## 2022-11-13 NOTE — Telephone Encounter (Signed)
Requested Prescriptions  Pending Prescriptions Disp Refills   potassium chloride (KLOR-CON) 10 MEQ tablet [Pharmacy Med Name: POTASSIUM CHLORIDE ER 10 MEQ TAB] 90 tablet 0    Sig: TAKE ONE TABLET BY MOUTH ONE TIME DAILY     Endocrinology:  Minerals - Potassium Supplementation Passed - 11/12/2022  6:44 PM      Passed - K in normal range and within 360 days    Potassium  Date Value Ref Range Status  05/22/2022 4.3 3.5 - 5.2 mmol/L Final         Passed - Cr in normal range and within 360 days    Creatinine, Ser  Date Value Ref Range Status  05/22/2022 0.92 0.57 - 1.00 mg/dL Final         Passed - Valid encounter within last 12 months    Recent Outpatient Visits           5 months ago Annual physical exam   [place] [facility], MD   9 months ago Preop examination   [place] [facility], MD   11 months ago Essential hypertension   [place] [facility], MD   1 year ago Essential hypertension   [place] [facility] at [facility], MD   1 year ago Essential (primary) hypertension   [place] [facility], Amy J, NP               simvastatin (ZOCOR) 20 MG tablet [Pharmacy Med Name: SIMVASTATIN 20 MG TAB[*]] 90 tablet 0    Sig: TAKE ONE TABLET BY MOUTH ONE TIME DAILY     Cardiovascular:  Antilipid - Statins Failed - 11/12/2022  6:44 PM      Failed - Lipid Panel in normal range within the last 12 months    Cholesterol, Total  Date Value Ref Range Status  05/22/2022 176 100 - 199 mg/dL Final   LDL Chol Calc (NIH)  Date Value Ref Range Status  05/22/2022 103 (H) 0 - 99 mg/dL Final   LDL Direct  Date Value Ref Range Status  05/09/2020 99 0 - 99 mg/dL Final   HDL  Date Value Ref Range Status  05/22/2022 46 >39 mg/dL Final   Triglycerides  Date Value Ref Range Status  05/22/2022 151 (H) 0 - 149 mg/dL Final          Passed - Patient is not pregnant      Passed - Valid encounter within last 12 months    Recent Outpatient Visits           5 months ago Annual physical exam   [place] [facility], MD   9 months ago Preop examination   [place] [facility], MD   11 months ago Essential hypertension   [place] [facility], MD   1 year ago Essential hypertension   [place] [facility], MD   1 year ago Essential (primary) hypertension   [place] [facility] at [facility], Amy J, NP               hydrochlorothiazide (HYDRODIURIL) 25 MG tablet [Pharmacy Med Name: HYDROCHLOROTHIAZIDE 25 MG TAB[*]] 90 tablet 1    Sig: TAKE ONE TABLET BY MOUTH ONE TIME DAILY  Cardiovascular: Diuretics - Thiazide Passed - 11/12/2022  6:44 PM      Passed - Cr in normal range and within 180 days    Creatinine, Ser  Date Value Ref Range Status  05/22/2022 0.92 0.57 - 1.00 mg/dL Final         Passed - K in normal range and within 180 days    Potassium  Date Value Ref Range Status  05/22/2022 4.3 3.5 - 5.2 mmol/L Final         Passed - Na in normal range and within 180 days    Sodium  Date Value Ref Range Status  05/22/2022 140 134 - 144 mmol/L Final         Passed - Last BP in normal range    BP Readings from Last 1 Encounters:  05/22/22 116/69         Passed - Valid encounter within last 6 months    Recent Outpatient Visits           5 months ago Annual physical exam   [place] [facility], MD   9 months ago Preop examination   [place] [facility], MD   11 months ago Essential hypertension   [place] [facility] MD   1 year ago Essential hypertension   [place] [facility] at [facility], MD   1  year ago Essential (primary) hypertension   [place] [facility] at [facility], Salomon Fick, NP

## 2023-02-10 ENCOUNTER — Other Ambulatory Visit: Payer: Self-pay | Admitting: Family Medicine

## 2023-02-10 DIAGNOSIS — Z95828 Presence of other vascular implants and grafts: Secondary | ICD-10-CM

## 2023-02-10 DIAGNOSIS — I10 Essential (primary) hypertension: Secondary | ICD-10-CM

## 2023-02-12 ENCOUNTER — Other Ambulatory Visit: Payer: Self-pay | Admitting: Family Medicine

## 2023-02-12 DIAGNOSIS — Z95828 Presence of other vascular implants and grafts: Secondary | ICD-10-CM

## 2023-02-12 DIAGNOSIS — I10 Essential (primary) hypertension: Secondary | ICD-10-CM

## 2023-02-12 NOTE — Telephone Encounter (Signed)
Medication Refill - Medication: amLODipine (NORVASC) 10 MG tablet , carvedilol (COREG) 25 MG tablet , levETIRAcetam (KEPPRA) 500 MG tablet , lisinopril (ZESTRIL) 40 MG tablet ,  potassium chloride (KLOR-CON) 10 MEQ tablet , simvastatin (ZOCOR) 20 MG tablet  Has the patient contacted their pharmacy? Yes.   (She was told to call her dr b/c she needs appt.   Preferred Pharmacy (with phone number or street name): [facility] [address] [place], [place] - [address] [place]. AT [facility] [address]  Has the patient been seen for an appointment in the last year OR does the patient have an upcoming appointment? Yes.    Pt is out of medication and hopes to get today please.

## 2023-02-15 MED ORDER — LISINOPRIL 40 MG PO TABS
40.0000 mg | ORAL_TABLET | Freq: Every day | ORAL | 0 refills | Status: DC
Start: 2023-02-15 — End: 2023-05-13

## 2023-02-15 MED ORDER — AMLODIPINE BESYLATE 10 MG PO TABS
10.0000 mg | ORAL_TABLET | Freq: Every day | ORAL | 0 refills | Status: DC
Start: 2023-02-15 — End: 2023-06-14

## 2023-02-15 MED ORDER — CARVEDILOL 25 MG PO TABS
ORAL_TABLET | ORAL | 0 refills | Status: DC
Start: 2023-02-15 — End: 2023-05-13

## 2023-02-15 MED ORDER — POTASSIUM CHLORIDE ER 10 MEQ PO TBCR
10.0000 meq | EXTENDED_RELEASE_TABLET | Freq: Every day | ORAL | 0 refills | Status: AC
Start: 2023-02-15 — End: ?

## 2023-02-15 MED ORDER — SIMVASTATIN 20 MG PO TABS
20.0000 mg | ORAL_TABLET | Freq: Every day | ORAL | 0 refills | Status: DC
Start: 2023-02-15 — End: 2023-05-13

## 2023-02-15 NOTE — Telephone Encounter (Signed)
Pt. Has appointment. Requested Prescriptions  Pending Prescriptions Disp Refills   amLODipine (NORVASC) 10 MG tablet 30 tablet 0    Sig: Take 1 tablet (10 mg total) by mouth daily.     Cardiovascular: Calcium Channel Blockers 2 Failed - 02/12/2023  1:43 PM      Failed - Valid encounter within last 6 months    Recent Outpatient Visits           8 months ago Annual physical exam   [place] [facility], MD   1 year ago Preop examination   [place] [facility], MD   1 year ago Essential hypertension   [place] [facility], MD   1 year ago Essential hypertension   [place] [facility], MD   1 year ago Essential (primary) hypertension   [place] [facility] at [facility], [place], NP       Future Appointments             In 2 weeks Georganna Skeans, MD [facility] at [facility] - Last BP in normal range    BP Readings from Last 1 Encounters:  05/22/22 116/69         Passed - Last Heart Rate in normal range    Pulse Readings from Last 1 Encounters:  05/22/22 61          carvedilol (COREG) 25 MG tablet 180 tablet 0    Sig: TAKE ONE TABLET BY MOUTH TWICE A DAY WITH A MEAL     Cardiovascular: Beta Blockers 3 Failed - 02/12/2023  1:43 PM      Failed - Valid encounter within last 6 months    Recent Outpatient Visits           8 months ago Annual physical exam   [place] [facility], MD   1 year ago Preop examination   [place] [facility], MD   1 year ago Essential hypertension   [place] [facility] at [facility], MD   1 year ago Essential hypertension   [place] [facility] at [facility], MD   1 year ago Essential (primary) hypertension   [place] [facility] at [facility], [place], NP       Future Appointments             In 2 weeks Georganna Skeans, MD [facility] - Cr in normal range and within 360 days    Creatinine, Ser  Date Value Ref Range Status  05/22/2022 0.92 0.57 - 1.00 mg/dL Final         Passed - AST in normal range and within 360 days    AST  Date Value Ref Range Status  05/22/2022 16 0 - 40 IU/L Final         Passed - ALT in normal range and within 360 days    ALT  Date Value Ref Range Status  05/22/2022 20 0 - 32 IU/L Final         Passed - Last BP in normal range    BP  Readings from Last 1 Encounters:  05/22/22 116/69         Passed - Last Heart Rate in normal range    Pulse Readings from Last 1 Encounters:  05/22/22 61          levETIRAcetam (KEPPRA) 500 MG tablet 180 tablet 3    Sig: Take 1 tablet (500 mg total) by mouth 2 (two) times daily.     Neurology:  Anticonvulsants - levetiracetam Passed - 02/12/2023  1:43 PM      Passed - Cr in normal range and within 360 days    Creatinine, Ser  Date Value Ref Range Status  05/22/2022 0.92 0.57 - 1.00 mg/dL Final         Passed - Completed PHQ-2 or PHQ-9 in the last 360 days      Passed - Valid encounter within last 12 months    Recent Outpatient Visits           8 months ago Annual physical exam   [place] [facility], MD   1 year ago Preop examination   [place] [facility], MD   1 year ago Essential hypertension   [place] [facility] at [facility], MD   1 year ago Essential hypertension   [place] [facility], MD   1 year ago Essential (primary) hypertension   [place] [facility], [place], NP       Future Appointments             In 2 weeks Georganna Skeans, MD [facility] at [facility]              lisinopril (ZESTRIL) 40 MG tablet 90 tablet 0    Sig: Take 1 tablet (40 mg total) by mouth daily.     Cardiovascular:  ACE Inhibitors Failed - 02/12/2023  1:43 PM      Failed - Cr in normal range and within 180 days    Creatinine, Ser  Date Value Ref Range Status  05/22/2022 0.92 0.57 - 1.00 mg/dL Final         Failed - K in normal range and within 180 days    Potassium  Date Value Ref Range Status  05/22/2022 4.3 3.5 - 5.2 mmol/L Final         Failed - Valid encounter within last 6 months    Recent Outpatient Visits           8 months ago Annual physical exam   [place] [facility], MD   1 year ago Preop examination   [place] [facility], MD   1 year ago Essential hypertension   [place] [facility] at [facility], MD   1 year ago Essential hypertension   [place] [facility] at [facility], MD   1 year ago Essential (primary) hypertension   [place] [facility] at [facility], [place], [place]       Future Appointments             In 2 weeks Georganna Skeans, MD [facility] at [facility] - Patient is not pregnant      Passed - Last BP  in normal range    BP Readings from Last 1 Encounters:  05/22/22 116/69          potassium chloride (KLOR-CON) 10 MEQ tablet 90 tablet 0    Sig: Take 1 tablet (10 mEq total) by mouth daily.     Endocrinology:  Minerals - Potassium Supplementation Passed - 02/12/2023  1:43 PM      Passed - K in normal range and within 360 days    Potassium  Date Value Ref Range Status  05/22/2022 4.3 3.5 - 5.2 mmol/L Final         Passed - Cr in normal range and within 360 days    Creatinine, Ser  Date Value Ref Range Status  05/22/2022 0.92 0.57 - 1.00 mg/dL Final         Passed - Valid encounter within last 12 months    Recent Outpatient Visits           8  months ago Annual physical exam   [place] [facility], MD   1 year ago Preop examination   [place] [facility], MD   1 year ago Essential hypertension   [place] [facility], MD   1 year ago Essential hypertension   [place] [facility] at [facility], MD   1 year ago Essential (primary) hypertension   [place] [facility] at [facility], [place], NP       Future Appointments             In 2 weeks Georganna Skeans, MD [facility]             simvastatin (ZOCOR) 20 MG tablet 90 tablet 0    Sig: Take 1 tablet (20 mg total) by mouth daily.     Cardiovascular:  Antilipid - Statins Failed - 02/12/2023  1:43 PM      Failed - Lipid Panel in normal range within the last 12 months    Cholesterol, Total  Date Value Ref Range Status  05/22/2022 176 100 - 199 mg/dL Final   LDL Chol Calc (NIH)  Date Value Ref Range Status  05/22/2022 103 (H) 0 - 99 mg/dL Final   LDL Direct  Date Value Ref Range Status  05/09/2020 99 0 - 99 mg/dL Final   HDL  Date Value Ref Range Status  05/22/2022 46 >39 mg/dL Final   Triglycerides  Date Value Ref Range Status  05/22/2022 151 (H) 0 - 149 mg/dL Final         Passed - Patient is not pregnant      Passed - Valid encounter within last 12 months    Recent Outpatient Visits           8 months ago Annual physical exam   [place] [facility], MD   1 year ago Preop examination   [place] [facility], MD   1 year ago Essential hypertension   [place] [facility], MD   1 year ago Essential hypertension   [place] [facility] at [facility], MD   1 year ago Essential (primary) hypertension    Primary [facility] at [facility], [place], NP       Future Appointments  In 2 weeks Georganna Skeans, MD [facility] at [facility]

## 2023-02-15 NOTE — Telephone Encounter (Signed)
Requested medication (s) are due for refill today: Yes  Requested medication (s) are on the active medication list: Yes  Last refill:  02/06/22  Future visit scheduled: Yes  Notes to clinic:  Last filled by Dr. Karel Jarvis.    Requested Prescriptions  Pending Prescriptions Disp Refills   levETIRAcetam (KEPPRA) 500 MG tablet 180 tablet 3    Sig: Take 1 tablet (500 mg total) by mouth 2 (two) times daily.     Neurology:  Anticonvulsants - levetiracetam Passed - 02/12/2023  1:43 PM      Passed - Cr in normal range and within 360 days    Creatinine, Ser  Date Value Ref Range Status  05/22/2022 0.92 0.57 - 1.00 mg/dL Final         Passed - Completed PHQ-2 or PHQ-9 in the last 360 days      Passed - Valid encounter within last 12 months    Recent Outpatient Visits           8 months ago Annual physical exam   [place] [facility], MD   1 year ago Preop examination   [place] [facility], MD   1 year ago Essential hypertension   [place] [facility] at [facility], MD   1 year ago Essential hypertension   [place] [facility] at [facility], MD   1 year ago Essential (primary) hypertension   [place] [facility] [place], [place]       Future Appointments             In 2 weeks Georganna Skeans, MD [facility] at [facility]            Signed Prescriptions Disp Refills   amLODipine (NORVASC) 10 MG tablet 30 tablet 0    Sig: Take 1 tablet (10 mg total) by mouth daily.     Cardiovascular: Calcium Channel Blockers 2 Failed - 02/12/2023  1:43 PM      Failed - Valid encounter within last 6 months    Recent Outpatient Visits           8 months ago Annual physical exam   [place] [facility], MD   1 year ago Preop examination   [place] [facility],  MD   1 year ago Essential hypertension   [place] [facility] at [facility], MD   1 year ago Essential hypertension   [place] [facility] at [facility], MD   1 year ago Essential (primary) hypertension    [facility] at [facility], [place], NP       Future Appointments             In 2 weeks Georganna Skeans, MD [facility] - Last BP in normal range    BP Readings from Last 1 Encounters:  05/22/22 116/69         Passed - Last Heart Rate in normal range    Pulse Readings from Last 1 Encounters:  05/22/22 61          carvedilol (COREG) 25 MG tablet 180 tablet 0    Sig: TAKE ONE TABLET BY MOUTH TWICE A  DAY WITH A MEAL     Cardiovascular: Beta Blockers 3 Failed - 02/12/2023  1:43 PM      Failed - Valid encounter within last 6 months    Recent Outpatient Visits           8 months ago Annual physical exam   [place] [facility] at [facility], MD   1 year ago Preop examination   [place] [facility] at [facility], MD   1 year ago Essential hypertension   [place] [facility], MD   1 year ago Essential hypertension   [place] [facility] at [facility], MD   1 year ago Essential (primary) hypertension   [place] [facility] at [facility], [place], [place]       Future Appointments             In 2 weeks Georganna Skeans, MD [facility] at [facility] - Cr in normal range and within 360 days    Creatinine, Ser  Date Value Ref Range Status  05/22/2022 0.92 0.57 - 1.00 mg/dL Final         Passed - AST in normal range and within 360 days    AST  Date Value Ref Range Status  05/22/2022 16 0 - 40 IU/L Final         Passed - ALT in normal range and within 360 days    ALT  Date Value Ref Range Status   05/22/2022 20 0 - 32 IU/L Final         Passed - Last BP in normal range    BP Readings from Last 1 Encounters:  05/22/22 116/69         Passed - Last Heart Rate in normal range    Pulse Readings from Last 1 Encounters:  05/22/22 61          lisinopril (ZESTRIL) 40 MG tablet 90 tablet 0    Sig: Take 1 tablet (40 mg total) by mouth daily.     Cardiovascular:  ACE Inhibitors Failed - 02/12/2023  1:43 PM      Failed - Cr in normal range and within 180 days    Creatinine, Ser  Date Value Ref Range Status  05/22/2022 0.92 0.57 - 1.00 mg/dL Final         Failed - K in normal range and within 180 days    Potassium  Date Value Ref Range Status  05/22/2022 4.3 3.5 - 5.2 mmol/L Final         Failed - Valid encounter within last 6 months    Recent Outpatient Visits           8 months ago Annual physical exam   [place] [facility], MD   1 year ago Preop examination   [place] [facility] at [facility], MD   1 year ago Essential hypertension   [place] [facility] at [facility], MD   1 year ago Essential hypertension   [place] [facility] at [facility], MD   1 year ago Essential (primary) hypertension   [place] [facility] at [facility], [place], NP       Future Appointments  In 2 weeks Georganna Skeans, MD [facility] - Patient is not pregnant      Passed - Last BP in normal range    BP Readings from Last 1 Encounters:  05/22/22 116/69          potassium chloride (KLOR-CON) 10 MEQ tablet 90 tablet 0    Sig: Take 1 tablet (10 mEq total) by mouth daily.     Endocrinology:  Minerals - Potassium Supplementation Passed - 02/12/2023  1:43 PM      Passed - K in normal range and within 360 days    Potassium  Date Value Ref Range Status  05/22/2022 4.3 3.5 - 5.2 mmol/L Final          Passed - Cr in normal range and within 360 days    Creatinine, Ser  Date Value Ref Range Status  05/22/2022 0.92 0.57 - 1.00 mg/dL Final         Passed - Valid encounter within last 12 months    Recent Outpatient Visits           8 months ago Annual physical exam   [place] [facility], MD   1 year ago Preop examination   [place] [facility], MD   1 year ago Essential hypertension   [place] [facility] at [facility], MD   1 year ago Essential hypertension   [place] [facility] at [facility], MD   1 year ago Essential (primary) hypertension   [place] [facility] at [facility], [place], NP       Future Appointments             In 2 weeks Georganna Skeans, MD [facility] at [facility]             simvastatin (ZOCOR) 20 MG tablet 90 tablet 0    Sig: Take 1 tablet (20 mg total) by mouth daily.     Cardiovascular:  Antilipid - Statins Failed - 02/12/2023  1:43 PM      Failed - Lipid Panel in normal range within the last 12 months    Cholesterol, Total  Date Value Ref Range Status  05/22/2022 176 100 - 199 mg/dL Final   LDL Chol Calc (NIH)  Date Value Ref Range Status  05/22/2022 103 (H) 0 - 99 mg/dL Final   LDL Direct  Date Value Ref Range Status  05/09/2020 99 0 - 99 mg/dL Final   HDL  Date Value Ref Range Status  05/22/2022 46 >39 mg/dL Final   Triglycerides  Date Value Ref Range Status  05/22/2022 151 (H) 0 - 149 mg/dL Final         Passed - Patient is not pregnant      Passed - Valid encounter within last 12 months    Recent Outpatient Visits           8 months ago Annual physical exam   [place] [facility], MD   1 year ago Preop examination   [place] [facility] at [facility], MD   1 year ago Essential hypertension   [place] [facility] at [facility], MD   1 year ago Essential hypertension   [place]  [facility] at [facility], MD   1 year ago Essential (primary) hypertension   [place] [facility], [place], NP       Future Appointments             In 2 weeks Georganna Skeans, MD [facility] Care at [facility]

## 2023-03-05 ENCOUNTER — Encounter: Payer: Self-pay | Admitting: Family Medicine

## 2023-03-05 ENCOUNTER — Ambulatory Visit: Payer: Commercial Managed Care - HMO | Admitting: Family Medicine

## 2023-03-05 VITALS — BP 123/73 | HR 55 | Temp 98.7°F | Resp 18 | Ht 66.0 in | Wt 203.6 lb

## 2023-03-05 DIAGNOSIS — R252 Cramp and spasm: Secondary | ICD-10-CM

## 2023-03-05 DIAGNOSIS — I10 Essential (primary) hypertension: Secondary | ICD-10-CM | POA: Diagnosis not present

## 2023-03-05 DIAGNOSIS — Z0289 Encounter for other administrative examinations: Secondary | ICD-10-CM

## 2023-03-05 NOTE — Progress Notes (Unsigned)
Follow up medication refills, handicap parking, sweating all day.  BPMon patient smart watch is going down at night to 45 or 46.  Cramps in legs and feet

## 2023-03-09 ENCOUNTER — Encounter: Payer: Self-pay | Admitting: Family Medicine

## 2023-03-09 NOTE — Progress Notes (Signed)
Established Patient Office Visit  Subjective    Patient ID: Ashley Guerrero, female    DOB: 1965/07/07  Age: 57 y.o. MRN: [id number]  CC:  Chief Complaint  Patient presents with   Follow-up    Medication refills, handicap parking, sweating all day, BPM on patient smart watch is going down at night to 45 or 46. Cramps in  legs and feet    HPI [employer] presents for follow up of chronic med issues. She is also requesting completion of forms and reports intermittent leg cramping, however she does sweat profusely on a daily basis.    Outpatient Encounter Medications as of 03/05/2023  Medication Sig   acetaminophen (TYLENOL) 500 MG tablet Take 500 mg by mouth every 6 (six) hours as needed for mild pain.   amLODipine (NORVASC) 10 MG tablet Take 1 tablet (10 mg total) by mouth daily.   aspirin EC 81 MG tablet Take 1 tablet (81 mg total) by mouth daily.   carvedilol (COREG) 25 MG tablet TAKE ONE TABLET BY MOUTH TWICE A DAY WITH A MEAL   cyclobenzaprine (FLEXERIL) 5 MG tablet TAKE ONE TABLET BY MOUTH THREE TIMES A DAY AS NEEDED FOR MUSCLE SPASMS   furosemide (LASIX) 20 MG tablet Take 1 tablet (20 mg total) by mouth as needed for edema.   hydrochlorothiazide (HYDRODIURIL) 25 MG tablet TAKE ONE TABLET BY MOUTH ONE TIME DAILY   levETIRAcetam (KEPPRA) 500 MG tablet Take 1 tablet (500 mg total) by mouth 2 (two) times daily.   lisinopril (ZESTRIL) 40 MG tablet Take 1 tablet (40 mg total) by mouth daily.   potassium chloride (KLOR-CON) 10 MEQ tablet Take 1 tablet (10 mEq total) by mouth daily.   simvastatin (ZOCOR) 20 MG tablet Take 1 tablet (20 mg total) by mouth daily.   traMADol (ULTRAM) 50 MG tablet Take 50 mg by mouth every 6 (six) hours as needed for moderate pain.   Vitamin D, Ergocalciferol, (DRISDOL) 1.25 MG (50000 UNIT) CAPS capsule Take 1 capsule (50,000 Units total) by mouth every 7 (seven) days.   No facility-administered encounter medications on file as of 03/05/2023.     Past Medical History:  Diagnosis Date   Aortic dissection (HCC)    Status post repair 5/16   Chronic diastolic CHF (congestive heart failure) (HCC)    Echo 6/16:  Severe LVH, EF 60-65%, no RWMA, Gr 1 DD, LA upper limits of normal, mild RAE, aneurysmal intra-atrial septum   Fibroid uterus    Hypertension     Past Surgical History:  Procedure Laterality Date   REPLACEMENT ASCENDING AORTA N/A 11/03/2014   Procedure: REPLACEMENT ASCENDING AORTA with a 30mm hemashield graft,  resuspension of aortic valve, hypothermic circulatory arrest and cardiopulmonary bypass.;  Surgeon: Delight Ovens, MD;  Location: [facility] OR;  Service: Open Heart Surgery;  Laterality: N/A;    Family History  Problem Relation Age of Onset   Hypertension Mother    Breast cancer Mother    Brain cancer Mother    Stroke Father    Heart failure Father    Hypertension Father    Diabetes Father    Hyperlipidemia Father    Hypertension Sister    Hypertension Brother    Cancer Sister    Breast cancer Maternal Aunt    Breast cancer Paternal Aunt    Breast cancer Maternal Grandmother    Breast cancer Paternal Grandmother    Heart attack Neg Hx     Social History   Socioeconomic History  Marital status: Single    Spouse name: Not on file   Number of children: 0   Years of education: Not on file   Highest education level: Some college, no degree  Occupational History   Not on file  Tobacco Use   Smoking status: Some Days    Current packs/day: 1.00    Average packs/day: 1 pack/day for 8.3 years (8.3 ttl pk-yrs)    Types: Cigarettes    Start date: 11/03/2014   Smokeless tobacco: Never   Tobacco comments:    5 cig a week  Vaping Use   Vaping status: Never Used  Substance and Sexual Activity   Alcohol use: Not Currently   Drug use: Not Currently   Sexual activity: Not Currently  Other Topics Concern   Not on file  Social History Narrative   Right handed    Live with self in a one level home    Caffeine rare   Social Determinants of Health   Financial Resource Strain: Not on file  Food Insecurity: Not on file  Transportation Needs: No Transportation Needs (03/02/2019)   PRAPARE - Administrator, Civil Service (Medical): No    Lack of Transportation (Non-Medical): No  Physical Activity: Not on file  Stress: Not on file  Social Connections: Not on file  Intimate Partner Violence: Not on file    Review of Systems  All other systems reviewed and are negative.       Objective    BP 123/73 (BP Location: Right Arm, Patient Position: Sitting, Cuff Size: Large)   Pulse (!) 55   Temp 98.7 F (37.1 C) (Oral)   Resp 18   Ht 5\' 6"  (1.676 m)   Wt 203 lb 9.6 oz (92.4 kg)   LMP 10/29/2014   SpO2 95%   BMI 32.86 kg/m   Physical Exam Vitals and nursing note reviewed.  Constitutional:      General: She is not in acute distress. Cardiovascular:     Rate and Rhythm: Normal rate and regular rhythm.  Pulmonary:     Effort: Pulmonary effort is normal.     Breath sounds: Normal breath sounds.  Abdominal:     Palpations: Abdomen is soft.     Tenderness: There is no abdominal tenderness.  Neurological:     General: No focal deficit present.     Mental Status: She is alert and oriented to person, place, and time.         Assessment & Plan:   1. Essential hypertension Appears stable. continue  2. Leg cramps Recommend adequate hydration and replacement fluids as patient is on lasix  3. Encounter for completion of form with patient Form completed for handicapped placard    Return in about 3 months (around 06/05/2023).   Tommie Raymond, MD

## 2023-03-24 ENCOUNTER — Other Ambulatory Visit: Payer: Self-pay | Admitting: Neurology

## 2023-04-19 ENCOUNTER — Ambulatory Visit: Payer: Self-pay | Admitting: *Deleted

## 2023-04-19 NOTE — Telephone Encounter (Signed)
Message from [place] B sent at 04/19/2023  9:50 AM EDT  Summary: muscle spasm   Patient states she does not take cyclobenzaprine (FLEXERIL) 5 MG tablet all the time but she started experiencing muscle spasms she thinks its because she has been doing yard work.  [employer] [address] [place], [place] - [address] AT [facility] [address] Phone: [phone] Fax: [phone]          Call History  Contact Date/Time Type Contact Phone/Fax User  04/19/2023 09:47 AM EDT Phone (Incoming) [place], [place] (Self) [phone] Rexene Edison) Elliot Gault   Reason for Disposition  Caller requesting a CONTROLLED substance prescription refill (e.g., narcotics, ADHD medicines)    Refill of Flexeril 5 mg  Answer Assessment - Initial Assessment Questions 1. DRUG NAME: "What medicine do you need to have refilled?"     Flexeril 5 mg 2. REFILLS REMAINING: "How many refills are remaining?" (Note: The label on the medicine or pill bottle will show how many refills are remaining. If there are no refills remaining, then a renewal may be needed.)     I need a refill of it.  I did some work in my yard and now I'm having muscle spasms.   I don't use this very often but I'm needing it after doing yard work. 3. EXPIRATION DATE: "What is the expiration date?" (Note: The label states when the prescription will expire, and thus can no longer be refilled.)     Not asked 4. PRESCRIBING HCP: "Who prescribed it?" Reason: If prescribed by specialist, call should be referred to that group.     Dr. Andrey Campanile 5. SYMPTOMS: "Do you have any symptoms?"     Muscle spasms after doing yard work. 6. PREGNANCY: "Is there any chance that you are pregnant?" "When was your last menstrual period?"     N/A due to age  Protocols used: Medication Refill and Renewal Call-A-AH

## 2023-04-19 NOTE — Telephone Encounter (Signed)
  Chief Complaint: Requesting refill of controlled substance  Flexeril 5 mg Symptoms: Muscle spasms after working in the yard over the weekend. Frequency: yard work over the weekend Pertinent Negatives: Patient denies N/A Disposition: [] ED /[] Urgent Care (no appt availability in office) / [] Appointment(In office/virtual)/ []  [facility]/ [] Home Care/ [] Refused Recommended Disposition /[] [facility]/ [x]  Follow-up with PCP Additional Notes: Refill of non delegated medication sent to Dr. Andrey Campanile for her review.

## 2023-04-20 ENCOUNTER — Telehealth: Payer: Self-pay | Admitting: *Deleted

## 2023-04-20 NOTE — Telephone Encounter (Signed)
error 

## 2023-04-26 ENCOUNTER — Other Ambulatory Visit: Payer: Self-pay | Admitting: Family Medicine

## 2023-04-26 DIAGNOSIS — G8929 Other chronic pain: Secondary | ICD-10-CM

## 2023-04-26 NOTE — Telephone Encounter (Signed)
Medication Refill - Medication: generic Flexaril  Has the patient contacted their pharmacy? Yes.   (Agent: If no, request that the patient contact the pharmacy for the refill. If patient does not wish to contact the pharmacy document the reason why and proceed with request.) (Agent: If yes, when and what did the pharmacy advise?)  Preferred Pharmacy (with phone number or street name): [facility] [place] Has the patient been seen for an appointment in the last year OR does the patient have an upcoming appointment? Yes.    Agent: Please be advised that RX refills may take up to 3 business days. We ask that you follow-up with your pharmacy.

## 2023-04-26 NOTE — Telephone Encounter (Signed)
Pt said this is second request for the Flexeril.  There was a nurse triage call from last week.

## 2023-04-27 NOTE — Telephone Encounter (Signed)
Requested medication (s) are due for refill today: yes  Requested medication (s) are on the active medication list: yes  Last refill:  09/14/22  Future visit scheduled: yes  Notes to clinic:  Unable to refill per protocol, cannot delegate.      Requested Prescriptions  Pending Prescriptions Disp Refills   cyclobenzaprine (FLEXERIL) 5 MG tablet 30 tablet 0     Not Delegated - Analgesics:  Muscle Relaxants Failed - 04/26/2023  9:26 AM      Failed - This refill cannot be delegated      Passed - Valid encounter within last 6 months    Recent Outpatient Visits           1 month ago Essential hypertension   [place] [facility], [place]   11 months ago Annual physical exam   [place] [facility], MD   1 year ago Preop examination   [place] [facility], MD   1 year ago Essential hypertension   [place] [facility], MD   1 year ago Essential hypertension   [place] [facility], MD       Future Appointments             In 1 month Georganna Skeans, MD [facility]

## 2023-04-28 MED ORDER — CYCLOBENZAPRINE HCL 5 MG PO TABS: 5.0000 mg | ORAL_TABLET | Freq: Three times a day (TID) | ORAL | 0 refills | Status: DC | PRN

## 2023-05-11 ENCOUNTER — Other Ambulatory Visit: Payer: Self-pay | Admitting: Family Medicine

## 2023-05-11 DIAGNOSIS — I10 Essential (primary) hypertension: Secondary | ICD-10-CM

## 2023-05-11 DIAGNOSIS — Z95828 Presence of other vascular implants and grafts: Secondary | ICD-10-CM

## 2023-05-12 NOTE — Telephone Encounter (Signed)
Requested medication (s) are due for refill today:   Yes for all 5  Requested medication (s) are on the active medication list:   Yes for all 5  Future visit scheduled:   Yes 12/5 with Dr. Andrey Campanile   Last ordered: Most 02/15/2023 for 90 days.   Potassium 10 Meq Discontinued 05/08/2022 but then there is a date for it on 02/15/2023 #90, 0 refills.    Returned because labs are due per protocol   Requested Prescriptions  Pending Prescriptions Disp Refills   potassium chloride (KLOR-CON M) 10 MEQ tablet [Pharmacy Med Name: POTASSIUM CHLORIDE ER 10 MEQ TAB] 90 tablet 1    Sig: TAKE ONE TABLET BY MOUTH ONE TIME DAILY     Endocrinology:  Minerals - Potassium Supplementation Passed - 05/11/2023  7:06 PM      Passed - K in normal range and within 360 days    Potassium  Date Value Ref Range Status  05/22/2022 4.3 3.5 - 5.2 mmol/L Final         Passed - Cr in normal range and within 360 days    Creatinine, Ser  Date Value Ref Range Status  05/22/2022 0.92 0.57 - 1.00 mg/dL Final         Passed - Valid encounter within last 12 months    Recent Outpatient Visits           2 months ago Essential hypertension   [place] [facility], MD   11 months ago Annual physical exam   [place] [facility] Lauris Po[facility], MD   1 year ago Preop examination   [place] [facility], MD   1 year ago Essential hypertension   [place] [facility], MD   1 year ago Essential hypertension   [place] [facility] Lauris Po[facility], MD       Future Appointments             In 4 weeks Georganna Skeans, MD [facility]             hydrochlorothiazide (HYDRODIURIL) 25 MG tablet [Pharmacy Med Name: HYDROCHLOROTHIAZIDE 25 MG TAB[*]] 90 tablet 1    Sig: TAKE ONE TABLET BY MOUTH ONE TIME DAILY     Cardiovascular: Diuretics - Thiazide  Failed - 05/11/2023  7:06 PM      Failed - Cr in normal range and within 180 days    Creatinine, Ser  Date Value Ref Range Status  05/22/2022 0.92 0.57 - 1.00 mg/dL Final         Failed - K in normal range and within 180 days    Potassium  Date Value Ref Range Status  05/22/2022 4.3 3.5 - 5.2 mmol/L Final         Failed - Na in normal range and within 180 days    Sodium  Date Value Ref Range Status  05/22/2022 140 134 - 144 mmol/L Final         Passed - Last BP in normal range    BP Readings from Last 1 Encounters:  03/05/23 123/73         Passed - Valid encounter within last 6 months    Recent Outpatient Visits           2 months ago Essential hypertension   [place] [facility]  Georganna Skeans, MD   11 months ago Annual physical exam   [place] [facility], MD   1 year ago Preop examination   [place] [facility], MD   1 year ago Essential hypertension   [place] [facility], MD   1 year ago Essential hypertension   [place] [facility], MD       Future Appointments             In 4 weeks Georganna Skeans, MD [facility] at [facility]             carvedilol (COREG) 25 MG tablet [Pharmacy Med Name: CARVEDILOL 25 MG TAB[*]] 180 tablet 0    Sig: TAKE ONE TABLET BY MOUTH TWICE A DAY WITH A MEAL     Cardiovascular: Beta Blockers 3 Passed - 05/11/2023  7:06 PM      Passed - Cr in normal range and within 360 days    Creatinine, Ser  Date Value Ref Range Status  05/22/2022 0.92 0.57 - 1.00 mg/dL Final         Passed - AST in normal range and within 360 days    AST  Date Value Ref Range Status  05/22/2022 16 0 - 40 IU/L Final         Passed - ALT in normal range and within 360 days    ALT  Date Value Ref Range Status  05/22/2022 20 0 - 32 IU/L Final         Passed - Last  BP in normal range    BP Readings from Last 1 Encounters:  03/05/23 123/73         Passed - Last Heart Rate in normal range    Pulse Readings from Last 1 Encounters:  03/05/23 (!) 55         Passed - Valid encounter within last 6 months    Recent Outpatient Visits           2 months ago Essential hypertension   [place] [facility], MD   11 months ago Annual physical exam   [place] [facility], MD   1 year ago Preop examination   [place] [facility], MD   1 year ago Essential hypertension   [place] [facility], MD   1 year ago Essential hypertension   [place] [facility], MD       Future Appointments             In 4 weeks Georganna Skeans, MD [facility]             simvastatin (ZOCOR) 20 MG tablet [Pharmacy Med Name: SIMVASTATIN 20 MG TAB[*]] 90 tablet 0    Sig: TAKE ONE TABLET BY MOUTH ONE TIME DAILY     Cardiovascular:  Antilipid - Statins Failed - 05/11/2023  7:06 PM      Failed - Lipid Panel in normal range within the last 12 months    Cholesterol, Total  Date Value Ref Range Status  05/22/2022 176 100 - 199 mg/dL Final   LDL Chol Calc (NIH)  Date Value Ref Range Status  05/22/2022 103 (H) 0 - 99  mg/dL Final   LDL Direct  Date Value Ref Range Status  05/09/2020 99 0 - 99 mg/dL Final   HDL  Date Value Ref Range Status  05/22/2022 46 >39 mg/dL Final   Triglycerides  Date Value Ref Range Status  05/22/2022 151 (H) 0 - 149 mg/dL Final         Passed - Patient is not pregnant      Passed - Valid encounter within last 12 months    Recent Outpatient Visits           2 months ago Essential hypertension   [place] [facility], MD   11 months ago Annual physical exam   [place] [facility], MD   1 year ago Preop examination   [place] [facility], MD   1 year ago Essential hypertension   [place] [facility], MD   1 year ago Essential hypertension   [place] [facility] Lauris Po[facility], MD       Future Appointments             In 4 weeks Georganna Skeans, MD [facility]             lisinopril (ZESTRIL) 40 MG tablet [Pharmacy Med Name: LISINOPRIL 40 MG TAB[*]] 90 tablet 0    Sig: TAKE ONE TABLET BY MOUTH ONE TIME DAILY     Cardiovascular:  ACE Inhibitors Failed - 05/11/2023  7:06 PM      Failed - Cr in normal range and within 180 days    Creatinine, Ser  Date Value Ref Range Status  05/22/2022 0.92 0.57 - 1.00 mg/dL Final         Failed - K in normal range and within 180 days    Potassium  Date Value Ref Range Status  05/22/2022 4.3 3.5 - 5.2 mmol/L Final         Passed - Patient is not pregnant      Passed - Last BP in normal range    BP Readings from Last 1 Encounters:  03/05/23 123/73         Passed - Valid encounter within last 6 months    Recent Outpatient Visits           2 months ago Essential hypertension   [place] [facility], MD   11 months ago Annual physical exam   [place] [facility], MD   1 year ago Preop examination   [place] [facility], MD   1 year ago Essential hypertension   [place] [facility], MD   1 year ago Essential hypertension   [place] [facility], MD       Future Appointments             In 4 weeks Georganna Skeans, MD [facility] at [facility]

## 2023-06-10 ENCOUNTER — Ambulatory Visit: Payer: Commercial Managed Care - HMO | Admitting: Family Medicine

## 2023-06-10 ENCOUNTER — Encounter: Payer: Self-pay | Admitting: Family Medicine

## 2023-06-10 VITALS — BP 144/77 | HR 54 | Temp 99.0°F | Resp 16 | Ht 64.88 in | Wt 208.6 lb

## 2023-06-10 DIAGNOSIS — I10 Essential (primary) hypertension: Secondary | ICD-10-CM

## 2023-06-10 DIAGNOSIS — F17200 Nicotine dependence, unspecified, uncomplicated: Secondary | ICD-10-CM

## 2023-06-10 DIAGNOSIS — Z6834 Body mass index (BMI) 34.0-34.9, adult: Secondary | ICD-10-CM

## 2023-06-10 DIAGNOSIS — E66811 Obesity, class 1: Secondary | ICD-10-CM | POA: Diagnosis not present

## 2023-06-10 DIAGNOSIS — E782 Mixed hyperlipidemia: Secondary | ICD-10-CM | POA: Diagnosis not present

## 2023-06-10 DIAGNOSIS — E6609 Other obesity due to excess calories: Secondary | ICD-10-CM

## 2023-06-10 NOTE — Progress Notes (Signed)
Established Patient Office Visit  Subjective    Patient ID: Ashley Guerrero, female    DOB: 11-16-1965  Age: 57 y.o. MRN: [id number]  CC:  Chief Complaint  Patient presents with   Follow-up    3 month    HPI [facility] presents for follow up of hypertension.   Outpatient Encounter Medications as of 06/10/2023  Medication Sig   acetaminophen (TYLENOL) 500 MG tablet Take 500 mg by mouth every 6 (six) hours as needed for mild pain.   amLODipine (NORVASC) 10 MG tablet Take 1 tablet (10 mg total) by mouth daily.   aspirin EC 81 MG tablet Take 1 tablet (81 mg total) by mouth daily.   carvedilol (COREG) 25 MG tablet TAKE ONE TABLET BY MOUTH TWICE A DAY WITH A MEAL   cyclobenzaprine (FLEXERIL) 5 MG tablet Take 1 tablet (5 mg total) by mouth 3 (three) times daily as needed for muscle spasms.   furosemide (LASIX) 20 MG tablet Take 1 tablet (20 mg total) by mouth as needed for edema.   hydrochlorothiazide (HYDRODIURIL) 25 MG tablet TAKE ONE TABLET BY MOUTH ONE TIME DAILY   levETIRAcetam (KEPPRA) 500 MG tablet TAKE ONE TABLET BY MOUTH TWICE A DAY   lisinopril (ZESTRIL) 40 MG tablet TAKE ONE TABLET BY MOUTH ONE TIME DAILY   potassium chloride (KLOR-CON M) 10 MEQ tablet TAKE ONE TABLET BY MOUTH ONE TIME DAILY   potassium chloride (KLOR-CON) 10 MEQ tablet Take 1 tablet (10 mEq total) by mouth daily.   simvastatin (ZOCOR) 20 MG tablet TAKE ONE TABLET BY MOUTH ONE TIME DAILY   traMADol (ULTRAM) 50 MG tablet Take 50 mg by mouth every 6 (six) hours as needed for moderate pain.   Vitamin D, Ergocalciferol, (DRISDOL) 1.25 MG (50000 UNIT) CAPS capsule Take 1 capsule (50,000 Units total) by mouth every 7 (seven) days.   No facility-administered encounter medications on file as of 06/10/2023.    Past Medical History:  Diagnosis Date   Aortic dissection (HCC)    Status post repair 5/16   Chronic diastolic CHF (congestive heart failure) (HCC)    Echo 6/16:  Severe LVH, EF 60-65%, no RWMA, Gr 1  DD, LA upper limits of normal, mild RAE, aneurysmal intra-atrial septum   Fibroid uterus    Hypertension     Past Surgical History:  Procedure Laterality Date   REPLACEMENT ASCENDING AORTA N/A 11/03/2014   Procedure: REPLACEMENT ASCENDING AORTA with a 30mm hemashield graft,  resuspension of aortic valve, hypothermic circulatory arrest and cardiopulmonary bypass.;  Surgeon: Delight Ovens, MD;  Location: [facility] OR;  Service: Open Heart Surgery;  Laterality: N/A;    Family History  Problem Relation Age of Onset   Hypertension Mother    Breast cancer Mother    Brain cancer Mother    Stroke Father    Heart failure Father    Hypertension Father    Diabetes Father    Hyperlipidemia Father    Hypertension Sister    Hypertension Brother    Cancer Sister    Breast cancer Maternal Aunt    Breast cancer Paternal Aunt    Breast cancer Maternal Grandmother    Breast cancer Paternal Grandmother    Heart attack Neg Hx     Social History   Socioeconomic History   Marital status: Single    Spouse name: Not on file   Number of children: 0   Years of education: Not on file   Highest education level: Associate degree: occupational,  technical, or vocational program  Occupational History   Not on file  Tobacco Use   Smoking status: Some Days    Current packs/day: 1.00    Average packs/day: 1 pack/day for 8.6 years (8.6 ttl pk-yrs)    Types: Cigarettes    Start date: 11/03/2014   Smokeless tobacco: Never   Tobacco comments:    5 cig a week  Vaping Use   Vaping status: Never Used  Substance and Sexual Activity   Alcohol use: Not Currently   Drug use: Not Currently   Sexual activity: Not Currently  Other Topics Concern   Not on file  Social History Narrative   Right handed    Live with self in a one level home   Caffeine rare   Social Determinants of Health   Financial Resource Strain: Medium Risk (06/08/2023)   Overall Financial Resource Strain (CARDIA)    Difficulty of Paying  Living Expenses: Somewhat hard  Food Insecurity: No Food Insecurity (06/08/2023)   Hunger Vital Sign    Worried About Running Out of Food in the Last Year: Never true    Ran Out of Food in the Last Year: Never true  Transportation Needs: No Transportation Needs (06/08/2023)   PRAPARE - Administrator, Civil Service (Medical): No    Lack of Transportation (Non-Medical): No  Physical Activity: Insufficiently Active (06/08/2023)   Exercise Vital Sign    Days of Exercise per Week: 5 days    Minutes of Exercise per Session: 20 min  Stress: No Stress Concern Present (06/08/2023)   [employer] of Occupational Health - Occupational Stress Questionnaire    Feeling of Stress : Not at all  Social Connections: Unknown (06/08/2023)   Social Connection and Isolation Panel [NHANES]    Frequency of Communication with Friends and Family: More than three times a week    Frequency of Social Gatherings with Friends and Family: Once a week    Attends Religious Services: Not on Marketing executive or Organizations: Patient declined    Attends Banker Meetings: Not on file    Marital Status: Patient declined  Intimate Partner Violence: Not At Risk (06/10/2023)   Humiliation, Afraid, Rape, and Kick questionnaire    Fear of Current or Ex-Partner: No    Emotionally Abused: No    Physically Abused: No    Sexually Abused: No    Review of Systems  All other systems reviewed and are negative.       Objective    BP (!) 144/77 (BP Location: Right Arm, Patient Position: Sitting, Cuff Size: Normal)   Pulse (!) 54   Temp 99 F (37.2 C) (Oral)   Resp 16   Ht 5' 4.88" (1.648 m)   Wt 208 lb 9.6 oz (94.6 kg)   LMP 10/29/2014   SpO2 95%   BMI 34.84 kg/m   Physical Exam Vitals and nursing note reviewed.  Constitutional:      General: She is not in acute distress.    Appearance: She is obese.  Cardiovascular:     Rate and Rhythm: Normal rate and regular rhythm.   Pulmonary:     Effort: Pulmonary effort is normal.     Breath sounds: Normal breath sounds.  Abdominal:     Palpations: Abdomen is soft.     Tenderness: There is no abdominal tenderness.  Neurological:     General: No focal deficit present.     Mental Status: She  is alert and oriented to person, place, and time.         Assessment & Plan:   Essential hypertension  Class 1 obesity due to excess calories with serious comorbidity and body mass index (BMI) of 34.0 to 34.9 in adult  Mixed hyperlipidemia  Tobacco use disorder     Return in about 4 weeks (around 07/08/2023) for follow up, physical.   Tommie Raymond, MD

## 2023-06-13 ENCOUNTER — Other Ambulatory Visit: Payer: Self-pay | Admitting: Family Medicine

## 2023-06-13 DIAGNOSIS — I10 Essential (primary) hypertension: Secondary | ICD-10-CM

## 2023-06-14 ENCOUNTER — Encounter: Payer: Self-pay | Admitting: Family Medicine

## 2023-06-22 ENCOUNTER — Other Ambulatory Visit: Payer: Self-pay | Admitting: Neurology

## 2023-07-01 ENCOUNTER — Other Ambulatory Visit: Payer: Self-pay | Admitting: Family Medicine

## 2023-07-01 DIAGNOSIS — I10 Essential (primary) hypertension: Secondary | ICD-10-CM

## 2023-07-01 DIAGNOSIS — Z95828 Presence of other vascular implants and grafts: Secondary | ICD-10-CM

## 2023-07-03 ENCOUNTER — Other Ambulatory Visit: Payer: Self-pay | Admitting: Family Medicine

## 2023-07-03 DIAGNOSIS — G8929 Other chronic pain: Secondary | ICD-10-CM

## 2023-07-03 DIAGNOSIS — I10 Essential (primary) hypertension: Secondary | ICD-10-CM

## 2023-07-16 ENCOUNTER — Encounter: Payer: Commercial Managed Care - HMO | Admitting: Family Medicine

## 2023-08-02 ENCOUNTER — Other Ambulatory Visit: Payer: Self-pay | Admitting: Neurology

## 2023-08-02 ENCOUNTER — Other Ambulatory Visit: Payer: Self-pay | Admitting: Family Medicine

## 2023-08-02 DIAGNOSIS — G8929 Other chronic pain: Secondary | ICD-10-CM

## 2023-08-02 NOTE — Telephone Encounter (Signed)
Copied from CRM [id number]. Topic: Clinical - Medication Refill >> Aug 02, 2023  9:59 AM Jorje Guild R wrote: Most Recent Primary Care Visit:  Provider: Georganna Skeans  [facility]  Visit Type: OFFICE VISIT  Date: 06/10/2023  Medication: levETIRAcetam (KEPPRA) 500 MG tablet  Has the patient contacted their pharmacy? Yes (Agent: If no, request that the patient contact the pharmacy for the refill. If patient does not wish to contact the pharmacy document the reason why and proceed with request.) (Agent: If yes, when and what did the pharmacy advise?)  Is this the correct pharmacy for this prescription? Yes If no, delete pharmacy and type the correct one.  This is the patient's preferred pharmacy:  [facility] [address] [place], [place] - [address] [facility] [address] [place]. [place] [address] Phone: [phone] Fax: [phone]   Has the prescription been filled recently? Yes  Is the patient out of the medication? Yes  Has the patient been seen for an appointment in the last year OR does the patient have an upcoming appointment? Yes  Can we respond through MyChart? Yes  Agent: Please be advised that Rx refills may take up to 3 business days. We ask that you follow-up with your pharmacy.

## 2023-08-04 ENCOUNTER — Encounter: Payer: Self-pay | Admitting: Family Medicine

## 2023-08-04 ENCOUNTER — Ambulatory Visit (INDEPENDENT_AMBULATORY_CARE_PROVIDER_SITE_OTHER): Payer: Commercial Managed Care - HMO | Admitting: Family Medicine

## 2023-08-04 VITALS — BP 128/85 | HR 54 | Temp 98.0°F | Resp 16 | Ht 66.0 in | Wt 207.2 lb

## 2023-08-04 DIAGNOSIS — E559 Vitamin D deficiency, unspecified: Secondary | ICD-10-CM | POA: Diagnosis not present

## 2023-08-04 DIAGNOSIS — Z1231 Encounter for screening mammogram for malignant neoplasm of breast: Secondary | ICD-10-CM | POA: Diagnosis not present

## 2023-08-04 DIAGNOSIS — E785 Hyperlipidemia, unspecified: Secondary | ICD-10-CM

## 2023-08-04 DIAGNOSIS — Z130 Encounter for screening for diseases of the blood and blood-forming organs and certain disorders involving the immune mechanism: Secondary | ICD-10-CM

## 2023-08-04 DIAGNOSIS — Z95828 Presence of other vascular implants and grafts: Secondary | ICD-10-CM

## 2023-08-04 DIAGNOSIS — Z0000 Encounter for general adult medical examination without abnormal findings: Secondary | ICD-10-CM

## 2023-08-04 DIAGNOSIS — Z1329 Encounter for screening for other suspected endocrine disorder: Secondary | ICD-10-CM

## 2023-08-04 DIAGNOSIS — I10 Essential (primary) hypertension: Secondary | ICD-10-CM

## 2023-08-04 DIAGNOSIS — Z13228 Encounter for screening for other metabolic disorders: Secondary | ICD-10-CM

## 2023-08-04 MED ORDER — AMLODIPINE BESYLATE 10 MG PO TABS: 10.0000 mg | ORAL_TABLET | Freq: Every day | ORAL | 1 refills | Status: DC

## 2023-08-04 MED ORDER — LISINOPRIL 40 MG PO TABS: 40.0000 mg | ORAL_TABLET | Freq: Every day | ORAL | 1 refills | Status: DC

## 2023-08-04 MED ORDER — HYDROCHLOROTHIAZIDE 25 MG PO TABS: 25.0000 mg | ORAL_TABLET | Freq: Every day | ORAL | 1 refills | Status: DC

## 2023-08-04 MED ORDER — CARVEDILOL 25 MG PO TABS: ORAL_TABLET | ORAL | 1 refills | Status: DC

## 2023-08-04 MED ORDER — POTASSIUM CHLORIDE CRYS ER 10 MEQ PO TBCR: 10.0000 meq | EXTENDED_RELEASE_TABLET | Freq: Every day | ORAL | 1 refills | Status: DC

## 2023-08-05 ENCOUNTER — Encounter: Payer: Self-pay | Admitting: Family Medicine

## 2023-08-05 ENCOUNTER — Other Ambulatory Visit: Payer: Self-pay | Admitting: Neurology

## 2023-08-05 LAB — HEMOGLOBIN A1C
Est. average glucose Bld gHb Est-mCnc: 117 mg/dL
Hgb A1c MFr Bld: 5.7 % — ABNORMAL HIGH (ref 4.8–5.6)

## 2023-08-05 LAB — CBC WITH DIFFERENTIAL/PLATELET
Basophils Absolute: 0 10*3/uL (ref 0.0–0.2)
Basos: 0 %
EOS (ABSOLUTE): 0.2 10*3/uL (ref 0.0–0.4)
Eos: 5 %
Hematocrit: 41 % (ref 34.0–46.6)
Hemoglobin: 13.5 g/dL (ref 11.1–15.9)
Immature Grans (Abs): 0 10*3/uL (ref 0.0–0.1)
Immature Granulocytes: 0 %
Lymphocytes Absolute: 2.5 10*3/uL (ref 0.7–3.1)
Lymphs: 47 %
MCH: 31.5 pg (ref 26.6–33.0)
MCHC: 32.9 g/dL (ref 31.5–35.7)
MCV: 96 fL (ref 79–97)
Monocytes Absolute: 0.6 10*3/uL (ref 0.1–0.9)
Monocytes: 11 %
Neutrophils Absolute: 2 10*3/uL (ref 1.4–7.0)
Neutrophils: 37 %
Platelets: 286 10*3/uL (ref 150–450)
RBC: 4.29 x10E6/uL (ref 3.77–5.28)
RDW: 12.7 % (ref 11.7–15.4)
WBC: 5.3 10*3/uL (ref 3.4–10.8)

## 2023-08-05 LAB — CMP14+EGFR
ALT: 22 [IU]/L (ref 0–32)
AST: 18 [IU]/L (ref 0–40)
Albumin: 4.3 g/dL (ref 3.8–4.9)
Alkaline Phosphatase: 91 [IU]/L (ref 44–121)
BUN/Creatinine Ratio: 26 — ABNORMAL HIGH (ref 9–23)
BUN: 23 mg/dL (ref 6–24)
Bilirubin Total: 0.2 mg/dL (ref 0.0–1.2)
CO2: 23 mmol/L (ref 20–29)
Calcium: 9.6 mg/dL (ref 8.7–10.2)
Chloride: 102 mmol/L (ref 96–106)
Creatinine, Ser: 0.87 mg/dL (ref 0.57–1.00)
Globulin, Total: 2.5 g/dL (ref 1.5–4.5)
Glucose: 111 mg/dL — ABNORMAL HIGH (ref 70–99)
Potassium: 4.3 mmol/L (ref 3.5–5.2)
Sodium: 141 mmol/L (ref 134–144)
Total Protein: 6.8 g/dL (ref 6.0–8.5)
eGFR: 78 mL/min/{1.73_m2} (ref >59)

## 2023-08-05 LAB — LIPID PANEL
Chol/HDL Ratio: 4 {ratio} (ref 0.0–4.4)
Cholesterol, Total: 172 mg/dL (ref 100–199)
HDL: 43 mg/dL (ref >39)
LDL Chol Calc (NIH): 96 mg/dL (ref 0–99)
Triglycerides: 190 mg/dL — ABNORMAL HIGH (ref 0–149)
VLDL Cholesterol Cal: 33 mg/dL (ref 5–40)

## 2023-08-05 LAB — VITAMIN D 25 HYDROXY (VIT D DEFICIENCY, FRACTURES): Vit D, 25-Hydroxy: 48.2 ng/mL (ref 30.0–100.0)

## 2023-08-05 NOTE — Progress Notes (Signed)
Established Patient Office Visit  Subjective    Patient ID: Ashley Guerrero, female    DOB: 1965-11-21  Age: 58 y.o. MRN: [id number]  CC:  Chief Complaint  Patient presents with   impacted tooth    Medication refill, fever,     HPI [employer] [facility] presents for routine annual exam with med refills. Patient reports that she believes she has a tooth infection.   Outpatient Encounter Medications as of 08/04/2023  Medication Sig   acetaminophen (TYLENOL) 500 MG tablet Take 500 mg by mouth every 6 (six) hours as needed for mild pain.   aspirin EC 81 MG tablet Take 1 tablet (81 mg total) by mouth daily.   cyclobenzaprine (FLEXERIL) 5 MG tablet Take 1 tablet (5 mg total) by mouth 3 (three) times daily as needed for muscle spasms.   furosemide (LASIX) 20 MG tablet Take 1 tablet (20 mg total) by mouth as needed for edema.   levETIRAcetam (KEPPRA) 500 MG tablet TAKE ONE TABLET BY MOUTH TWICE A DAY   potassium chloride (KLOR-CON) 10 MEQ tablet Take 1 tablet (10 mEq total) by mouth daily.   simvastatin (ZOCOR) 20 MG tablet TAKE ONE TABLET BY MOUTH ONE TIME DAILY   traMADol (ULTRAM) 50 MG tablet Take 50 mg by mouth every 6 (six) hours as needed for moderate pain.   Vitamin D, Ergocalciferol, (DRISDOL) 1.25 MG (50000 UNIT) CAPS capsule Take 1 capsule (50,000 Units total) by mouth every 7 (seven) days.   [DISCONTINUED] amLODipine (NORVASC) 10 MG tablet TAKE ONE TABLET BY MOUTH ONE TIME DAILY   [DISCONTINUED] carvedilol (COREG) 25 MG tablet TAKE ONE TABLET BY MOUTH TWICE A DAY WITH A MEAL   [DISCONTINUED] hydrochlorothiazide (HYDRODIURIL) 25 MG tablet TAKE ONE TABLET BY MOUTH ONE TIME DAILY   [DISCONTINUED] lisinopril (ZESTRIL) 40 MG tablet TAKE ONE TABLET BY MOUTH ONE TIME DAILY   [DISCONTINUED] potassium chloride (KLOR-CON M) 10 MEQ tablet TAKE ONE TABLET BY MOUTH ONE TIME DAILY   amLODipine (NORVASC) 10 MG tablet Take 1 tablet (10 mg total) by mouth daily.   carvedilol (COREG) 25 MG tablet TAKE  ONE TABLET BY MOUTH TWICE A DAY WITH A MEAL   hydrochlorothiazide (HYDRODIURIL) 25 MG tablet Take 1 tablet (25 mg total) by mouth daily.   lisinopril (ZESTRIL) 40 MG tablet Take 1 tablet (40 mg total) by mouth daily.   potassium chloride (KLOR-CON M) 10 MEQ tablet Take 1 tablet (10 mEq total) by mouth daily.   No facility-administered encounter medications on file as of 08/04/2023.    Past Medical History:  Diagnosis Date   Aortic dissection (HCC)    Status post repair 5/16   Chronic diastolic CHF (congestive heart failure) (HCC)    Echo 6/16:  Severe LVH, EF 60-65%, no RWMA, Gr 1 DD, LA upper limits of normal, mild RAE, aneurysmal intra-atrial septum   Fibroid uterus    Hypertension     Past Surgical History:  Procedure Laterality Date   REPLACEMENT ASCENDING AORTA N/A 11/03/2014   Procedure: REPLACEMENT ASCENDING AORTA with a 30mm hemashield graft,  resuspension of aortic valve, hypothermic circulatory arrest and cardiopulmonary bypass.;  Surgeon: Delight Ovens, MD;  Location: [facility] OR;  Service: Open Heart Surgery;  Laterality: N/A;    Family History  Problem Relation Age of Onset   Hypertension Mother    Breast cancer Mother    Brain cancer Mother    Stroke Father    Heart failure Father    Hypertension Father  Diabetes Father    Hyperlipidemia Father    Hypertension Sister    Hypertension Brother    Cancer Sister    Breast cancer Maternal Aunt    Breast cancer Paternal Aunt    Breast cancer Maternal Grandmother    Breast cancer Paternal Grandmother    Heart attack Neg Hx     Social History   Socioeconomic History   Marital status: Single    Spouse name: Not on file   Number of children: 0   Years of education: Not on file   Highest education level: Associate degree: occupational, Scientist, product/process development, or vocational program  Occupational History   Not on file  Tobacco Use   Smoking status: Some Days    Current packs/day: 1.00    Average packs/day: 1 pack/day for 8.8  years (8.8 ttl pk-yrs)    Types: Cigarettes    Start date: 11/03/2014   Smokeless tobacco: Never   Tobacco comments:    5 cig a week  Vaping Use   Vaping status: Never Used  Substance and Sexual Activity   Alcohol use: Not Currently   Drug use: Not Currently   Sexual activity: Not Currently  Other Topics Concern   Not on file  Social History Narrative   Right handed    Live with self in a one level home   Caffeine rare   Social Drivers of Health   Financial Resource Strain: Medium Risk (06/08/2023)   Overall Financial Resource Strain (CARDIA)    Difficulty of Paying Living Expenses: Somewhat hard  Food Insecurity: No Food Insecurity (06/08/2023)   Hunger Vital Sign    Worried About Running Out of Food in the Last Year: Never true    Ran Out of Food in the Last Year: Never true  Transportation Needs: No Transportation Needs (06/08/2023)   PRAPARE - Administrator, Civil Service (Medical): No    Lack of Transportation (Non-Medical): No  Physical Activity: Insufficiently Active (06/08/2023)   Exercise Vital Sign    Days of Exercise per Week: 5 days    Minutes of Exercise per Session: 20 min  Stress: No Stress Concern Present (06/08/2023)   [employer] of Occupational Health - Occupational Stress Questionnaire    Feeling of Stress : Not at all  Social Connections: Unknown (06/08/2023)   Social Connection and Isolation Panel [NHANES]    Frequency of Communication with Friends and Family: More than three times a week    Frequency of Social Gatherings with Friends and Family: Once a week    Attends Religious Services: Not on Marketing executive or Organizations: Patient declined    Attends Banker Meetings: Not on file    Marital Status: Patient declined  Intimate Partner Violence: Not At Risk (06/10/2023)   Humiliation, Afraid, Rape, and Kick questionnaire    Fear of Current or Ex-Partner: No    Emotionally Abused: No    Physically Abused:  No    Sexually Abused: No    Review of Systems  All other systems reviewed and are negative.       Objective    BP 128/85 (BP Location: Right Arm, Patient Position: Sitting, Cuff Size: Large)   Pulse (!) 54   Temp 98 F (36.7 C) (Oral)   Resp 16   Ht 5\' 6"  (1.676 m)   Wt 207 lb 3.2 oz (94 kg)   LMP 10/29/2014   SpO2 94%   BMI 33.44 kg/m  Physical Exam Vitals and nursing note reviewed.  Constitutional:      General: She is not in acute distress. HENT:     Head: Normocephalic and atraumatic.     Right Ear: Tympanic membrane, ear canal and external ear normal.     Left Ear: Tympanic membrane, ear canal and external ear normal.     Nose: Nose normal.     Mouth/Throat:     Mouth: Mucous membranes are moist.     Dentition: Dental tenderness and dental caries present.     Pharynx: Oropharynx is clear.  Eyes:     Conjunctiva/sclera: Conjunctivae normal.     Pupils: Pupils are equal, round, and reactive to light.  Neck:     Thyroid: No thyromegaly.  Cardiovascular:     Rate and Rhythm: Normal rate and regular rhythm.     Heart sounds: Normal heart sounds. No murmur heard. Pulmonary:     Effort: Pulmonary effort is normal. No respiratory distress.     Breath sounds: Normal breath sounds.  Abdominal:     General: There is no distension.     Palpations: Abdomen is soft. There is no mass.     Tenderness: There is no abdominal tenderness.  Musculoskeletal:        General: Normal range of motion.     Cervical back: Normal range of motion and neck supple.  Skin:    General: Skin is warm and dry.  Neurological:     General: No focal deficit present.     Mental Status: She is alert and oriented to person, place, and time.  Psychiatric:        Mood and Affect: Mood normal.        Behavior: Behavior normal.         Assessment & Plan:   1. Annual physical exam (Primary)  - CMP14+EGFR  2. Encounter for screening mammogram for malignant neoplasm of breast  - MM  3D SCREENING MAMMOGRAM BILATERAL BREAST; Future  3. Vitamin D deficiency  - Vitamin D, 25-hydroxy  4. Hyperlipidemia, unspecified hyperlipidemia type  - Lipid Panel  5. Screening for deficiency anemia  - CBC with Differential  6. Screening for endocrine/metabolic/immunity disorders  - Hemoglobin A1c     Return in about 3 months (around 11/02/2023) for follow up, chronic med issues.   Tommie Raymond, MD

## 2023-08-06 ENCOUNTER — Telehealth: Payer: Self-pay | Admitting: Neurology

## 2023-08-06 MED ORDER — LEVETIRACETAM 500 MG PO TABS: 500.0000 mg | ORAL_TABLET | Freq: Two times a day (BID) | ORAL | 5 refills | Status: DC

## 2023-08-06 NOTE — Telephone Encounter (Signed)
 Refill sent in for pt.

## 2023-08-06 NOTE — Telephone Encounter (Signed)
1. Which medications need refilled? (List name and dosage, if known) keppra - pt scheduled for 02/29/24  2. Which pharmacy/location is medication to be sent to? (include street and city if local pharmacy) [facility] [address]

## 2023-09-01 ENCOUNTER — Other Ambulatory Visit: Payer: Self-pay | Admitting: Family Medicine

## 2023-09-01 DIAGNOSIS — G8929 Other chronic pain: Secondary | ICD-10-CM

## 2023-09-10 ENCOUNTER — Encounter: Payer: Commercial Managed Care - HMO | Admitting: Family Medicine

## 2023-09-25 ENCOUNTER — Other Ambulatory Visit: Payer: Self-pay | Admitting: Family Medicine

## 2023-09-25 DIAGNOSIS — Z95828 Presence of other vascular implants and grafts: Secondary | ICD-10-CM

## 2023-10-13 ENCOUNTER — Ambulatory Visit
Admission: RE | Admit: 2023-10-13 | Discharge: 2023-10-13 | Disposition: A | Discharge status: Home / Self Care | Source: Ambulatory Visit | Attending: Family Medicine | Admitting: Family Medicine

## 2023-10-13 DIAGNOSIS — Z1231 Encounter for screening mammogram for malignant neoplasm of breast: Secondary | ICD-10-CM

## 2023-10-31 ENCOUNTER — Other Ambulatory Visit: Payer: Self-pay | Admitting: Family Medicine

## 2023-10-31 DIAGNOSIS — I10 Essential (primary) hypertension: Secondary | ICD-10-CM

## 2023-11-02 ENCOUNTER — Encounter: Payer: Self-pay | Admitting: Family Medicine

## 2023-11-02 ENCOUNTER — Ambulatory Visit (INDEPENDENT_AMBULATORY_CARE_PROVIDER_SITE_OTHER): Payer: Commercial Managed Care - HMO | Admitting: Family Medicine

## 2023-11-02 VITALS — BP 136/83 | HR 54 | Wt 206.2 lb

## 2023-11-02 DIAGNOSIS — I10 Essential (primary) hypertension: Secondary | ICD-10-CM | POA: Diagnosis not present

## 2023-11-02 DIAGNOSIS — Z95828 Presence of other vascular implants and grafts: Secondary | ICD-10-CM | POA: Diagnosis not present

## 2023-11-02 DIAGNOSIS — E785 Hyperlipidemia, unspecified: Secondary | ICD-10-CM | POA: Diagnosis not present

## 2023-11-02 MED ORDER — POTASSIUM CHLORIDE CRYS ER 10 MEQ PO TBCR: 10.0000 meq | EXTENDED_RELEASE_TABLET | Freq: Every day | ORAL | 1 refills | Status: DC

## 2023-11-02 MED ORDER — HYDROCHLOROTHIAZIDE 25 MG PO TABS: 25.0000 mg | ORAL_TABLET | Freq: Every day | ORAL | 1 refills | Status: AC

## 2023-11-02 MED ORDER — SIMVASTATIN 20 MG PO TABS: 20.0000 mg | ORAL_TABLET | Freq: Every day | ORAL | 1 refills | Status: DC

## 2023-11-02 MED ORDER — CARVEDILOL 25 MG PO TABS: ORAL_TABLET | ORAL | 1 refills | Status: DC

## 2023-11-02 MED ORDER — AMLODIPINE BESYLATE 10 MG PO TABS: 10.0000 mg | ORAL_TABLET | Freq: Every day | ORAL | 1 refills | Status: DC

## 2023-11-02 MED ORDER — LISINOPRIL 40 MG PO TABS: 40.0000 mg | ORAL_TABLET | Freq: Every day | ORAL | 1 refills | Status: DC

## 2023-11-02 NOTE — Progress Notes (Unsigned)
 Established Patient Office Visit  Subjective    Patient ID: Ashley Guerrero, female    DOB: 10/01/1965  Age: 58 y.o. MRN: [id number]  CC:  Chief Complaint  Patient presents with   Medical Management of Chronic Issues   Medication Management    90 days     HPI [facility] presents for routine follow up of chronic med issues including hypertension. Patient reports med compliance and denies acute complaints.  Outpatient Encounter Medications as of 11/02/2023  Medication Sig   acetaminophen  (TYLENOL ) 500 MG tablet Take 500 mg by mouth every 6 (six) hours as needed for mild pain.   aspirin  EC 81 MG tablet Take 1 tablet (81 mg total) by mouth daily.   cyclobenzaprine  (FLEXERIL ) 5 MG tablet TAKE ONE TABLET BY MOUTH THREE TIMES A DAY AS NEEDED FOR MUSCLE SPASMS   furosemide  (LASIX ) 20 MG tablet Take 1 tablet (20 mg total) by mouth as needed for edema.   levETIRAcetam  (KEPPRA ) 500 MG tablet Take 1 tablet (500 mg total) by mouth 2 (two) times daily.   potassium chloride  (KLOR-CON ) 10 MEQ tablet Take 1 tablet (10 mEq total) by mouth daily.   traMADol  (ULTRAM ) 50 MG tablet Take 50 mg by mouth every 6 (six) hours as needed for moderate pain.   Vitamin D , Ergocalciferol , (DRISDOL ) 1.25 MG (50000 UNIT) CAPS capsule Take 1 capsule (50,000 Units total) by mouth every 7 (seven) days.   [DISCONTINUED] amLODipine  (NORVASC ) 10 MG tablet Take 1 tablet (10 mg total) by mouth daily.   [DISCONTINUED] carvedilol  (COREG ) 25 MG tablet TAKE ONE TABLET BY MOUTH TWICE A DAY WITH A MEAL   [DISCONTINUED] hydrochlorothiazide  (HYDRODIURIL ) 25 MG tablet Take 1 tablet (25 mg total) by mouth daily.   [DISCONTINUED] lisinopril  (ZESTRIL ) 40 MG tablet Take 1 tablet (40 mg total) by mouth daily.   [DISCONTINUED] potassium chloride  (KLOR-CON  M) 10 MEQ tablet Take 1 tablet (10 mEq total) by mouth daily.   [DISCONTINUED] simvastatin  (ZOCOR ) 20 MG tablet TAKE ONE TABLET BY MOUTH ONE TIME DAILY   amLODipine  (NORVASC ) 10 MG  tablet Take 1 tablet (10 mg total) by mouth daily.   carvedilol  (COREG ) 25 MG tablet TAKE ONE TABLET BY MOUTH TWICE A DAY WITH A MEAL   hydrochlorothiazide  (HYDRODIURIL ) 25 MG tablet Take 1 tablet (25 mg total) by mouth daily.   lisinopril  (ZESTRIL ) 40 MG tablet Take 1 tablet (40 mg total) by mouth daily.   potassium chloride  (KLOR-CON  M) 10 MEQ tablet Take 1 tablet (10 mEq total) by mouth daily.   simvastatin  (ZOCOR ) 20 MG tablet Take 1 tablet (20 mg total) by mouth daily.   No facility-administered encounter medications on file as of 11/02/2023.    Past Medical History:  Diagnosis Date   Aortic dissection (HCC)    Status post repair 5/16   Chronic diastolic CHF (congestive heart failure) (HCC)    Echo 6/16:  Severe LVH, EF 60-65%, no RWMA, Gr 1 DD, LA upper limits of normal, mild RAE, aneurysmal intra-atrial septum   Fibroid uterus    Hypertension     Past Surgical History:  Procedure Laterality Date   REPLACEMENT ASCENDING AORTA N/A 11/03/2014   Procedure: REPLACEMENT ASCENDING AORTA with a 30mm hemashield graft,  resuspension of aortic valve, hypothermic circulatory arrest and cardiopulmonary bypass.;  Surgeon: Norita Beauvais, MD;  Location: [facility] OR;  Service: Open Heart Surgery;  Laterality: N/A;    Family History  Problem Relation Age of Onset   Hypertension Mother  Breast cancer Mother    Brain cancer Mother    Stroke Father    Heart failure Father    Hypertension Father    Diabetes Father    Hyperlipidemia Father    Hypertension Sister    Hypertension Brother    Cancer Sister    Breast cancer Maternal Aunt    Breast cancer Paternal Aunt    Breast cancer Maternal Grandmother    Breast cancer Paternal Grandmother    Heart attack Neg Hx     Social History   Socioeconomic History   Marital status: Single    Spouse name: Not on file   Number of children: 0   Years of education: Not on file   Highest education level: Associate degree: occupational, Scientist, product/process development,  or vocational program  Occupational History   Not on file  Tobacco Use   Smoking status: Some Days    Current packs/day: 1.00    Average packs/day: 1 pack/day for 9.0 years (9.0 ttl pk-yrs)    Types: Cigarettes    Start date: 11/03/2014   Smokeless tobacco: Never   Tobacco comments:    5 cig a week  Vaping Use   Vaping status: Never Used  Substance and Sexual Activity   Alcohol use: Not Currently   Drug use: Not Currently   Sexual activity: Not Currently  Other Topics Concern   Not on file  Social History Narrative   Right handed    Live with self in a one level home   Caffeine rare   Social Drivers of Health   Financial Resource Strain: Medium Risk (10/29/2023)   Overall Financial Resource Strain (CARDIA)    Difficulty of Paying Living Expenses: Somewhat hard  Food Insecurity: Food Insecurity Present (10/29/2023)   Hunger Vital Sign    Worried About Running Out of Food in the Last Year: Sometimes true    Ran Out of Food in the Last Year: Sometimes true  Transportation Needs: No Transportation Needs (10/29/2023)   PRAPARE - Administrator, Civil Service (Medical): No    Lack of Transportation (Non-Medical): No  Physical Activity: Insufficiently Active (10/29/2023)   Exercise Vital Sign    Days of Exercise per Week: 2 days    Minutes of Exercise per Session: 20 min  Stress: Stress Concern Present (10/29/2023)   [employer] of Occupational Health - Occupational Stress Questionnaire    Feeling of Stress : To some extent  Social Connections: Moderately Isolated (10/29/2023)   Social Connection and Isolation Panel [NHANES]    Frequency of Communication with Friends and Family: Three times a week    Frequency of Social Gatherings with Friends and Family: Once a week    Attends Religious Services: 1 to 4 times per year    Active Member of [employer] or Organizations: No    Attends Engineer, structural: Not on file    Marital Status: Divorced  Intimate  Partner Violence: Not At Risk (06/10/2023)   Humiliation, Afraid, Rape, and Kick questionnaire    Fear of Current or Ex-Partner: No    Emotionally Abused: No    Physically Abused: No    Sexually Abused: No    Review of Systems  All other systems reviewed and are negative.       Objective    BP 136/83 (BP Location: Right Arm, Patient Position: Sitting, Cuff Size: Normal)   Pulse (!) 54   Wt 206 lb 3.2 oz (93.5 kg)   LMP 10/29/2014  SpO2 97%   BMI 33.28 kg/m   Physical Exam Vitals and nursing note reviewed.  Constitutional:      General: She is not in acute distress.    Appearance: She is obese.  Cardiovascular:     Rate and Rhythm: Normal rate and regular rhythm.  Pulmonary:     Effort: Pulmonary effort is normal.     Breath sounds: Normal breath sounds.  Abdominal:     Palpations: Abdomen is soft.     Tenderness: There is no abdominal tenderness.  Neurological:     General: No focal deficit present.     Mental Status: She is alert and oriented to person, place, and time.         Assessment & Plan:   1. Essential (primary) hypertension (Primary)  - amLODipine  (NORVASC ) 10 MG tablet; Take 1 tablet (10 mg total) by mouth daily.  Dispense: 90 tablet; Refill: 1 - lisinopril  (ZESTRIL ) 40 MG tablet; Take 1 tablet (40 mg total) by mouth daily.  Dispense: 90 tablet; Refill: 1  2. Hyperlipidemia, unspecified hyperlipidemia type   3. S/P ascending aortic replacement  - simvastatin  (ZOCOR ) 20 MG tablet; Take 1 tablet (20 mg total) by mouth daily.  Dispense: 90 tablet; Refill: 1     Return in about 6 months (around 05/03/2024) for follow up, chronic med issues.   Arlo Lama, MD

## 2023-11-03 ENCOUNTER — Encounter: Payer: Self-pay | Admitting: Family Medicine

## 2024-01-29 ENCOUNTER — Other Ambulatory Visit: Payer: Self-pay | Admitting: Neurology

## 2024-02-15 ENCOUNTER — Other Ambulatory Visit: Payer: Self-pay | Admitting: Neurology

## 2024-02-29 ENCOUNTER — Ambulatory Visit (INDEPENDENT_AMBULATORY_CARE_PROVIDER_SITE_OTHER): Payer: Commercial Managed Care - HMO | Admitting: Neurology

## 2024-02-29 ENCOUNTER — Encounter: Payer: Self-pay | Admitting: Neurology

## 2024-02-29 ENCOUNTER — Other Ambulatory Visit: Payer: Self-pay | Admitting: Neurology

## 2024-02-29 VITALS — BP 133/88 | HR 68 | Resp 20 | Ht 66.0 in | Wt 206.0 lb

## 2024-02-29 DIAGNOSIS — G40009 Localization-related (focal) (partial) idiopathic epilepsy and epileptic syndromes with seizures of localized onset, not intractable, without status epilepticus: Secondary | ICD-10-CM

## 2024-02-29 MED ORDER — LEVETIRACETAM 500 MG PO TABS: 500.0000 mg | ORAL_TABLET | Freq: Two times a day (BID) | ORAL | 4 refills | Status: AC

## 2024-02-29 NOTE — Patient Instructions (Signed)
 Good to see you! Continue Levetiracetam  500mg  twice a day. Follow-up in 1 year, call for any changes.    Seizure Precautions: 1. If medication has been prescribed for you to prevent seizures, take it exactly as directed.  Do not stop taking the medicine without talking to your doctor first, even if you have not had a seizure in a long time.   2. Avoid activities in which a seizure would cause danger to yourself or to others.  Don't operate dangerous machinery, swim alone, or climb in high or dangerous places, such as on ladders, roofs, or girders.  Do not drive unless your doctor says you may.  3. If you have any warning that you may have a seizure, lay down in a safe place where you can't hurt yourself.    4.  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   Please refer to the following link on the [employer]ile[employer]'s website for more information: http://www.epilepsyfoundation.org/answerplace/Social/driving/drivingu.cfm   5.  Maintain good sleep hygiene. Avoid alcohol.  6.  Contact your doctor if you have any problems that may be related to the medicine you are taking.  7.  Call 911 and bring the patient back to the ED if:        A.  The seizure lasts longer than 5 minutes.       B.  The patient doesn't awaken shortly after the seizure  C.  The patient has new problems such as difficulty seeing, speaking or moving  D.  The patient was injured during the seizure  E.  The patient has a temperature over 102 F (39C)  F.  The patient vomited and now is having trouble breathing

## 2024-02-29 NOTE — Progress Notes (Signed)
 NEUROLOGY FOLLOW UP OFFICE NOTE  Ashley Guerrero [id number] [birth year]  HISTORY OF PRESENT ILLNESS: I had the pleasure of seeing Ashley Guerrero in follow-up in the neurology clinic on 02/29/2024.  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 She is alone in the office today.  Records and images were personally reviewed where available. Since her last visit, she continues to do well seizure-free since 03/2021 on Levetiracetam  500mg  BID, no side effects. She denies any staring/unresponsive episodes, gaps in time, olfactory/gustatory hallucinations, focal numbness/tingling/weakness, myoclonic jerks. No headaches, dizziness, vision changes, no falls. She gets 8 hours of sleep but still feels tired. She snores. She denies daytime drowsiness. She was found to have low vitamin D  and recently finished replacement therapy. Mood is good. She lives alone. She works at the [employer] 5-6 days a week.    History on Initial Assessment 04/04/2021: This is a very pleasant 58 year old right-handed woman with a history of hypertension, remote history of aortic dissection s/p aortic graft placement, presenting for new onset seizures. She was in her usual state of health until 01/29/21 while at work at [employer] when she was noted to be staring with nonsensical speech, unresponsive to questions. She was brought to the ER where she was noted to have clear but nonsensical speech. She was making complete sentences that were disconnected from the question asked. CT head negative. She started improving after brain MRI which did not show any acute changes. There was mild chronic microvascular disease and small remote right cerebellar infarct. She was amnestic of events and reported feeling foggy with word-finding difficulties. Her EEG showed left frontotemporal slowing and a spike in the left frontotemporal region.  She was discharged home on Levetiracetam  but did not start medication stating  she was in denial. She was back in the ER on 03/17/21 when she had another aphasic episode while talking to her niece on the phone. Her niece called EMS and they found her confused, smiling at them, talking about things at work. She had seizure-like activity en route and was given Midazolam . She woke up in the ER feeling exhausted, no focal weakness, tongue bite or incontinence. Her right shoulder felt sore. She denies any further seizures since starting Levetiracetam  500mg  BID. She lives alone. On further questioning, she reports that for the past month or so, she has been having episodes of deja vu where she felt she did something but had not done it. She denies any olfactory/gustatory hallucinations, deja vu, rising epigastric sensation, focal numbness/tingling/weakness, myoclonic jerks. She had a headache for 2-3 days after the recent seizure, none since. No dizziness, diplopia, dysarthria/dysphagia, neck/back pain, bowel/bladder dysfunction. Her mouth gets dry a lot. She has been feeling a little anxious, crying a lot, which is new since July/August (prior to starting LEV). She feels the Levetiracetam  is working for her. She notes mood is not good, people have noticed she is not as happy go-lucky, snapping at people since July/August. She reports taking Valium for anxiety in her 50s. Sleep is good. She denies any triggers to the recent seizures. Memory comes and goes, not that good. She used to remember everything but now has word-finding difficulties.   Epilepsy Risk Factors:  Maternal cousin has seizures. Otherwise she had a normal birth and early development.  There is no history of febrile convulsions, CNS infections such as meningitis/encephalitis, significant traumatic brain injury, neurosurgical procedures.  PAST MEDICAL HISTORY: Past Medical History:  Diagnosis Date  Aortic dissection (HCC)    Status post repair 5/16   Chronic diastolic CHF (congestive heart failure) (HCC)    Echo 6/16:   Severe LVH, EF 60-65%, no RWMA, Gr 1 DD, LA upper limits of normal, mild RAE, aneurysmal intra-atrial septum   Fibroid uterus    Hypertension     MEDICATIONS: Current Outpatient Medications on File Prior to Visit  Medication Sig Dispense Refill   acetaminophen  (TYLENOL ) 500 MG tablet Take 500 mg by mouth every 6 (six) hours as needed for mild pain.     amLODipine  (NORVASC ) 10 MG tablet Take 1 tablet (10 mg total) by mouth daily. 90 tablet 1   aspirin  EC 81 MG tablet Take 1 tablet (81 mg total) by mouth daily. 90 tablet 3   carvedilol  (COREG ) 25 MG tablet TAKE ONE TABLET BY MOUTH TWICE A DAY WITH A MEAL 180 tablet 1   cyclobenzaprine  (FLEXERIL ) 5 MG tablet TAKE ONE TABLET BY MOUTH THREE TIMES A DAY AS NEEDED FOR MUSCLE SPASMS 30 tablet 0   furosemide  (LASIX ) 20 MG tablet Take 1 tablet (20 mg total) by mouth as needed for edema. 90 tablet 1   hydrochlorothiazide  (HYDRODIURIL ) 25 MG tablet Take 1 tablet (25 mg total) by mouth daily. 90 tablet 1   levETIRAcetam  (KEPPRA ) 500 MG tablet TAKE ONE TABLET BY MOUTH TWICE A DAY 60 tablet 0   lisinopril  (ZESTRIL ) 40 MG tablet Take 1 tablet (40 mg total) by mouth daily. 90 tablet 1   potassium chloride  (KLOR-CON  M) 10 MEQ tablet Take 1 tablet (10 mEq total) by mouth daily. 90 tablet 1   potassium chloride  (KLOR-CON ) 10 MEQ tablet Take 1 tablet (10 mEq total) by mouth daily. 90 tablet 0   simvastatin  (ZOCOR ) 20 MG tablet Take 1 tablet (20 mg total) by mouth daily. 90 tablet 1   traMADol  (ULTRAM ) 50 MG tablet Take 50 mg by mouth every 6 (six) hours as needed for moderate pain.     Vitamin D , Ergocalciferol , (DRISDOL ) 1.25 MG (50000 UNIT) CAPS capsule Take 1 capsule (50,000 Units total) by mouth every 7 (seven) days. 12 capsule 0   No current facility-administered medications on file prior to visit.    ALLERGIES: Allergies  Allergen Reactions   Influenza Vaccines     Can tolerate egg free vaccine     FAMILY HISTORY: Family History  Problem Relation  Age of Onset   Hypertension Mother    Breast cancer Mother    Brain cancer Mother    Stroke Father    Heart failure Father    Hypertension Father    Diabetes Father    Hyperlipidemia Father    Hypertension Sister    Hypertension Brother    Cancer Sister    Breast cancer Maternal Aunt    Breast cancer Paternal Aunt    Breast cancer Maternal Grandmother    Breast cancer Paternal Grandmother    Heart attack Neg Hx     SOCIAL HISTORY: Social History   Socioeconomic History   Marital status: Single    Spouse name: Not on file   Number of children: 0   Years of education: Not on file   Highest education level: Associate degree: occupational, Scientist, product/process development, or vocational program  Occupational History   Not on file  Tobacco Use   Smoking status: Some Days    Current packs/day: 1.00    Average packs/day: 1 pack/day for 9.3 years (9.3 ttl pk-yrs)    Types: Cigarettes    Start  date: 11/03/2014   Smokeless tobacco: Never   Tobacco comments:    5 cig a week  Vaping Use   Vaping status: Never Used  Substance and Sexual Activity   Alcohol use: Not Currently   Drug use: Not Currently   Sexual activity: Not Currently  Other Topics Concern   Not on file  Social History Narrative   Right handed    Live with self in a one level home   Caffeine rare   Social Drivers of Health   Financial Resource Strain: Medium Risk (10/29/2023)   Overall Financial Resource Strain (CARDIA)    Difficulty of Paying Living Expenses: Somewhat hard  Food Insecurity: Food Insecurity Present (10/29/2023)   Hunger Vital Sign    Worried About Running Out of Food in the Last Year: Sometimes true    Ran Out of Food in the Last Year: Sometimes true  Transportation Needs: No Transportation Needs (10/29/2023)   PRAPARE - Administrator, Civil Service (Medical): No    Lack of Transportation (Non-Medical): No  Physical Activity: Insufficiently Active (10/29/2023)   Exercise Vital Sign    Days of  Exercise per Week: 2 days    Minutes of Exercise per Session: 20 min  Stress: Stress Concern Present (10/29/2023)   [employer] of Occupational Health - Occupational Stress Questionnaire    Feeling of Stress : To some extent  Social Connections: Moderately Isolated (10/29/2023)   Social Connection and Isolation Panel    Frequency of Communication with Friends and Family: Three times a week    Frequency of Social Gatherings with Friends and Family: Once a week    Attends Religious Services: 1 to 4 times per year    Active Member of [employer] or Organizations: No    Attends Engineer, structural: Not on file    Marital Status: Divorced  Intimate Partner Violence: Not At Risk (06/10/2023)   Humiliation, Afraid, Rape, and Kick questionnaire    Fear of Current or Ex-Partner: No    Emotionally Abused: No    Physically Abused: No    Sexually Abused: No     PHYSICAL EXAM: Vitals:   02/29/24 0825  BP: 133/88  Pulse: 68  Resp: 20  SpO2: 97%   General: No acute distress Head:  Normocephalic/atraumatic Skin/Extremities: No rash, no edema Neurological Exam: alert and awake. No aphasia or dysarthria. Fund of knowledge is appropriate.  Attention and concentration are normal.   Cranial nerves: Pupils equal, round. Extraocular movements intact with no nystagmus. Visual fields full.  No facial asymmetry.  Motor: Bulk and tone normal, muscle strength 5/5 throughout with no pronator drift.   Finger to nose testing intact.  Gait narrow-based and steady, able to tandem walk adequately.  Romberg negative.   IMPRESSION: This is a very pleasant 58 yo RH woman with a history of hypertension, remote history of aortic dissection s/p aortic graft placement, with left temporal lobe epilepsy. EEG showed slowing and epileptiform activity over the left frontotemporal region. MRI brain no acute changes. She has been seizure-free since 03/2021 on Levetiracetam  500mg  BID, refills sent. We discussed avoidance  of seizure triggers. She is aware of [place] driving laws to stop driving after a seizure until 6 months seizure-free. Follow-up in 1 year, call for any changes.   Thank you for allowing me to participate in her care.  Please do not hesitate to call for any questions or concerns.    Darice Shivers, M.D.   CC: Dr. Tanda

## 2024-04-29 ENCOUNTER — Other Ambulatory Visit: Payer: Self-pay | Admitting: Family Medicine

## 2024-04-29 DIAGNOSIS — Z95828 Presence of other vascular implants and grafts: Secondary | ICD-10-CM

## 2024-04-29 DIAGNOSIS — I10 Essential (primary) hypertension: Secondary | ICD-10-CM

## 2024-05-01 ENCOUNTER — Other Ambulatory Visit: Payer: Self-pay | Admitting: Family Medicine

## 2024-05-01 DIAGNOSIS — I10 Essential (primary) hypertension: Secondary | ICD-10-CM

## 2024-05-02 NOTE — Telephone Encounter (Signed)
 Requested Prescriptions  Pending Prescriptions Disp Refills   potassium chloride  (KLOR-CON  M) 10 MEQ tablet [Pharmacy Med Name: POTASSIUM CHLORIDE  ER 10 MEQ TAB] 90 tablet 0    Sig: TAKE ONE TABLET BY MOUTH ONE TIME DAILY     Endocrinology:  Minerals - Potassium Supplementation Passed - 05/02/2024  9:04 AM      Passed - K in normal range and within 360 days    Potassium  Date Value Ref Range Status  08/04/2023 4.3 3.5 - 5.2 mmol/L Final         Passed - Cr in normal range and within 360 days    Creatinine, Ser  Date Value Ref Range Status  08/04/2023 0.87 0.57 - 1.00 mg/dL Final         Passed - Valid encounter within last 12 months    Recent Outpatient Visits           6 months ago Essential (primary) hypertension   [place] [facility], [place]   9 months ago Annual physical exam   [place] [facility] [place], MD   10 months ago Essential hypertension   [place] [facility], [place], MD   1 year ago Essential hypertension   [place] [facility], MD   1 year ago Annual physical exam   [place] [facility], [place], MD               lisinopril  (ZESTRIL ) 40 MG tablet [Pharmacy Med Name: LISINOPRIL  40 MG TAB[*]] 90 tablet 0    Sig: TAKE ONE TABLET BY MOUTH ONE TIME DAILY     Cardiovascular:  ACE Inhibitors Failed - 05/02/2024  9:04 AM      Failed - Cr in normal range and within 180 days    Creatinine, Ser  Date Value Ref Range Status  08/04/2023 0.87 0.57 - 1.00 mg/dL Final         Failed - K in normal range and within 180 days    Potassium  Date Value Ref Range Status  08/04/2023 4.3 3.5 - 5.2 mmol/L Final         Failed - Valid encounter within last 6 months    Recent Outpatient Visits           6 months ago Essential (primary) hypertension   [place] [facility], MD    9 months ago Annual physical exam   [place] [facility], MD   10 months ago Essential hypertension   [place] [facility], MD   1 year ago Essential hypertension   [place] [facility], MD   1 year ago Annual physical exam   [place] [facility] [place], MD              Passed - Patient is not pregnant      Passed - Last BP in normal range    BP Readings from Last 1 Encounters:  02/29/24 133/88          amLODipine  (NORVASC ) 10 MG tablet [Pharmacy Med Name: AMLODIPINE  10 MG TAB] 90 tablet 0    Sig: TAKE ONE TABLET BY MOUTH ONE TIME DAILY     Cardiovascular: Calcium Channel Blockers 2 Failed - 05/02/2024  9:04 AM      Failed - Valid encounter within last 6 months    Recent Outpatient Visits           6 months ago Essential (primary) hypertension   [place] [facility], MD   9 months ago Annual physical exam   [place] [facility] [place], MD   10 months ago Essential hypertension   [place] [facility], MD   1 year ago Essential hypertension   [place] [facility] at [facility], MD   1 year ago Annual physical exam   [place] [facility] [place], MD              Passed - Last BP in normal range    BP Readings from Last 1 Encounters:  02/29/24 133/88         Passed - Last Heart Rate in normal range    Pulse Readings from Last 1 Encounters:  02/29/24 68          carvedilol  (COREG ) 25 MG tablet [Pharmacy Med Name: CARVEDILOL  25 MG TAB[*]] 180 tablet 0    Sig: TAKE ONE TABLET BY MOUTH TWICE A DAY WITH A MEAL     Cardiovascular: Beta Blockers 3 Failed - 05/02/2024  9:04 AM      Failed - Valid encounter within last 6 months    Recent Outpatient Visits           6  months ago Essential (primary) hypertension   [place] [facility], MD   9 months ago Annual physical exam   [place] [facility], [place], MD   10 months ago Essential hypertension   [place] [facility], MD   1 year ago Essential hypertension    [facility] at [facility], MD   1 year ago Annual physical exam    [facility] at [facility] [place], MD              Passed - Cr in normal range and within 360 days    Creatinine, Ser  Date Value Ref Range Status  08/04/2023 0.87 0.57 - 1.00 mg/dL Final         Passed - AST in normal range and within 360 days    AST  Date Value Ref Range Status  08/04/2023 18 0 - 40 IU/L Final         Passed - ALT in normal range and within 360 days    ALT  Date Value Ref Range Status  08/04/2023 22 0 - 32 IU/L Final         Passed - Last BP in normal range    BP Readings from Last 1 Encounters:  02/29/24 133/88         Passed - Last Heart Rate in normal range    Pulse Readings from Last 1 Encounters:  02/29/24 68          simvastatin  (ZOCOR ) 20 MG tablet [Pharmacy Med Name: SIMVASTATIN  20 MG TAB[*]] 90 tablet 0    Sig: TAKE ONE TABLET BY MOUTH ONE TIME DAILY     Cardiovascular:  Antilipid - Statins Failed - 05/02/2024  9:04 AM      Failed - Lipid Panel in normal range within the  last 12 months    Cholesterol, Total  Date Value Ref Range Status  08/04/2023 172 100 - 199 mg/dL Final   LDL Chol Calc (NIH)  Date Value Ref Range Status  08/04/2023 96 0 - 99 mg/dL Final   LDL Direct  Date Value Ref Range Status  05/09/2020 99 0 - 99 mg/dL Final   HDL  Date Value Ref Range Status  08/04/2023 43 >39 mg/dL Final   Triglycerides  Date Value Ref Range Status  08/04/2023 190 (H) 0 - 149 mg/dL Final         Passed - Patient is not pregnant      Passed - Valid encounter  within last 12 months    Recent Outpatient Visits           6 months ago Essential (primary) hypertension   [place] [facility], MD   9 months ago Annual physical exam   [place] [facility], MD   10 months ago Essential hypertension   [place] [facility], MD   1 year ago Essential hypertension   [place] [facility] at [facility], MD   1 year ago Annual physical exam   [place] [facility], MD

## 2024-05-03 ENCOUNTER — Ambulatory Visit: Admitting: Family Medicine

## 2024-05-03 ENCOUNTER — Encounter: Payer: Self-pay | Admitting: Family Medicine

## 2024-05-03 VITALS — BP 134/79 | HR 75 | Temp 99.4°F | Ht 66.0 in | Wt 199.8 lb

## 2024-05-03 DIAGNOSIS — Z6832 Body mass index (BMI) 32.0-32.9, adult: Secondary | ICD-10-CM

## 2024-05-03 DIAGNOSIS — I10 Essential (primary) hypertension: Secondary | ICD-10-CM

## 2024-05-03 DIAGNOSIS — G8929 Other chronic pain: Secondary | ICD-10-CM | POA: Diagnosis not present

## 2024-05-03 DIAGNOSIS — E6609 Other obesity due to excess calories: Secondary | ICD-10-CM

## 2024-05-03 DIAGNOSIS — F17200 Nicotine dependence, unspecified, uncomplicated: Secondary | ICD-10-CM | POA: Diagnosis not present

## 2024-05-03 DIAGNOSIS — E66811 Obesity, class 1: Secondary | ICD-10-CM

## 2024-05-03 DIAGNOSIS — J209 Acute bronchitis, unspecified: Secondary | ICD-10-CM

## 2024-05-03 DIAGNOSIS — E785 Hyperlipidemia, unspecified: Secondary | ICD-10-CM

## 2024-05-03 MED ORDER — AZITHROMYCIN 250 MG PO TABS: ORAL_TABLET | ORAL | 0 refills | Status: AC

## 2024-05-03 MED ORDER — CYCLOBENZAPRINE HCL 5 MG PO TABS: ORAL_TABLET | ORAL | 1 refills | Status: AC

## 2024-05-03 NOTE — Progress Notes (Unsigned)
 Established Patient Office Visit  Subjective    Patient ID: Ashley Guerrero, female    DOB: June 28, 1966  Age: 58 y.o. MRN: [id number]  CC:  Chief Complaint  Patient presents with   Medical Management of Chronic Issues    Pt also reports not feeling well with a cough, and head ache.     HPI [employer] presents for routine follow up hypertension. Patient reports med compliance. Patient also reports that she has cough productive or purulent sputum and low grade fever.   Outpatient Encounter Medications as of 05/03/2024  Medication Sig   acetaminophen  (TYLENOL ) 500 MG tablet Take 500 mg by mouth every 6 (six) hours as needed for mild pain.   amLODipine  (NORVASC ) 10 MG tablet TAKE ONE TABLET BY MOUTH ONE TIME DAILY   aspirin  EC 81 MG tablet Take 1 tablet (81 mg total) by mouth daily.   azithromycin (ZITHROMAX) 250 MG tablet Take 2 tablets on day 1, then 1 tablet daily on days 2 through 5   carvedilol  (COREG ) 25 MG tablet TAKE ONE TABLET BY MOUTH TWICE A DAY WITH A MEAL   furosemide  (LASIX ) 20 MG tablet Take 1 tablet (20 mg total) by mouth as needed for edema.   hydrochlorothiazide  (HYDRODIURIL ) 25 MG tablet Take 1 tablet (25 mg total) by mouth daily.   levETIRAcetam  (KEPPRA ) 500 MG tablet Take 1 tablet (500 mg total) by mouth 2 (two) times daily.   lisinopril  (ZESTRIL ) 40 MG tablet TAKE ONE TABLET BY MOUTH ONE TIME DAILY   potassium chloride  (KLOR-CON  M) 10 MEQ tablet TAKE ONE TABLET BY MOUTH ONE TIME DAILY   simvastatin  (ZOCOR ) 20 MG tablet TAKE ONE TABLET BY MOUTH ONE TIME DAILY   traMADol  (ULTRAM ) 50 MG tablet Take 50 mg by mouth every 6 (six) hours as needed for moderate pain.   [DISCONTINUED] cyclobenzaprine  (FLEXERIL ) 5 MG tablet TAKE ONE TABLET BY MOUTH THREE TIMES A DAY AS NEEDED FOR MUSCLE SPASMS   cyclobenzaprine  (FLEXERIL ) 5 MG tablet TAKE ONE TABLET BY MOUTH THREE TIMES A DAY AS NEEDED FOR MUSCLE SPASMS   potassium chloride  (KLOR-CON ) 10 MEQ tablet Take 1 tablet (10 mEq  total) by mouth daily.   Vitamin D , Ergocalciferol , (DRISDOL ) 1.25 MG (50000 UNIT) CAPS capsule Take 1 capsule (50,000 Units total) by mouth every 7 (seven) days.   No facility-administered encounter medications on file as of 05/03/2024.    Past Medical History:  Diagnosis Date   Aortic dissection (HCC)    Status post repair 5/16   Chronic diastolic CHF (congestive heart failure) (HCC)    Echo 6/16:  Severe LVH, EF 60-65%, no RWMA, Gr 1 DD, LA upper limits of normal, mild RAE, aneurysmal intra-atrial septum   Fibroid uterus    Hypertension     Past Surgical History:  Procedure Laterality Date   REPLACEMENT ASCENDING AORTA N/A 11/03/2014   Procedure: REPLACEMENT ASCENDING AORTA with a 30mm hemashield graft,  resuspension of aortic valve, hypothermic circulatory arrest and cardiopulmonary bypass.;  Surgeon: Dallas KATHEE Jude, MD;  Location: [facility] OR;  Service: Open Heart Surgery;  Laterality: N/A;    Family History  Problem Relation Age of Onset   Hypertension Mother    Breast cancer Mother    Brain cancer Mother    Stroke Father    Heart failure Father    Hypertension Father    Diabetes Father    Hyperlipidemia Father    Hypertension Sister    Hypertension Brother    Cancer Sister  Breast cancer Maternal Aunt    Breast cancer Paternal Aunt    Breast cancer Maternal Grandmother    Breast cancer Paternal Grandmother    Heart attack Neg Hx     Social History   Socioeconomic History   Marital status: Single    Spouse name: Not on file   Number of children: 0   Years of education: Not on file   Highest education level: Associate degree: occupational, scientist, product/process development, or vocational program  Occupational History   Not on file  Tobacco Use   Smoking status: Some Days    Current packs/day: 1.00    Average packs/day: 1 pack/day for 9.5 years (9.5 ttl pk-yrs)    Types: Cigarettes    Start date: 11/03/2014   Smokeless tobacco: Never   Tobacco comments:    5 cig a week  Vaping  Use   Vaping status: Never Used  Substance and Sexual Activity   Alcohol use: Not Currently   Drug use: Not Currently   Sexual activity: Not Currently  Other Topics Concern   Not on file  Social History Narrative   Right handed    Live with self in a one level home   Caffeine rare   Social Drivers of Health   Financial Resource Strain: Medium Risk (10/29/2023)   Overall Financial Resource Strain (CARDIA)    Difficulty of Paying Living Expenses: Somewhat hard  Food Insecurity: Food Insecurity Present (10/29/2023)   Hunger Vital Sign    Worried About Running Out of Food in the Last Year: Sometimes true    Ran Out of Food in the Last Year: Sometimes true  Transportation Needs: No Transportation Needs (10/29/2023)   PRAPARE - Administrator, Civil Service (Medical): No    Lack of Transportation (Non-Medical): No  Physical Activity: Insufficiently Active (10/29/2023)   Exercise Vital Sign    Days of Exercise per Week: 2 days    Minutes of Exercise per Session: 20 min  Stress: Stress Concern Present (10/29/2023)   [employer] of Occupational Health - Occupational Stress Questionnaire    Feeling of Stress : To some extent  Social Connections: Moderately Isolated (10/29/2023)   Social Connection and Isolation Panel    Frequency of Communication with Friends and Family: Three times a week    Frequency of Social Gatherings with Friends and Family: Once a week    Attends Religious Services: 1 to 4 times per year    Active Member of [employer] or Organizations: No    Attends Engineer, Structural: Not on file    Marital Status: Divorced  Intimate Partner Violence: Not At Risk (06/10/2023)   Humiliation, Afraid, Rape, and Kick questionnaire    Fear of Current or Ex-Partner: No    Emotionally Abused: No    Physically Abused: No    Sexually Abused: No    Review of Systems  All other systems reviewed and are negative.       Objective    BP 134/79   Pulse 75    Temp 99.4 F (37.4 C)   Ht 5' 6 (1.676 m)   Wt 199 lb 12.8 oz (90.6 kg)   LMP 10/29/2014   SpO2 95%   BMI 32.25 kg/m   Physical Exam Vitals and nursing note reviewed.  Constitutional:      General: She is not in acute distress.    Appearance: She is obese.  Cardiovascular:     Rate and Rhythm: Normal rate and regular rhythm.  Pulmonary:     Effort: Pulmonary effort is normal.     Breath sounds: Normal breath sounds.  Abdominal:     Palpations: Abdomen is soft.     Tenderness: There is no abdominal tenderness.  Neurological:     General: No focal deficit present.     Mental Status: She is alert and oriented to person, place, and time.     {Labs (Optional):23779}    Assessment & Plan:   1. Essential hypertension (Primary) Appears stable. Continue   2. Acute bronchitis, unspecified organism Viral screen neg. Zithromax prescribed.  - POC Covid19/Flu A&B Antigen  3. Other chronic pain  - cyclobenzaprine  (FLEXERIL ) 5 MG tablet; TAKE ONE TABLET BY MOUTH THREE TIMES A DAY AS NEEDED FOR MUSCLE SPASMS  Dispense: 60 tablet; Refill: 1  4. Tobacco use disorder Encouraged patient to end smoking the occasional cigareete.   5. Class 1 obesity due to excess calories with serious comorbidity and body mass index (BMI) of 32.0 to 32.9 in adult   6. Hyperlipidemia, unspecified hyperlipidemia type Continue     Return in about 6 months (around 11/01/2024) for follow up.   Tanda Raguel SQUIBB, MD

## 2024-05-04 ENCOUNTER — Ambulatory Visit: Payer: Self-pay | Admitting: Family Medicine

## 2024-05-04 LAB — POC COVID19/FLU A&B COMBO
Covid Antigen, POC: NEGATIVE
Influenza A Antigen, POC: NEGATIVE
Influenza B Antigen, POC: NEGATIVE

## 2024-05-05 ENCOUNTER — Ambulatory Visit: Payer: Self-pay

## 2024-05-05 NOTE — Telephone Encounter (Signed)
 FYI Only or Action Required?: Action required by provider: clinical question for provider.  Patient was last seen in primary care on 05/03/2024 by Tanda Bleacher, MD.  Called Nurse Triage reporting Fever and Nasal Congestion.  Symptoms began several days ago.  Interventions attempted: Prescription medications: zithromax and Rest, hydration, or home remedies.  Symptoms are: gradually worsening.  Triage Disposition: See HCP Within 4 Hours (Or PCP Triage)  Patient/caregiver understands and will follow disposition?: Yes  Copied from CRM [id number]. Topic: Clinical - Red Word Triage >> May 05, 2024 10:19 AM Gustabo D wrote: Pt was given antibiotics from her pcp and says she's getting worse and has a fever of 102 this morning. She feels awful and can't lay down at night. Reason for Disposition  [1] Taking antibiotics > 24 hours AND [2] symptoms WORSE  [1] MILD difficulty breathing (e.g., minimal/no SOB at rest, SOB with walking, pulse < 100) AND [2] still present when not coughing  Answer Assessment - Initial Assessment Questions Additional info: Patient requesting follow up with pcp clinic, no appointment are available until Monday, offered visit today at alternate office but she declines and states she will proceed to urgent care [place]. Patient states she is unsure if pcp would want her to go to urgent care spreading germs around explained to patient with progression of symptoms, worsening congestion, breathing difficulty when laying flat despite being on antibiotics that it would be prudent for assessment of her respiratory status to determine need for further testing/treatment. Patient in agreement but would like to know from pcp if she agree's with UC today? Please advise    1. INFECTION: What infection is the antibiotic being given for?     bronchitis 2. ANTIBIOTIC: What antibiotic are you taking How many times per day?     doxy 3. DURATION: When was the antibiotic  started?     10/29 4. MAIN CONCERN OR SYMPTOM:  What is your main concern right now?     Fever, chest congestion  5. BETTER-SAME-WORSE: Are you getting better, staying the same, or getting worse compared to when you first started the antibiotics? If getting worse, ask: In what way?      worse 6. FEVER: Do you have a fever? If Yes, ask: What is your temperature, how was it measured, and when did it start?     102.0 7. SYMPTOMS: Are there any other symptoms you're concerned about? If Yes, ask: When did it start?     Cough, breathing difficulty when laying flat.  Protocols used: Infection on Antibiotic Follow-up Call-A-AH, Cough - Acute Productive-A-AH

## 2024-05-05 NOTE — Telephone Encounter (Signed)
 Pt at UC now and I went and spoke to her

## 2024-05-24 ENCOUNTER — Encounter: Payer: Self-pay | Admitting: Neurology

## 2024-06-04 ENCOUNTER — Other Ambulatory Visit: Payer: Self-pay | Admitting: Family Medicine

## 2024-06-04 DIAGNOSIS — I10 Essential (primary) hypertension: Secondary | ICD-10-CM

## 2024-06-04 DIAGNOSIS — Z95828 Presence of other vascular implants and grafts: Secondary | ICD-10-CM

## 2024-07-26 ENCOUNTER — Other Ambulatory Visit: Payer: Self-pay | Admitting: Family Medicine

## 2024-07-26 DIAGNOSIS — I10 Essential (primary) hypertension: Secondary | ICD-10-CM

## 2024-07-26 DIAGNOSIS — Z95828 Presence of other vascular implants and grafts: Secondary | ICD-10-CM

## 2024-07-27 NOTE — Telephone Encounter (Signed)
 Requested Prescriptions  Pending Prescriptions Disp Refills   amLODipine  (NORVASC ) 10 MG tablet [Pharmacy Med Name: AMLODIPINE  10 MG TAB] 90 tablet 1    Sig: TAKE ONE TABLET BY MOUTH ONE TIME DAILY     Cardiovascular: Calcium Channel Blockers 2 Passed - 07/27/2024 10:56 AM      Passed - Last BP in normal range    BP Readings from Last 1 Encounters:  05/03/24 134/79         Passed - Last Heart Rate in normal range    Pulse Readings from Last 1 Encounters:  05/03/24 75         Passed - Valid encounter within last 6 months    Recent Outpatient Visits           2 months ago Essential hypertension   [place] [facility] [place], [place]   8 months ago Essential (primary) hypertension   [place] [facility], MD   11 months ago Annual physical exam   [place] [facility], MD   1 year ago Essential hypertension   [place] [facility], MD   1 year ago Essential hypertension   [place] [facility] at [facility], [place], MD               carvedilol  (COREG ) 25 MG tablet [Pharmacy Med Name: CARVEDILOL  25 MG TAB[*]] 180 tablet 1    Sig: TAKE ONE TABLET BY MOUTH TWICE A DAY WITH A MEAL     Cardiovascular: Beta Blockers 3 Passed - 07/27/2024 10:56 AM      Passed - Cr in normal range and within 360 days    Creatinine, Ser  Date Value Ref Range Status  08/04/2023 0.87 0.57 - 1.00 mg/dL Final         Passed - AST in normal range and within 360 days    AST  Date Value Ref Range Status  08/04/2023 18 0 - 40 IU/L Final         Passed - ALT in normal range and within 360 days    ALT  Date Value Ref Range Status  08/04/2023 22 0 - 32 IU/L Final         Passed - Last BP in normal range    BP Readings from Last 1 Encounters:  05/03/24 134/79         Passed - Last Heart Rate in normal range    Pulse Readings from Last 1 Encounters:   05/03/24 75         Passed - Valid encounter within last 6 months    Recent Outpatient Visits           2 months ago Essential hypertension   [place] [facility], MD   8 months ago Essential (primary) hypertension   [place] [facility], MD   11 months ago Annual physical exam   [place] [facility], MD   1 year ago Essential hypertension   [place] [facility], MD   1 year ago Essential hypertension    [facility] at [facility], MD               lisinopril  (ZESTRIL ) 40 MG tablet [Pharmacy Med Name: LISINOPRIL  40 MG  TAB[*]] 90 tablet 1    Sig: TAKE ONE TABLET BY MOUTH ONE TIME DAILY     Cardiovascular:  ACE Inhibitors Failed - 07/27/2024 10:56 AM      Failed - Cr in normal range and within 180 days    Creatinine, Ser  Date Value Ref Range Status  08/04/2023 0.87 0.57 - 1.00 mg/dL Final         Failed - K in normal range and within 180 days    Potassium  Date Value Ref Range Status  08/04/2023 4.3 3.5 - 5.2 mmol/L Final         Passed - Patient is not pregnant      Passed - Last BP in normal range    BP Readings from Last 1 Encounters:  05/03/24 134/79         Passed - Valid encounter within last 6 months    Recent Outpatient Visits           2 months ago Essential hypertension   [place] [facility], MD   8 months ago Essential (primary) hypertension   [place] [facility], MD   11 months ago Annual physical exam   [place] [facility], MD   1 year ago Essential hypertension   [place] [facility], MD   1 year ago Essential hypertension   [place] [facility], [place], MD               simvastatin   (ZOCOR ) 20 MG tablet [Pharmacy Med Name: SIMVASTATIN  20 MG TAB[*]] 90 tablet 0    Sig: TAKE ONE TABLET BY MOUTH ONE TIME DAILY     Cardiovascular:  Antilipid - Statins Failed - 07/27/2024 10:56 AM      Failed - Lipid Panel in normal range within the last 12 months    Cholesterol, Total  Date Value Ref Range Status  08/04/2023 172 100 - 199 mg/dL Final   LDL Chol Calc (NIH)  Date Value Ref Range Status  08/04/2023 96 0 - 99 mg/dL Final   LDL Direct  Date Value Ref Range Status  05/09/2020 99 0 - 99 mg/dL Final   HDL  Date Value Ref Range Status  08/04/2023 43 >39 mg/dL Final   Triglycerides  Date Value Ref Range Status  08/04/2023 190 (H) 0 - 149 mg/dL Final         Passed - Patient is not pregnant      Passed - Valid encounter within last 12 months    Recent Outpatient Visits           2 months ago Essential hypertension   [place] [facility], [place]   8 months ago Essential (primary) hypertension   [place] [facility], [place]   11 months ago Annual physical exam   [place] [facility], MD   1 year ago Essential hypertension   [place] [facility] at [facility], MD   1 year ago Essential hypertension    Primary Care at [facility], MD

## 2025-02-28 ENCOUNTER — Ambulatory Visit: Admitting: Neurology
